# Patient Record
Sex: Female | Born: 1950
Health system: Southern US, Community
[De-identification: ages and names within clinical notes are randomized; demographics above are authoritative.]

## PROBLEM LIST (undated history)

## (undated) DIAGNOSIS — J40 Bronchitis, not specified as acute or chronic: Secondary | ICD-10-CM

## (undated) DIAGNOSIS — Z86018 Personal history of other benign neoplasm: Secondary | ICD-10-CM

## (undated) DIAGNOSIS — D219 Benign neoplasm of connective and other soft tissue, unspecified: Secondary | ICD-10-CM

## (undated) DIAGNOSIS — A64 Unspecified sexually transmitted disease: Secondary | ICD-10-CM

## (undated) DIAGNOSIS — T148XXA Other injury of unspecified body region, initial encounter: Secondary | ICD-10-CM

## (undated) DIAGNOSIS — H269 Unspecified cataract: Secondary | ICD-10-CM

## (undated) DIAGNOSIS — D649 Anemia, unspecified: Secondary | ICD-10-CM

## (undated) DIAGNOSIS — A159 Respiratory tuberculosis unspecified: Secondary | ICD-10-CM

## (undated) DIAGNOSIS — Z8619 Personal history of other infectious and parasitic diseases: Secondary | ICD-10-CM

## (undated) DIAGNOSIS — F419 Anxiety disorder, unspecified: Secondary | ICD-10-CM

## (undated) HISTORY — DX: Anemia, unspecified: D64.9

## (undated) HISTORY — DX: Personal history of other benign neoplasm: Z86.018

## (undated) HISTORY — PX: EYE SURGERY: SHX253

## (undated) HISTORY — DX: Personal history of other infectious and parasitic diseases: Z86.19

## (undated) HISTORY — DX: Unspecified sexually transmitted disease: A64

## (undated) HISTORY — DX: Unspecified cataract: H26.9

## (undated) HISTORY — DX: Anxiety disorder, unspecified: F41.9

## (undated) HISTORY — DX: Respiratory tuberculosis unspecified: A15.9

## (undated) HISTORY — DX: Benign neoplasm of connective and other soft tissue, unspecified: D21.9

## (undated) HISTORY — DX: Bronchitis, not specified as acute or chronic: J40

---

## 1997-08-29 ENCOUNTER — Emergency Department (HOSPITAL_COMMUNITY): Admission: EM | Admit: 1997-08-29 | Discharge: 1997-08-29 | Payer: Self-pay | Admitting: Emergency Medicine

## 1997-09-06 ENCOUNTER — Emergency Department (HOSPITAL_COMMUNITY): Admission: EM | Admit: 1997-09-06 | Discharge: 1997-09-06 | Payer: Self-pay | Admitting: Emergency Medicine

## 1997-12-30 ENCOUNTER — Ambulatory Visit: Admission: RE | Admit: 1997-12-30 | Discharge: 1997-12-30 | Payer: Self-pay | Admitting: Internal Medicine

## 1998-05-14 HISTORY — PX: OTHER SURGICAL HISTORY: SHX169

## 1998-10-19 ENCOUNTER — Other Ambulatory Visit: Admission: RE | Admit: 1998-10-19 | Discharge: 1998-10-19 | Payer: Self-pay | Admitting: Obstetrics and Gynecology

## 1999-01-06 ENCOUNTER — Encounter (INDEPENDENT_AMBULATORY_CARE_PROVIDER_SITE_OTHER): Payer: Self-pay | Admitting: Specialist

## 1999-01-06 ENCOUNTER — Ambulatory Visit (HOSPITAL_COMMUNITY): Admission: RE | Admit: 1999-01-06 | Discharge: 1999-01-06 | Payer: Self-pay | Admitting: Obstetrics and Gynecology

## 1999-04-29 ENCOUNTER — Emergency Department (HOSPITAL_COMMUNITY): Admission: EM | Admit: 1999-04-29 | Discharge: 1999-04-29 | Payer: Self-pay | Admitting: Emergency Medicine

## 1999-05-02 ENCOUNTER — Encounter: Admission: RE | Admit: 1999-05-02 | Discharge: 1999-05-10 | Payer: Self-pay | Admitting: Geriatric Medicine

## 1999-11-01 ENCOUNTER — Other Ambulatory Visit: Admission: RE | Admit: 1999-11-01 | Discharge: 1999-11-01 | Payer: Self-pay | Admitting: Obstetrics and Gynecology

## 1999-12-12 ENCOUNTER — Encounter: Payer: Self-pay | Admitting: Internal Medicine

## 1999-12-12 ENCOUNTER — Encounter: Admission: RE | Admit: 1999-12-12 | Discharge: 1999-12-12 | Payer: Self-pay | Admitting: Internal Medicine

## 2000-05-22 ENCOUNTER — Encounter: Admission: RE | Admit: 2000-05-22 | Discharge: 2000-05-22 | Payer: Self-pay | Admitting: *Deleted

## 2000-11-19 ENCOUNTER — Other Ambulatory Visit: Admission: RE | Admit: 2000-11-19 | Discharge: 2000-11-19 | Payer: Self-pay | Admitting: Obstetrics and Gynecology

## 2000-12-12 ENCOUNTER — Encounter: Payer: Self-pay | Admitting: Internal Medicine

## 2000-12-12 ENCOUNTER — Encounter: Admission: RE | Admit: 2000-12-12 | Discharge: 2000-12-12 | Payer: Self-pay | Admitting: Internal Medicine

## 2001-05-14 HISTORY — PX: FRACTURE SURGERY: SHX138

## 2001-08-29 ENCOUNTER — Encounter: Admission: RE | Admit: 2001-08-29 | Discharge: 2001-09-09 | Payer: Self-pay | Admitting: Internal Medicine

## 2001-10-28 ENCOUNTER — Inpatient Hospital Stay (HOSPITAL_COMMUNITY): Admission: AC | Admit: 2001-10-28 | Discharge: 2001-10-29 | Payer: Self-pay | Admitting: *Deleted

## 2001-10-28 ENCOUNTER — Encounter: Payer: Self-pay | Admitting: *Deleted

## 2001-10-28 ENCOUNTER — Encounter: Payer: Self-pay | Admitting: Orthopedic Surgery

## 2001-11-11 ENCOUNTER — Encounter: Payer: Self-pay | Admitting: Orthopedic Surgery

## 2001-11-12 ENCOUNTER — Inpatient Hospital Stay (HOSPITAL_COMMUNITY): Admission: RE | Admit: 2001-11-12 | Discharge: 2001-11-17 | Payer: Self-pay | Admitting: Orthopedic Surgery

## 2001-11-12 ENCOUNTER — Encounter: Payer: Self-pay | Admitting: Orthopedic Surgery

## 2001-12-30 ENCOUNTER — Other Ambulatory Visit: Admission: RE | Admit: 2001-12-30 | Discharge: 2001-12-30 | Payer: Self-pay | Admitting: Obstetrics and Gynecology

## 2002-01-08 ENCOUNTER — Encounter: Payer: Self-pay | Admitting: Obstetrics and Gynecology

## 2002-01-08 ENCOUNTER — Ambulatory Visit (HOSPITAL_COMMUNITY): Admission: RE | Admit: 2002-01-08 | Discharge: 2002-01-08 | Payer: Self-pay | Admitting: Obstetrics and Gynecology

## 2003-04-27 ENCOUNTER — Ambulatory Visit (HOSPITAL_COMMUNITY): Admission: RE | Admit: 2003-04-27 | Discharge: 2003-04-27 | Payer: Self-pay | Admitting: Obstetrics and Gynecology

## 2004-04-10 ENCOUNTER — Ambulatory Visit (HOSPITAL_COMMUNITY): Admission: RE | Admit: 2004-04-10 | Discharge: 2004-04-10 | Payer: Self-pay | Admitting: Gastroenterology

## 2004-05-16 ENCOUNTER — Ambulatory Visit (HOSPITAL_COMMUNITY): Admission: RE | Admit: 2004-05-16 | Discharge: 2004-05-16 | Payer: Self-pay | Admitting: Obstetrics and Gynecology

## 2005-07-18 ENCOUNTER — Ambulatory Visit (HOSPITAL_COMMUNITY): Admission: RE | Admit: 2005-07-18 | Discharge: 2005-07-18 | Payer: Self-pay | Admitting: Obstetrics and Gynecology

## 2006-08-07 ENCOUNTER — Ambulatory Visit (HOSPITAL_COMMUNITY): Admission: RE | Admit: 2006-08-07 | Discharge: 2006-08-07 | Payer: Self-pay | Admitting: Obstetrics and Gynecology

## 2007-08-28 ENCOUNTER — Ambulatory Visit (HOSPITAL_COMMUNITY): Admission: RE | Admit: 2007-08-28 | Discharge: 2007-08-28 | Payer: Self-pay | Admitting: Obstetrics and Gynecology

## 2008-09-04 ENCOUNTER — Emergency Department (HOSPITAL_COMMUNITY): Admission: EM | Admit: 2008-09-04 | Discharge: 2008-09-04 | Payer: Self-pay | Admitting: Family Medicine

## 2008-10-13 ENCOUNTER — Ambulatory Visit (HOSPITAL_COMMUNITY): Admission: RE | Admit: 2008-10-13 | Discharge: 2008-10-13 | Payer: Self-pay | Admitting: Obstetrics and Gynecology

## 2009-08-12 ENCOUNTER — Ambulatory Visit: Payer: Self-pay | Admitting: Internal Medicine

## 2009-09-15 ENCOUNTER — Ambulatory Visit: Payer: Self-pay | Admitting: Internal Medicine

## 2010-03-13 ENCOUNTER — Ambulatory Visit: Payer: Self-pay | Admitting: Internal Medicine

## 2010-03-16 ENCOUNTER — Ambulatory Visit (HOSPITAL_COMMUNITY): Admission: RE | Admit: 2010-03-16 | Discharge: 2010-03-16 | Payer: Self-pay | Admitting: Geriatric Medicine

## 2010-03-28 ENCOUNTER — Encounter: Admission: RE | Admit: 2010-03-28 | Discharge: 2010-03-28 | Payer: Self-pay | Admitting: Obstetrics and Gynecology

## 2010-04-25 ENCOUNTER — Encounter (INDEPENDENT_AMBULATORY_CARE_PROVIDER_SITE_OTHER): Payer: Self-pay | Admitting: *Deleted

## 2010-04-25 LAB — CONVERTED CEMR LAB
Albumin: 4.4 g/dL (ref 3.5–5.2)
Alkaline Phosphatase: 48 units/L (ref 39–117)
Basophils Relative: 0 % (ref 0–1)
CO2: 27 meq/L (ref 19–32)
Chloride: 103 meq/L (ref 96–112)
Glucose, Bld: 87 mg/dL (ref 70–99)
Lymphocytes Relative: 44 % (ref 12–46)
Lymphs Abs: 2.1 10*3/uL (ref 0.7–4.0)
Monocytes Relative: 12 % (ref 3–12)
Neutro Abs: 2 10*3/uL (ref 1.7–7.7)
Neutrophils Relative %: 41 % — ABNORMAL LOW (ref 43–77)
Potassium: 3.9 meq/L (ref 3.5–5.3)
RBC: 4.43 M/uL (ref 3.87–5.11)
Rhuematoid fact SerPl-aCnc: <20 intl units/mL (ref 0–20)
Sodium: 139 meq/L (ref 135–145)
Total Protein: 7.3 g/dL (ref 6.0–8.3)
WBC: 4.9 10*3/uL (ref 4.0–10.5)

## 2010-06-03 ENCOUNTER — Encounter: Payer: Self-pay | Admitting: Obstetrics and Gynecology

## 2010-06-04 ENCOUNTER — Encounter: Payer: Self-pay | Admitting: Obstetrics and Gynecology
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
## 2010-09-29 NOTE — Op Note (Signed)
Emily [name], Emily [name]                ACCOUNT NO.:  [id number]   MEDICAL RECORD NO.:  [id number]          PATIENT TYPE:  AMB   LOCATION:  ENDO                         FACILITY:  [facility]   PHYSICIAN:  Danise Edge, M.D.   DATE OF BIRTH:  [birth year]   DATE OF PROCEDURE:  04/10/2004  DATE OF DISCHARGE:                                 OPERATIVE REPORT   PROCEDURE PERFORMED:  Colonoscopy.   ENDOSCOPIST:  Charolett Bumpers, M.D.   INDICATIONS FOR PROCEDURE:  Ms. Emily [name] is a 60 year old female born  Jan 26, 1951.  Ms. Emily [name] is scheduled to undergo her first screening  colonoscopy with polypectomy to prevent colon cancer.   PREMEDICATION:  Versed 4 mg, Demerol 50 mg.   DESCRIPTION OF PROCEDURE:  After obtaining informed consent, the patient was  placed in the left lateral decubitus position.  I administered intravenous  Demerol and intravenous Versed to achieve conscious sedation for the  procedure.  The patient's blood pressure, oxygen saturations and cardiac  rhythm were monitored throughout the procedure and documented in the medical  record.   Anal inspection was normal.  Digital rectal exam was normal.  The pediatric  Olympus adjustable colonoscope was introduced into the rectum and advanced  to the cecum.  Colonic preparation for the exam today was excellent.   Rectum:  Normal.   Sigmoid colon and descending colon:  Normal.   Splenic flexure:  Normal.   Transverse colon:  Normal.   Hepatic flexure:  Normal.   Ascending colon:  Normal.   Cecum and ileocecal valve:  Normal.   ASSESSMENT:  Normal proctocolonoscopy to the cecum.  No endoscopic evidence  for the presence of colorectal neoplasia.      MJ/MEDQ  D:  04/10/2004  T:  04/10/2004  Job:  [id number]   cc:   Georgann Housekeeper, MD  [address]  [place]  [place] [address]  Fax: [phone]   Maxie Better, M.D.  [address]  [place]  [place] [address]  Fax: [phone]
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
## 2010-11-13 ENCOUNTER — Inpatient Hospital Stay (INDEPENDENT_AMBULATORY_CARE_PROVIDER_SITE_OTHER)
Admission: RE | Admit: 2010-11-13 | Discharge: 2010-11-13 | Disposition: A | Discharge status: Home / Self Care | Payer: Self-pay | Source: Ambulatory Visit | Attending: Family Medicine | Admitting: Family Medicine

## 2010-11-13 ENCOUNTER — Emergency Department (HOSPITAL_COMMUNITY): Payer: Self-pay

## 2010-11-13 ENCOUNTER — Observation Stay (HOSPITAL_COMMUNITY)
Admission: EM | Admit: 2010-11-13 | Discharge: 2010-11-14 | Disposition: A | Discharge status: Home / Self Care | Payer: Self-pay | Attending: Emergency Medicine | Admitting: Emergency Medicine

## 2010-11-13 DIAGNOSIS — R079 Chest pain, unspecified: Secondary | ICD-10-CM

## 2010-11-13 LAB — POCT I-STAT, CHEM 8
BUN: 23 mg/dL (ref 6–23)
Calcium, Ion: 1.09 mmol/L — ABNORMAL LOW (ref 1.12–1.32)
TCO2: 20 mmol/L (ref 0–100)

## 2010-11-13 LAB — CK TOTAL AND CKMB (NOT AT ARMC): CK, MB: 3.2 ng/mL (ref 0.3–4.0)

## 2010-11-13 LAB — CBC
MCH: 27.4 pg (ref 26.0–34.0)
MCV: 81 fL (ref 78.0–100.0)
Platelets: 231 10*3/uL (ref 150–400)
RDW: 14.2 % (ref 11.5–15.5)
WBC: 5.5 10*3/uL (ref 4.0–10.5)

## 2010-11-14 DIAGNOSIS — R072 Precordial pain: Secondary | ICD-10-CM

## 2010-11-16 ENCOUNTER — Inpatient Hospital Stay (INDEPENDENT_AMBULATORY_CARE_PROVIDER_SITE_OTHER)
Admission: RE | Admit: 2010-11-16 | Discharge: 2010-11-16 | Disposition: A | Discharge status: Home / Self Care | Payer: Self-pay | Source: Ambulatory Visit | Attending: Family Medicine | Admitting: Family Medicine

## 2010-11-16 ENCOUNTER — Ambulatory Visit (INDEPENDENT_AMBULATORY_CARE_PROVIDER_SITE_OTHER): Payer: Self-pay

## 2010-11-16 DIAGNOSIS — M79609 Pain in unspecified limb: Secondary | ICD-10-CM

## 2010-12-11 ENCOUNTER — Other Ambulatory Visit: Payer: Self-pay | Admitting: Family Medicine

## 2010-12-11 ENCOUNTER — Other Ambulatory Visit (HOSPITAL_COMMUNITY): Payer: Self-pay | Admitting: Family Medicine

## 2010-12-11 DIAGNOSIS — Z1231 Encounter for screening mammogram for malignant neoplasm of breast: Secondary | ICD-10-CM

## 2010-12-15 ENCOUNTER — Ambulatory Visit (HOSPITAL_COMMUNITY): Payer: Self-pay

## 2011-01-16 ENCOUNTER — Other Ambulatory Visit (HOSPITAL_COMMUNITY): Payer: Self-pay | Admitting: Family Medicine

## 2011-01-16 DIAGNOSIS — Z780 Asymptomatic menopausal state: Secondary | ICD-10-CM

## 2011-03-19 ENCOUNTER — Ambulatory Visit (HOSPITAL_COMMUNITY): Payer: Self-pay

## 2011-03-19 ENCOUNTER — Ambulatory Visit (HOSPITAL_COMMUNITY)
Admission: RE | Admit: 2011-03-19 | Discharge: 2011-03-19 | Disposition: A | Discharge status: Home / Self Care | Payer: Self-pay | Source: Ambulatory Visit | Attending: Family Medicine | Admitting: Family Medicine

## 2011-03-19 DIAGNOSIS — Z1231 Encounter for screening mammogram for malignant neoplasm of breast: Secondary | ICD-10-CM

## 2011-03-19 DIAGNOSIS — Z13820 Encounter for screening for osteoporosis: Secondary | ICD-10-CM | POA: Insufficient documentation

## 2011-03-19 DIAGNOSIS — Z780 Asymptomatic menopausal state: Secondary | ICD-10-CM

## 2011-05-25 ENCOUNTER — Ambulatory Visit: Payer: Self-pay | Admitting: Physical Therapy

## 2011-06-01 ENCOUNTER — Ambulatory Visit: Payer: Self-pay

## 2011-06-04 ENCOUNTER — Ambulatory Visit: Payer: Self-pay | Attending: Family Medicine | Admitting: Physical Therapy

## 2011-06-04 DIAGNOSIS — IMO0001 Reserved for inherently not codable concepts without codable children: Secondary | ICD-10-CM | POA: Insufficient documentation

## 2011-06-04 DIAGNOSIS — R5381 Other malaise: Secondary | ICD-10-CM | POA: Insufficient documentation

## 2011-06-04 DIAGNOSIS — M25549 Pain in joints of unspecified hand: Secondary | ICD-10-CM | POA: Insufficient documentation

## 2011-06-07 ENCOUNTER — Ambulatory Visit: Payer: Self-pay | Admitting: Physical Therapy

## 2011-06-12 ENCOUNTER — Ambulatory Visit: Payer: Self-pay | Admitting: Physical Therapy

## 2011-06-14 ENCOUNTER — Ambulatory Visit: Payer: Self-pay | Admitting: Physical Therapy

## 2011-06-19 ENCOUNTER — Ambulatory Visit: Payer: Self-pay | Attending: Family Medicine | Admitting: Physical Therapy

## 2011-06-19 DIAGNOSIS — R5381 Other malaise: Secondary | ICD-10-CM | POA: Insufficient documentation

## 2011-06-19 DIAGNOSIS — IMO0001 Reserved for inherently not codable concepts without codable children: Secondary | ICD-10-CM | POA: Insufficient documentation

## 2011-06-19 DIAGNOSIS — M25549 Pain in joints of unspecified hand: Secondary | ICD-10-CM | POA: Insufficient documentation

## 2011-06-21 ENCOUNTER — Ambulatory Visit: Payer: Self-pay | Admitting: Physical Therapy

## 2011-06-25 ENCOUNTER — Encounter: Payer: Self-pay | Admitting: Physical Therapy

## 2011-06-27 ENCOUNTER — Ambulatory Visit: Payer: Self-pay | Admitting: Physical Therapy

## 2011-06-29 ENCOUNTER — Ambulatory Visit: Payer: Self-pay | Admitting: Physical Therapy

## 2011-07-03 ENCOUNTER — Ambulatory Visit: Payer: Self-pay | Admitting: Physical Therapy

## 2011-07-05 ENCOUNTER — Ambulatory Visit: Payer: Self-pay | Admitting: Physical Therapy

## 2011-07-10 ENCOUNTER — Ambulatory Visit: Payer: Self-pay | Admitting: Physical Therapy

## 2011-07-12 ENCOUNTER — Ambulatory Visit: Payer: Self-pay | Admitting: Physical Therapy

## 2011-10-18 ENCOUNTER — Ambulatory Visit: Payer: Self-pay | Attending: Family Medicine | Admitting: Occupational Therapy

## 2011-10-18 DIAGNOSIS — IMO0001 Reserved for inherently not codable concepts without codable children: Secondary | ICD-10-CM | POA: Insufficient documentation

## 2011-10-18 DIAGNOSIS — M6281 Muscle weakness (generalized): Secondary | ICD-10-CM | POA: Insufficient documentation

## 2012-01-10 ENCOUNTER — Ambulatory Visit: Payer: Self-pay | Attending: Family Medicine | Admitting: Occupational Therapy

## 2012-01-10 DIAGNOSIS — IMO0001 Reserved for inherently not codable concepts without codable children: Secondary | ICD-10-CM | POA: Insufficient documentation

## 2012-01-10 DIAGNOSIS — M6281 Muscle weakness (generalized): Secondary | ICD-10-CM | POA: Insufficient documentation

## 2012-01-15 ENCOUNTER — Encounter: Payer: Self-pay | Admitting: Occupational Therapy

## 2012-01-17 ENCOUNTER — Ambulatory Visit: Payer: Self-pay | Attending: Family Medicine | Admitting: Occupational Therapy

## 2012-01-17 DIAGNOSIS — IMO0001 Reserved for inherently not codable concepts without codable children: Secondary | ICD-10-CM | POA: Insufficient documentation

## 2012-01-17 DIAGNOSIS — M6281 Muscle weakness (generalized): Secondary | ICD-10-CM | POA: Insufficient documentation

## 2012-01-22 ENCOUNTER — Ambulatory Visit: Payer: Self-pay | Admitting: Occupational Therapy

## 2012-01-24 ENCOUNTER — Encounter: Payer: Self-pay | Admitting: Occupational Therapy

## 2012-01-30 ENCOUNTER — Encounter: Payer: Self-pay | Admitting: *Deleted

## 2012-02-01 ENCOUNTER — Encounter: Payer: Self-pay | Admitting: *Deleted

## 2012-02-06 ENCOUNTER — Encounter (HOSPITAL_COMMUNITY): Payer: Self-pay | Admitting: Family Medicine

## 2012-02-06 ENCOUNTER — Emergency Department (HOSPITAL_COMMUNITY): Payer: Self-pay

## 2012-02-06 ENCOUNTER — Ambulatory Visit (HOSPITAL_COMMUNITY)
Admission: RE | Admit: 2012-02-06 | Discharge: 2012-02-06 | Disposition: A | Discharge status: Home / Self Care | Payer: Self-pay | Source: Ambulatory Visit | Attending: Family Medicine | Admitting: Family Medicine

## 2012-02-06 ENCOUNTER — Other Ambulatory Visit: Payer: Self-pay | Admitting: Family Medicine

## 2012-02-06 ENCOUNTER — Emergency Department (HOSPITAL_COMMUNITY)
Admission: EM | Admit: 2012-02-06 | Discharge: 2012-02-07 | Disposition: A | Discharge status: Home / Self Care | Payer: Self-pay | Attending: Emergency Medicine | Admitting: Emergency Medicine

## 2012-02-06 DIAGNOSIS — R7611 Nonspecific reaction to tuberculin skin test without active tuberculosis: Secondary | ICD-10-CM | POA: Insufficient documentation

## 2012-02-06 DIAGNOSIS — R911 Solitary pulmonary nodule: Secondary | ICD-10-CM | POA: Insufficient documentation

## 2012-02-06 LAB — CBC WITH DIFFERENTIAL/PLATELET
Basophils Relative: 0 % (ref 0–1)
Eosinophils Absolute: 0.1 10*3/uL (ref 0.0–0.7)
Lymphs Abs: 2.8 10*3/uL (ref 0.7–4.0)
MCH: 27.7 pg (ref 26.0–34.0)
Neutrophils Relative %: 31 % — ABNORMAL LOW (ref 43–77)
Platelets: 242 10*3/uL (ref 150–400)
RBC: 4.62 MIL/uL (ref 3.87–5.11)

## 2012-02-06 LAB — BASIC METABOLIC PANEL
GFR calc Af Amer: >90 mL/min (ref >90)
GFR calc non Af Amer: >90 mL/min (ref >90)
Glucose, Bld: 98 mg/dL (ref 70–99)
Potassium: 3.4 mEq/L — ABNORMAL LOW (ref 3.5–5.1)
Sodium: 137 mEq/L (ref 135–145)

## 2012-02-06 MED ORDER — IOHEXOL 300 MG/ML  SOLN
80.0000 mL | Freq: Once | INTRAMUSCULAR | Status: AC | PRN
  Administered 2012-02-06: 80 mL via INTRAVENOUS

## 2012-02-06 NOTE — ED Notes (Signed)
Patient not in the room  Taken to CT

## 2012-02-06 NOTE — ED Notes (Signed)
Pt reports she was told to come to ED to get a CT scan d/t a nodule found on her left lung. Pt reports having weight loss. Pt denies cough and night sweats.

## 2012-02-06 NOTE — ED Notes (Signed)
Pt placed on Airborne isolation, sign on door. Waiting for negative pressure room to become available.

## 2012-02-06 NOTE — ED Notes (Signed)
Pt sts she was sent here by her PCP for further evaluation of knot on left lung.

## 2012-02-06 NOTE — ED Provider Notes (Signed)
History     CSN: [id number]  Arrival date & time 02/06/12  1628   First MD Initiated Contact with Patient 02/06/12 2044      Chief Complaint  Patient presents with  . Lung Abcess    The patient is a 61 year old female with past medical history significant for positive PPD test as a child who presents to the emergency department for lung nodule seen on chest x-ray that was completed today. Patient states that her PCP recommending getting a chest x-ray for regular followup 5 months ago.  However patient was unable to do so until today. Chest x-ray showed 9.5 mm mass in the left upper lobe. Patient was contacted via phone PCP, and patient was instructed to have CT scan for further evaluation. Patient reports a 15 pound weight loss over the last 6 months. She denies cough, fevers, night sweats, skin rashes, or any other symptoms.   (Consider location/radiation/quality/duration/timing/severity/associated sxs/prior treatment) HPI  History reviewed. No pertinent past medical history.  History reviewed. No pertinent past surgical history.  History reviewed. No pertinent family history.  History  Substance Use Topics  . Smoking status: Never Smoker   . Smokeless tobacco: Not on file  . Alcohol Use: No    OB History    Grav Para Term Preterm Abortions TAB SAB Ect Mult Living                  Review of Systems  All other systems reviewed and are negative.    Allergies  Review of patient's allergies indicates no known allergies.  Home Medications   Current Outpatient Rx  Name Route Sig Dispense Refill  . CALCIUM ACETATE 667 MG PO CAPS Oral Take 667 mg by mouth daily.    . IRON PO Oral Take 1 tablet by mouth daily.    Marland Kitchen MAGNESIUM PO Oral Take 1 tablet by mouth daily.      BP 117/74  Pulse 78  Temp 98.6 F (37 C) (Oral)  Resp 16  SpO2 99%  Physical Exam  Constitutional: She is oriented to person, place, and time. She appears well-developed. No distress.  HENT:    Head: Normocephalic and atraumatic.  Right Ear: External ear normal.  Left Ear: External ear normal.  Nose: Nose normal.  Mouth/Throat: Oropharynx is clear and moist.  Eyes: Conjunctivae normal and EOM are normal. Pupils are equal, round, and reactive to light.  Neck: Normal range of motion. Neck supple.  Cardiovascular: Normal rate, regular rhythm, normal heart sounds and intact distal pulses.   Pulmonary/Chest: Effort normal and breath sounds normal.  Abdominal: Soft. Bowel sounds are normal.  Musculoskeletal: Normal range of motion. She exhibits no edema and no tenderness.  Neurological: She is alert and oriented to person, place, and time. She has normal reflexes.  Skin: Skin is warm and dry.  Psychiatric: She has a normal mood and affect.    ED Course  Procedures (including critical care time)  Labs Reviewed - No data to display Dg Chest 2 View  02/06/2012  *RADIOLOGY REPORT*  Clinical Data: Positive PPD  CHEST - 2 VIEW  Comparison: 11/13/2010  Findings: Normal heart size.  No pleural effusion or edema.  The lungs appear hyperinflated and there are coarsened interstitial markings noted bilaterally.  Indeterminate nodular density in the right upper lobe is identified.  This measures approximately 9.5 mm.  No airspace consolidation.  IMPRESSION:  1.  Left upper lobe nodular density is indeterminate measuring 9.5 mm.  Advise further  evaluation with CT of the chest. 2.  Chronic lung disease.   Original Report Authenticated By: Rosealee Albee, M.D.      1. Lung nodule       MDM   Patient is a 61 year old female with positive PPD skin test as a child who presents the emergency department due to left upper lobe mass seen on chest x-ray completed today. Upon arrival in the emergency department, patient noted to be afebrile and vital signs were within normal limits. On exam, patient was well appearing, normal heart/lung exam, and otherwise as above and noncontributory. Due to new mass  seen on chest x-ray, weight loss, no PCP follow up, and history of positive PPD skin test  It was felt that CT of chest with contrast was warranted to further evaluate mass.  Review of results showed no evidence of cavitary lesion.  Results did show lung nodule that because patient is low risk for lung cancer she will need follow up scan in 12 months.  CT read commends on possible bronchopneumonia but patient has no symptoms of pneumonia and thus no treatment was given.  Patient given information for PCP follow up and need for CT scan in 12 months.  She voiced understanding and discharged without acute events.          Johnney Ou, MD 02/07/12 [phone]

## 2012-02-07 NOTE — ED Provider Notes (Signed)
I have supervised the resident on the management of this patient and agree with the note above. I personally interviewed and examined the patient and my addendum is below.   Emily [name] is a 61 y.o. female here for lung nodule on cxr. She had a cxr that showed a mass in L upper lobe. No cough or night sweats. She is gradually loosing weight over the last 6 months. She is sent for CT for further eval. CT showed a lung nodule and possible pneumonia. She is asymptomatic and not coughing and no concern of active TB and pneumonia. We recommended that she follow up with a pcp and get repeat CT in a year and possibly set up for a biopsy.    Richardean Canal, MD 02/07/12 [phone]

## 2012-02-15 ENCOUNTER — Ambulatory Visit: Payer: Self-pay | Admitting: Family Medicine

## 2012-02-15 ENCOUNTER — Encounter: Payer: Self-pay | Admitting: Family Medicine

## 2012-02-15 VITALS — BP 112/70 | HR 75 | Temp 98.0°F | Resp 16 | Ht 62.0 in | Wt 126.8 lb

## 2012-02-15 DIAGNOSIS — R911 Solitary pulmonary nodule: Secondary | ICD-10-CM

## 2012-02-15 DIAGNOSIS — Z711 Person with feared health complaint in whom no diagnosis is made: Secondary | ICD-10-CM

## 2012-02-15 HISTORY — DX: Solitary pulmonary nodule: R91.1

## 2012-02-15 NOTE — Progress Notes (Signed)
Subjective:    Patient ID: Emily [name], female    DOB: [birth year], 61 y.o.   MRN: [id number]  HPI Ms. Peckenpaugh is a delightful 61 yo female who was a former pt of mine at [facility] who had a distant h/o +ppd.  She was having a cough and so I had given her an order for a CXR which she filed away and then found several mos later and so then had the CXR done after [facility] had closed.  The CXR returned showing a 9.88mm lung nodule and so I advised the pt that she have a chest CT w/ contrast which was completed last wk and showed a 5 mm nodule - thought to be of low risk - recommended f/u chest CT in 6-12 mos for high risk pts and 12 mos for low risk pts.  Ever since I informed her of the lung nodule on CXR, she has had the sensation of feeling like there something in her chest - feels heavy when she bends over. Was having a wk or two of cough w/ some sub fevers but resolved sev wks ago.  Also, did have a distant episode of CP for which she was evaluated in the ER but none further. In the past, has felt very fatigued for unknown reason but this has resolved now and her energy is good. No other complaints  Tuberculosis Risk Questionnaire  1. Were you born outside the [place] in one of the following parts of the world:    [place], [place], [place], [place] or [place]?  No  2. Have you traveled outside the [place] and lived for more than one month in one of the following parts of the world:  [place], [place], [place], [place] or [place]?  No  3. Do you have a compromised immune system such as from any of the following conditions:  HIV/AIDS, organ or bone marrow transplantation, diabetes, immunosuppressive   medicines (e.g. Prednisone, Remicaide), leukemia, lymphoma, cancer of the   head or neck, gastrectomy or jejunal bypass, end-stage renal disease (on   dialysis), or silicosis?  No    4. Have you ever done one of the following:    Used crack cocaine, injected illegal drugs,  worked or resided in jail or prison,   worked or resided at a homeless shelter, or worked as a Research scientist (physical sciences) in   direct contact with patients?  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  5. Have you ever been exposed to anyone with infectious tuberculosis?  Yes As a child, was treated but pt is unsure of daily treatment compliance (had 7 siblings)   Tuberculosis Symptom Questionnaire  Do you currently have any of the following symptoms?  1. Unexplained cough lasting more than 3 weeks? No  Unexplained fever lasting more than 3 weeks. No   3. Night Sweats (sweating that leaves the bedclothes and sheets wet)   No  4. Shortness of Breath No  5. Chest Pain No  6. Unintentional weight loss  Yes has lost 15-25 lbs in past sev yrs.  7. Unexplained fatigue (very tired for no reason) No  Review of Systems  Constitutional: Negative for fever, chills, diaphoresis, activity change, appetite change, fatigue and unexpected weight change.  Respiratory: Negative for cough, chest tightness, shortness of breath and wheezing.   Cardiovascular: Negative for chest pain.  Hematological: Negative for adenopathy. Does not bruise/bleed easily.  Psychiatric/Behavioral: Negative for confusion, dysphoric mood and agitation. The patient  is not nervous/anxious.       BP 112/70  Pulse 75  Temp 98 F (36.7 C) (Oral)  Resp 16  Ht 5\' 2"  (1.575 m)  Wt 126 lb 12.8 oz (57.516 kg)  BMI 23.19 kg/m2  SpO2 99% Objective:   Physical Exam  Constitutional: She is oriented to person, place, and time. She appears well-developed and well-nourished. No distress.  HENT:  Head: Normocephalic and atraumatic.  Right Ear: External ear normal.  Left Ear: External ear normal.  Eyes: Conjunctivae normal are normal. No scleral icterus.  Neck: Normal range of motion. Neck supple. No thyromegaly present.  Cardiovascular: Normal rate, regular rhythm, normal heart sounds and intact distal pulses.     Pulmonary/Chest: Effort normal and breath sounds normal. No respiratory distress.  Musculoskeletal: She exhibits no edema.  Lymphadenopathy:    She has no cervical adenopathy.  Neurological: She is alert and oriented to person, place, and time.  Skin: Skin is warm and dry. She is not diaphoretic. No erythema.  Psychiatric: She has a normal mood and affect. Her behavior is normal.          Assessment & Plan:   1. Incidental lung nodule, > 3mm and < 8mm - rec repeat chest CT w/ contrast around 02/2013 - pt understands and agrees.  2. Worried well

## 2012-03-10 ENCOUNTER — Other Ambulatory Visit: Payer: Self-pay | Admitting: Family Medicine

## 2012-03-10 DIAGNOSIS — Z1231 Encounter for screening mammogram for malignant neoplasm of breast: Secondary | ICD-10-CM

## 2012-03-28 ENCOUNTER — Ambulatory Visit (HOSPITAL_COMMUNITY)
Admission: RE | Admit: 2012-03-28 | Discharge: 2012-03-28 | Disposition: A | Discharge status: Home / Self Care | Payer: Self-pay | Source: Ambulatory Visit | Attending: Family Medicine | Admitting: Family Medicine

## 2012-03-28 DIAGNOSIS — Z1231 Encounter for screening mammogram for malignant neoplasm of breast: Secondary | ICD-10-CM

## 2012-04-29 ENCOUNTER — Encounter (HOSPITAL_COMMUNITY): Payer: Self-pay

## 2012-04-29 ENCOUNTER — Emergency Department (HOSPITAL_COMMUNITY)
Admission: EM | Admit: 2012-04-29 | Discharge: 2012-04-29 | Disposition: A | Discharge status: Home / Self Care | Payer: No Typology Code available for payment source | Source: Home / Self Care

## 2012-04-29 DIAGNOSIS — R911 Solitary pulmonary nodule: Secondary | ICD-10-CM

## 2012-04-29 DIAGNOSIS — R51 Headache: Secondary | ICD-10-CM

## 2012-04-29 MED ORDER — IBUPROFEN 200 MG PO TABS: 600.0000 mg | ORAL_TABLET | Freq: Three times a day (TID) | ORAL | Status: DC

## 2012-04-29 NOTE — ED Provider Notes (Addendum)
Patient Demographics  Emily [name], is a 61 y.o. female  XBJ:[id number]  HYQ:[id number]  DOB - 1950-06-17  Chief Complaint  Patient presents with  . Headache        Subjective:   Emily [name] today is here for to establish primary care. Patient complains of intermittent scalp pain that at times reproducible with palpation and mostly located on the top of her head and at times radiates down to her neck. She recently had a URI like symptoms which is resolved. Patient is worried that this headache is a manifestation of a stroke, she has been taking Advil with complete relief of the pain. She does not have fever, she does not have photophobia. She appears very anxious and claims to have had a lot of stressors at work. 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 Patient No chest pain, No abdominal pain - No Nausea, No new weakness tingling or numbness, No Cough - SOB.   Objective:    Filed Vitals:   04/29/12 1110  BP: 117/75  Pulse: 88  Temp: 98.3 F (36.8 C)  TempSrc: Oral  Resp: 19  SpO2: 99%     ALLERGIES:  No Known Allergies  PAST MEDICAL HISTORY: Lung nodule  PAST SURGICAL HISTORY: History reviewed. No pertinent past surgical history.  FAMILY HISTORY: Father-COPD Brother-hypertension  MEDICATIONS AT HOME: Prior to Admission medications   Medication Sig Start Date End Date Taking? Authorizing Provider  IRON PO Take 1 tablet by mouth daily.   Yes Historical Provider, MD  calcium acetate (PHOSLO) 667 MG capsule Take 667 mg by mouth daily.    Historical Provider, MD  Cyanocobalamin (VITAMIN B-12 PO) Take by mouth 3 (three) times a week.    Historical Provider, MD  MAGNESIUM PO Take 1 tablet by mouth daily.    Historical Provider, MD  Multiple Vitamins-Minerals (EYE VITAMINS) CAPS Take by mouth daily. For macular degenaration    Historical Provider, MD    REVIEW OF SYSTEMS:  Constitutional:   No   Fevers,  chills, fatigue.  HEENT:    No Sore throat,   Cardio-vascular: No chest pain,  Orthopnea, swelling in lower extremities, anasarca, palpitations  GI:  No abdominal pain, nausea, vomiting, diarrhea  Resp: No shortness of breath,  No coughing up of blood.No cough.No wheezing.  Skin:  no rash or lesions.  GU:  no dysuria, change in color of urine, no urgency or frequency.  No flank pain.  Musculoskeletal: No joint pain or swelling.  No decreased range of motion.  No back pain.  Psych: No change in mood or affect. No depression or anxiety.  No memory loss.   Exam  General appearance :Awake, alert, not in any distress. Speech Clear. Not toxic Looking HEENT: Atraumatic and Normocephalic, pupils equally reactive to light and accomodation. During my exam, scalp pain is not reproducible. Neck: supple, no JVD. No cervical lymphadenopathy.  Chest:Good air entry bilaterally, no added sounds  CVS: S1 S2 regular, no murmurs.  Abdomen: Bowel sounds present, Non tender and not distended with no gaurding, rigidity or rebound. Extremities: B/L Lower Ext shows no edema, both legs are warm to touch Neurology: Awake alert, and oriented X 3, CN II-XII intact, Non focal Skin:No Rash Wounds:N/A    Data Review   CBC No results found for this basename: WBC:5,HGB:5,HCT:5,PLT:5,MCV:5,MCH:5,MCHC:5,RDW:5,NEUTRABS:5,LYMPHSABS:5,MONOABS:5,EOSABS:5,BASOSABS:5,BANDABS:5,BANDSABD:5 in the last 168 hours  Chemistries   No results found for this basename: NA:5,K:5,CL:5,CO2:5,GLUCOSE:5,BUN:5,CREATININE:5,GFRCGP,:5,CALCIUM:5,MG:5,AST:5,ALT:5,ALKPHOS:5,BILITOT:5 in the  last 168 hours ------------------------------------------------------------------------------------------------------------------ No results found for this basename: HGBA1C:2 in the last 72 hours ------------------------------------------------------------------------------------------------------------------ No results found for this  basename: CHOL:2,HDL:2,LDLCALC:2,TRIG:2,CHOLHDL:2,LDLDIRECT:2 in the last 72 hours ------------------------------------------------------------------------------------------------------------------ No results found for this basename: TSH,T4TOTAL,FREET3,T3FREE,THYROIDAB in the last 72 hours ------------------------------------------------------------------------------------------------------------------ No results found for this basename: VITAMINB12:2,FOLATE:2,FERRITIN:2,TIBC:2,IRON:2,RETICCTPCT:2 in the last 72 hours  Coagulation profile  No results found for this basename: INR:5,PROTIME:5 in the last 168 hours    Assessment & Plan   Present on Admission:   Headache-? Post viral syndrome-continue with supportive care with as needed Tylenol and NSAIDs. I reassured the patient that this is unlikely to be a stroke. We will reassess her in a week, if she continues to have headaches we can do further workup and try other medications.  We will get routine labs prior to her next visit.  Long discussion with the patient, she is agreeable with this plan.  She does have a known lung nodule-her prior documentation-she needs a repeat CT of the chest approximately on 02/2013  Follow-up Information    Follow up with [facility]. Schedule an appointment as soon as possible for a visit in 7 days.          Maretta Bees, MD 04/29/12 1140  Shanker Levora Dredge, MD 04/29/12 1146

## 2012-04-29 NOTE — ED Notes (Signed)
C/o pain at top of head radiates down right side towards neck states started 9 days ago

## 2012-04-30 NOTE — ED Notes (Signed)
Notified patient of her dental appt. Scheduled for Thursday 05/01/12 @ [facility] @ 10:30

## 2012-07-31 ENCOUNTER — Ambulatory Visit: Payer: Self-pay | Admitting: Family Medicine

## 2012-07-31 VITALS — BP 96/58 | HR 100 | Temp 97.8°F | Resp 16 | Ht 63.0 in | Wt 128.0 lb

## 2012-07-31 NOTE — Progress Notes (Signed)
Subjective:    Patient ID: Emily [name], female    DOB: January 21, 1951, 62 y.o.   MRN: [id number] Chief Complaint  Patient presents with  . Chest Pain  . Follow-up    HPI She has been having some central pressure in her chest that hurts more with lifting and laying flat.  About 3 wks ago she was really stressed out due to family situations - was so bad she thought she was going to go to the ER but decreased on its own so she didn't have to.  She has been taking baby asa and it is improving.  Initially started when she started hyperventilating upon finding out some distressing financial info - her mother gave her sister a lot of $$ when pt has been traveling a lot to help her mother and her sister hasn't and her mother doesn't have much money.  Since that time, she has continued to have chest pains when she lifts anything, bends over, or lays down.  Does not come on with walking. When she goes to bed at night, pain will start and will continue until she falls asleep.  No congestion or cold, no cough.  No associated sxs.  No palpitations.  No radiation. Chest not tender to palp. This happened a yr ago during a similar situation when she was really stressed out and anxious about her ex-husband. She has noticed that the pain has continued to improve over the past sev wks as long as she does not talk to her family - avoiding all calls from her mother and sister and seems to be helping  Past Medical History  Diagnosis Date  . Anxiety   . Asthma   . Cataract   . Tuberculosis    Current Outpatient Prescriptions on File Prior to Visit  Medication Sig Dispense Refill  . calcium acetate (PHOSLO) 667 MG capsule Take 667 mg by mouth daily.      . Cyanocobalamin (VITAMIN B-12 PO) Take by mouth 3 (three) times a week.      Marland Kitchen ibuprofen (ADVIL) 200 MG tablet Take 3 tablets (600 mg total) by mouth 3 (three) times daily. Take for 3 days. Take with meals  30 tablet  0  . IRON PO Take 1 tablet by mouth daily.       Marland Kitchen MAGNESIUM PO Take 1 tablet by mouth daily.      . Multiple Vitamins-Minerals (EYE VITAMINS) CAPS Take by mouth daily. For macular degenaration       No current facility-administered medications on file prior to visit.   No Known Allergies  Review of Systems  HENT: Negative for congestion, sore throat, rhinorrhea, trouble swallowing and voice change.   Respiratory: Positive for chest tightness. Negative for cough and shortness of breath.   Cardiovascular: Positive for chest pain. Negative for palpitations and leg swelling.  Gastrointestinal: Negative for nausea, vomiting and abdominal pain.  Musculoskeletal: Positive for myalgias, back pain and arthralgias.  Psychiatric/Behavioral: Positive for sleep disturbance. Negative for dysphoric mood. The patient is nervous/anxious.       BP 96/58  Pulse 100  Temp(Src) 97.8 F (36.6 C) (Oral)  Resp 16  Ht 5\' 3"  (1.6 m)  Wt 128 lb (58.06 kg)  BMI 22.68 kg/m2  SpO2 97% Objective:   Physical Exam  Constitutional: She is oriented to person, place, and time. She appears well-developed and well-nourished. No distress.  HENT:  Head: Normocephalic and atraumatic.  Right Ear: External ear normal.  Left Ear: External ear normal.  Eyes: Conjunctivae are normal. No scleral icterus.  Neck: Normal range of motion. Neck supple. No thyromegaly present.  Cardiovascular: Normal rate, regular rhythm, normal heart sounds and intact distal pulses.   Pulmonary/Chest: Effort normal and breath sounds normal. No respiratory distress. She exhibits no tenderness and no bony tenderness.  Musculoskeletal: She exhibits no edema.  Lymphadenopathy:    She has no cervical adenopathy.  Neurological: She is alert and oriented to person, place, and time.  Skin: Skin is warm and dry. She is not diaphoretic. No erythema.  Psychiatric: She has a normal mood and affect. Her behavior is normal.      Assessment & Plan:  Chest pain, unspecified - Presumed to be due to  anxiety though also has sxs of MSK. Very atypical for cardiac. Pt has had a complete negative cardiac w/u for similar sxs in the past and they always come on with stressful situations.  Discussed w/ pt that we could do a EKG to check her heart but she feels sure that it is anxiety so will continue monitoring and try breathing techniques, relaxation, avoid stressful people.  W/u minimized since pt is self pay and had prev normal w/u.  Incidental lung nodule, > 3mm and < 8mm - Plan: CT Chest W Contrast - Encouraged pt to wait for recheck of chest CT for 1 yr as the longer we wait before reimaging is likely to give [place] more info - either be more reassured about no change or have changed enough to warrant intervention. Placed future order for CT scan in 6 mos - Sept.  Offerred pt to repeat CXR as nodule was initially seen on CXR which would be a cheaper test if she was worried about the nodule but she declined as self-pay.  No orders of the defined types were placed in this encounter.

## 2012-08-05 ENCOUNTER — Ambulatory Visit: Payer: Self-pay | Admitting: Internal Medicine

## 2012-08-05 ENCOUNTER — Telehealth: Payer: Self-pay

## 2012-08-05 ENCOUNTER — Ambulatory Visit: Payer: Self-pay

## 2012-08-05 VITALS — BP 98/64 | HR 77 | Temp 97.9°F | Resp 18 | Ht 63.0 in | Wt 127.0 lb

## 2012-08-05 DIAGNOSIS — R079 Chest pain, unspecified: Secondary | ICD-10-CM

## 2012-08-05 DIAGNOSIS — R0789 Other chest pain: Secondary | ICD-10-CM

## 2012-08-05 DIAGNOSIS — Z733 Stress, not elsewhere classified: Secondary | ICD-10-CM

## 2012-08-05 DIAGNOSIS — F439 Reaction to severe stress, unspecified: Secondary | ICD-10-CM

## 2012-08-05 NOTE — Telephone Encounter (Signed)
Patient states she came in here and she was told she could get a CT Scan in September and she thinks now she may need the CT Scan done sooner due to the symptoms she is having.  Please call her at [phone].

## 2012-08-05 NOTE — Telephone Encounter (Signed)
Good advise

## 2012-08-05 NOTE — Progress Notes (Signed)
  Subjective:    Patient ID: Emily [name], female    DOB: Jun 28, 1950, 62 y.o.   MRN: [id number]  HPI Chest pain she thinks due to stress. Pain is substernal pressure , has had it before and dxed as stress with neg w/up. No sob, diaphoresis, radiation, fatigue, n or v.  Hx of pulmonary changes and is scheduled for f/up ct chest   Review of Systems     Objective:   Physical Exam  Vitals reviewed. Constitutional: She is oriented to person, place, and time. She appears well-developed and well-nourished.  Cardiovascular: Normal rate, regular rhythm and normal heart sounds.   Pulmonary/Chest: Effort normal and breath sounds normal.  Neurological: She is alert and oriented to person, place, and time. She exhibits normal muscle tone. Coordination normal.  Skin: Skin is warm and dry.   Appears well   [facility] reading (PRIMARY) by  Dr.Laurance Heide EKG normal.      Assessment & Plan:  F/up with Dr. Clelia Croft or rtc sooner if persists Stress control and tylenol Counseled

## 2012-08-05 NOTE — Patient Instructions (Signed)
Stress Management Stress is a state of physical or mental tension that often results from changes in your life or normal routine. Some common causes of stress are:  Death of a loved one.  Injuries or severe illnesses.  Getting fired or changing jobs.  Moving into a new home. Other causes may be:  Sexual problems.  Business or financial losses.  Taking on a large debt.  Regular conflict with someone at home or at work.  Constant tiredness from lack of sleep. It is not just bad things that are stressful. It may be stressful to:  Win the lottery.  Get married.  Buy a new car. The amount of stress that can be easily tolerated varies from person to person. Changes generally cause stress, regardless of the types of change. Too much stress can affect your health. It may lead to physical or emotional problems. Too little stress (boredom) may also become stressful. SUGGESTIONS TO REDUCE STRESS:  Talk things over with your family and friends. It often is helpful to share your concerns and worries. If you feel your problem is serious, you may want to get help from a professional counselor.  Consider your problems one at a time instead of lumping them all together. Trying to take care of everything at once may seem impossible. List all the things you need to do and then start with the most important one. Set a goal to accomplish 2 or 3 things each day. If you expect to do too many in a single day you will naturally fail, causing you to feel even more stressed.  Do not use alcohol or drugs to relieve stress. Although you may feel better for a short time, they do not remove the problems that caused the stress. They can also be habit forming.  Exercise regularly - at least 3 times per week. Physical exercise can help to relieve that "uptight" feeling and will relax you.  The shortest distance between despair and hope is often a good night's sleep.  Go to bed and get up on time allowing  yourself time for appointments without being rushed.  Take a short "time-out" period from any stressful situation that occurs during the day. Close your eyes and take some deep breaths. Starting with the muscles in your face, tense them, hold it for a few seconds, then relax. Repeat this with the muscles in your neck, shoulders, hand, stomach, back and legs.  Take good care of yourself. Eat a balanced diet and get plenty of rest.  Schedule time for having fun. Take a break from your daily routine to relax. HOME CARE INSTRUCTIONS   Call if you feel overwhelmed by your problems and feel you can no longer manage them on your own.  Return immediately if you feel like hurting yourself or someone else. Document Released: 10/24/2000 Document Revised: 07/23/2011 Document Reviewed: 06/16/2007 [facility] Patient Information 2013 [place], [place].

## 2012-08-05 NOTE — Telephone Encounter (Signed)
She states she is having chest tightness. She thinks this is from the lung nodules.I have advised her lung nodules are incidental and not associated with chest pains.  I have advised her to come here for this or go to the emergency room today now. She will come in now. She knows if it gets worse she is to go to emergency room. She states has currently eased. She wants to go to the grocery store for a friend in need of food. I advised her not to go to grocery store, she is to come here or go to the ER. To you FYI

## 2012-08-20 ENCOUNTER — Emergency Department (INDEPENDENT_AMBULATORY_CARE_PROVIDER_SITE_OTHER)
Admission: EM | Admit: 2012-08-20 | Discharge: 2012-08-20 | Disposition: A | Discharge status: Home / Self Care | Payer: No Typology Code available for payment source | Source: Home / Self Care

## 2012-08-20 ENCOUNTER — Encounter (HOSPITAL_COMMUNITY): Payer: Self-pay

## 2012-08-20 DIAGNOSIS — I10 Essential (primary) hypertension: Secondary | ICD-10-CM

## 2012-08-20 DIAGNOSIS — E119 Type 2 diabetes mellitus without complications: Secondary | ICD-10-CM

## 2012-08-20 DIAGNOSIS — E785 Hyperlipidemia, unspecified: Secondary | ICD-10-CM

## 2012-08-20 LAB — LIPID PANEL
Cholesterol: 209 mg/dL — ABNORMAL HIGH (ref 0–200)
HDL: 104 mg/dL (ref >39)
Total CHOL/HDL Ratio: 2 RATIO
Triglycerides: 36 mg/dL (ref <150)

## 2012-08-20 LAB — CBC
HCT: 37.2 % (ref 36.0–46.0)
Hemoglobin: 12.7 g/dL (ref 12.0–15.0)
MCH: 27.1 pg (ref 26.0–34.0)
MCHC: 34.1 g/dL (ref 30.0–36.0)

## 2012-08-20 LAB — BASIC METABOLIC PANEL
Chloride: 100 mEq/L (ref 96–112)
GFR calc Af Amer: >90 mL/min (ref >90)
Potassium: 3.7 mEq/L (ref 3.5–5.1)

## 2012-08-20 NOTE — ED Notes (Signed)
Patient states is a pre diabetic Needs bloodwork as well

## 2012-08-20 NOTE — ED Provider Notes (Signed)
History     CSN: [id number]  Arrival date & time 08/20/12  1008   None     Chief Complaint  Patient presents with  . Diabetes    (Consider location/radiation/quality/duration/timing/severity/associated sxs/prior treatment) HPI Pt is 62 yo female with HTN, HLD, Diabetes type II, presents for regular follow up and needs blood work today. She denies chest pain or shortness of breath, no recent sicknesses or hospitalizations, no abdominal or urinary concerns.  Past Medical History  Diagnosis Date  . Anxiety   . Asthma   . Cataract   . Tuberculosis     Past Surgical History  Procedure Laterality Date  . Fracture surgery      Family History  Problem Relation Age of Onset  . Diabetes Brother   . Stroke Maternal Grandmother   . Depression Paternal Grandmother     History  Substance Use Topics  . Smoking status: Never Smoker   . Smokeless tobacco: Not on file  . Alcohol Use: No    OB History   Grav Para Term Preterm Abortions TAB SAB Ect Mult Living                  Review of Systems Review of Systems  Constitutional: Negative for fever, chills, diaphoresis, activity change, appetite change and fatigue.  HENT: Negative for ear pain, nosebleeds, congestion, facial swelling, rhinorrhea, neck pain, neck stiffness and ear discharge.   Eyes: Negative for pain, discharge, redness, itching and visual disturbance.  Respiratory: Negative for cough, choking, chest tightness, shortness of breath, wheezing and stridor.   Cardiovascular: Negative for chest pain, palpitations and leg swelling.  Gastrointestinal: Negative for abdominal distention.  Genitourinary: Negative for dysuria, urgency, frequency, hematuria, flank pain, decreased urine volume, difficulty urinating and dyspareunia.  Musculoskeletal: Negative for back pain, joint swelling, arthralgias and gait problem.  Neurological: Negative for dizziness, tremors, seizures, syncope, facial asymmetry, speech difficulty,  weakness, light-headedness, numbness and headaches.  Hematological: Negative for adenopathy. Does not bruise/bleed easily.  Psychiatric/Behavioral: Negative for hallucinations, behavioral problems, confusion, dysphoric mood, decreased concentration and agitation.    Allergies  Review of patient's allergies indicates no known allergies.  Home Medications   Current Outpatient Rx  Name  Route  Sig  Dispense  Refill  . aspirin 81 MG tablet   Oral   Take 81 mg by mouth daily.         . calcium acetate (PHOSLO) 667 MG capsule   Oral   Take 667 mg by mouth daily.         . Cyanocobalamin (VITAMIN B-12 PO)   Oral   Take by mouth 3 (three) times a week.         Marland Kitchen ibuprofen (ADVIL) 200 MG tablet   Oral   Take 3 tablets (600 mg total) by mouth 3 (three) times daily. Take for 3 days. Take with meals   30 tablet   0   . IRON PO   Oral   Take 1 tablet by mouth daily.         Marland Kitchen MAGNESIUM PO   Oral   Take 1 tablet by mouth daily.         . Multiple Vitamins-Minerals (EYE VITAMINS) CAPS   Oral   Take by mouth daily. For macular degenaration           There were no vitals taken for this visit.  Physical Exam Physical Exam  Constitutional: Appears well-developed and well-nourished. No distress.  HENT: Normocephalic. External  right and left ear normal. Oropharynx is clear and moist.  Eyes: Conjunctivae and EOM are normal. PERRLA, no scleral icterus.  Neck: Normal ROM. Neck supple. No JVD. No tracheal deviation. No thyromegaly.  CVS: RRR, S1/S2 +, no murmurs, no gallops, no carotid bruit.  Pulmonary: Effort and breath sounds normal, no stridor, rhonchi, wheezes, rales.  Abdominal: Soft. BS +,  no distension, tenderness, rebound or guarding.  Musculoskeletal: Normal range of motion. No edema and no tenderness.  Lymphadenopathy: No lymphadenopathy noted, cervical, inguinal. Neuro: Alert. Normal reflexes, muscle tone coordination. No cranial nerve deficit. Skin: Skin is  warm and dry. No rash noted. Not diaphoretic. No erythema. No pallor.  Psychiatric: Normal mood and affect. Behavior, judgment, thought content normal.    ED Course  Procedures (including critical care time)  Labs Reviewed  BASIC METABOLIC PANEL  LIPID PANEL  HEMOGLOBIN A1C   No results found.   1. Diabetes - will check A1c and based on the final results will readjust the medication regimen   2. HTN (hypertension) - we discussed target blood pressure range, discussed importance of regular blood pressure checks and to call us back if hte numbers are > 140/90  3. HLD (hyperlipidemia) - will check BMP today       MDM  Diabetes, HTN, HLD - blood work today         Dorothea Ogle, MD 08/20/12 1123

## 2012-09-01 ENCOUNTER — Encounter: Payer: Self-pay | Admitting: Family Medicine

## 2012-09-19 ENCOUNTER — Emergency Department (INDEPENDENT_AMBULATORY_CARE_PROVIDER_SITE_OTHER)
Admission: EM | Admit: 2012-09-19 | Discharge: 2012-09-19 | Disposition: A | Discharge status: Home / Self Care | Payer: No Typology Code available for payment source | Source: Home / Self Care

## 2012-09-19 ENCOUNTER — Encounter (HOSPITAL_COMMUNITY): Payer: Self-pay

## 2012-09-19 DIAGNOSIS — R42 Dizziness and giddiness: Secondary | ICD-10-CM

## 2012-09-19 DIAGNOSIS — Z09 Encounter for follow-up examination after completed treatment for conditions other than malignant neoplasm: Secondary | ICD-10-CM

## 2012-09-19 DIAGNOSIS — Z131 Encounter for screening for diabetes mellitus: Secondary | ICD-10-CM

## 2012-09-19 NOTE — ED Notes (Signed)
Patient would like her A1C checked

## 2012-09-19 NOTE — ED Provider Notes (Signed)
History     CSN: [id number]  Arrival date & time 09/19/12  1251   First MD Initiated Contact with Patient 09/19/12 1314      No chief complaint on file.   (Consider location/radiation/quality/duration/timing/severity/associated sxs/prior treatment) HPI 62 year old female with past medical history of anxiety, iron deficiency anemia, asthma who presented to our clinic for followup of A1c level. A1c was 5.7 and April 2014. Patient reports occasional lightheadedness but no loss of consciousness. It happens intermittently and it  does not prevent her from performing activities of daily living. No chest pain, shortness of breath or palpitations. No abdominal pain, nausea or vomiting. No blood in the stool or urine.   Past Medical History  Diagnosis Date  . Anxiety   . Asthma   . Cataract   . Anemia     ?  . Bronchitis   . History of chicken pox   . Glucose intolerance (pre-diabetes)   . Glaucoma   . Hemorrhoid   . Chronic kidney disease   . Mitral valve prolapse   . Pneumonia   . Tuberculosis     exposed as a young child    Past Surgical History  Procedure Laterality Date  . Fracture surgery      Family History  Problem Relation Age of Onset  . Diabetes Brother   . Hypertension Brother   . Alcohol abuse Brother   . Mental illness Brother     nervous breakdown  . Stroke Maternal Grandmother   . Depression Paternal Grandmother   . Arthritis Mother   . COPD Father 31    decsd due to lack of oxygen  . Hypertension Father   . Alcohol abuse Father   . Gout Father   . Alcohol abuse Brother   . Hypertension Brother     History  Substance Use Topics  . Smoking status: Never Smoker   . Smokeless tobacco: Never Used  . Alcohol Use: Yes    OB History   Grav Para Term Preterm Abortions TAB SAB Ect Mult Living                  Review of Systems  Constitutional: Negative for fever, chills, diaphoresis, activity change, appetite change and fatigue.  HENT: Negative  for ear pain, nosebleeds, congestion, facial swelling, rhinorrhea, neck pain, neck stiffness and ear discharge.   Eyes: Negative for pain, discharge, redness, itching and visual disturbance.  Respiratory: Negative for cough, choking, chest tightness, shortness of breath, wheezing and stridor.   Cardiovascular: Negative for chest pain, palpitations and leg swelling.  Gastrointestinal: Negative for abdominal distention.  Genitourinary: Negative for dysuria, urgency, frequency, hematuria, flank pain, decreased urine volume, difficulty urinating and dyspareunia.  Musculoskeletal: Negative for back pain, joint swelling, arthralgias and gait problem.  Neurological: Negative for dizziness, tremors, seizures, syncope, facial asymmetry, speech difficulty, weakness, occasional light-headedness, negative for numbness and headaches.  Hematological: Negative for adenopathy. Does not bruise/bleed easily.  Psychiatric/Behavioral: Negative for hallucinations, behavioral problems, confusion, dysphoric mood, decreased concentration and agitation.    Allergies  Review of patient's allergies indicates no known allergies.  Home Medications   Current Outpatient Rx  Name  Route  Sig  Dispense  Refill  . aspirin 81 MG tablet   Oral   Take 81 mg by mouth daily.         . calcium acetate (PHOSLO) 667 MG capsule   Oral   Take 667 mg by mouth daily.         Marland Kitchen  Cyanocobalamin (VITAMIN B-12 PO)   Oral   Take by mouth 3 (three) times a week.         Marland Kitchen ibuprofen (ADVIL) 200 MG tablet   Oral   Take 3 tablets (600 mg total) by mouth 3 (three) times daily. Take for 3 days. Take with meals   30 tablet   0   . IRON PO   Oral   Take 1 tablet by mouth daily.         Marland Kitchen MAGNESIUM PO   Oral   Take 1 tablet by mouth daily.         . Multiple Vitamins-Minerals (EYE VITAMINS) CAPS   Oral   Take by mouth daily. For macular degenaration           BP 107/70  Pulse 86  Temp(Src) 98 F (36.7 C)  Resp  18  Wt 125 lb 2 oz (56.756 kg)  BMI 22.17 kg/m2  SpO2 100%  Physical Exam  Constitutional: Appears well-developed and well-nourished. No distress.  HENT: Normocephalic. External right and left ear normal. Oropharynx is clear and moist.  Eyes: Conjunctivae and EOM are normal. PERRLA, no scleral icterus.  Neck: Normal ROM. Neck supple. No JVD. No tracheal deviation. No thyromegaly.  CVS: RRR, S1/S2 +, no murmurs, no gallops, no carotid bruit.  Pulmonary: Effort and breath sounds normal, no stridor, rhonchi, wheezes, rales.  Abdominal: Soft. BS +,  no distension, tenderness, rebound or guarding.  Musculoskeletal: Normal range of motion. No edema and no tenderness.  Lymphadenopathy: No lymphadenopathy noted, cervical, inguinal. Neuro: Alert. Normal reflexes, muscle tone coordination. No cranial nerve deficit. Skin: Skin is warm and dry. No rash noted. Not diaphoretic. No erythema. No pallor.  Psychiatric: Normal mood and affect. Behavior, judgment, thought content normal.    ED Course  Procedures (including critical care time)  Labs Reviewed - No data to display No results found.   1. Follow up    - Patient is doing well. Reported occasional feeling of lightheadedness but no dizziness and no loss of consciousness. - A1c done 08/20/2012 is 5.7. I would not establish the diagnosis of diabetes at this time. We have talked extensively about diabetic diet, avoiding high carbohydrate meals, eating not more than 2 low sugar fruits in a day - Patient was instructed to come back in 3 months for another A1c blood work.   MDM  Followup visit for hemoglobin A1c check        Alison Murray, MD 09/19/12 1333

## 2012-09-19 NOTE — ED Notes (Signed)
Patient states lately has been feeling dizzy Also wants her HGB checked

## 2012-10-17 ENCOUNTER — Ambulatory Visit: Payer: Self-pay | Admitting: Family Medicine

## 2012-11-21 ENCOUNTER — Ambulatory Visit (INDEPENDENT_AMBULATORY_CARE_PROVIDER_SITE_OTHER): Payer: Self-pay | Admitting: Family Medicine

## 2012-11-21 ENCOUNTER — Encounter: Payer: Self-pay | Admitting: Family Medicine

## 2012-11-21 VITALS — BP 115/60 | HR 71 | Temp 98.5°F | Resp 16 | Ht 62.0 in | Wt 128.0 lb

## 2012-11-21 DIAGNOSIS — R7303 Prediabetes: Secondary | ICD-10-CM | POA: Insufficient documentation

## 2012-11-21 DIAGNOSIS — R7309 Other abnormal glucose: Secondary | ICD-10-CM

## 2012-11-21 DIAGNOSIS — N6019 Diffuse cystic mastopathy of unspecified breast: Secondary | ICD-10-CM

## 2012-11-21 DIAGNOSIS — R072 Precordial pain: Secondary | ICD-10-CM

## 2012-11-21 DIAGNOSIS — R079 Chest pain, unspecified: Secondary | ICD-10-CM

## 2012-11-21 DIAGNOSIS — F411 Generalized anxiety disorder: Secondary | ICD-10-CM

## 2012-11-21 HISTORY — DX: Prediabetes: R73.03

## 2012-11-21 HISTORY — DX: Diffuse cystic mastopathy of unspecified breast: N60.19

## 2012-11-21 HISTORY — DX: Generalized anxiety disorder: F41.1

## 2012-11-21 HISTORY — DX: Precordial pain: R07.2

## 2012-11-21 HISTORY — DX: Chest pain, unspecified: R07.9

## 2012-11-21 MED ORDER — LORAZEPAM 0.5 MG PO TABS: 0.5000 mg | ORAL_TABLET | Freq: Two times a day (BID) | ORAL | Status: DC | PRN

## 2012-11-21 NOTE — Patient Instructions (Addendum)
Stress Management Stress is a state of physical or mental tension that often results from changes in your life or normal routine. Some common causes of stress are:  Death of a loved one.  Injuries or severe illnesses.  Getting fired or changing jobs.  Moving into a new home. Other causes may be:  Sexual problems.  Business or financial losses.  Taking on a large debt.  Regular conflict with someone at home or at work.  Constant tiredness from lack of sleep. It is not just bad things that are stressful. It may be stressful to:  Win the lottery.  Get married.  Buy a new car. The amount of stress that can be easily tolerated varies from person to person. Changes generally cause stress, regardless of the types of change. Too much stress can affect your health. It may lead to physical or emotional problems. Too little stress (boredom) may also become stressful. SUGGESTIONS TO REDUCE STRESS:  Talk things over with your family and friends. It often is helpful to share your concerns and worries. If you feel your problem is serious, you may want to get help from a professional counselor.  Consider your problems one at a time instead of lumping them all together. Trying to take care of everything at once may seem impossible. List all the things you need to do and then start with the most important one. Set a goal to accomplish 2 or 3 things each day. If you expect to do too many in a single day you will naturally fail, causing you to feel even more stressed.  Do not use alcohol or drugs to relieve stress. Although you may feel better for a short time, they do not remove the problems that caused the stress. They can also be habit forming.  Exercise regularly - at least 3 times per week. Physical exercise can help to relieve that "uptight" feeling and will relax you.  The shortest distance between despair and hope is often a good night's sleep.  Go to bed and get up on time allowing  yourself time for appointments without being rushed.  Take a short "time-out" period from any stressful situation that occurs during the day. Close your eyes and take some deep breaths. Starting with the muscles in your face, tense them, hold it for a few seconds, then relax. Repeat this with the muscles in your neck, shoulders, hand, stomach, back and legs.  Take good care of yourself. Eat a balanced diet and get plenty of rest.  Schedule time for having fun. Take a break from your daily routine to relax. HOME CARE INSTRUCTIONS   Call if you feel overwhelmed by your problems and feel you can no longer manage them on your own.  Return immediately if you feel like hurting yourself or someone else. Document Released: 10/24/2000 Document Revised: 07/23/2011 Document Reviewed: 06/16/2007 [employer] Patient Information 2014 [employer].  

## 2012-11-26 NOTE — Progress Notes (Signed)
Subjective:    Patient ID: Emily [name], female    DOB: 01/08/1951, 62 y.o.   MRN: [id number] Chief Complaint  Patient presents with  . bloodwork  . pressure in chest    stress related  . sensation of breast leakage    HPI  Several wks ago, Emily [name] had additional episodes of chest pain - this was when she was visiting her son and having a lot of concern over how her daughter-in-law was treating her son.  Emily [name] has also recently quit her job as it was to stressful - long hours, no breaks, mean people.  She sought care at the [facility] several times but was confused about what lab testing she had done and would like to review it today.  Has occ tingling going through her breasts - like milk letting down. No tenderness, no nodules or masses, no skin changes or nipple discharge.  Past Medical History  Diagnosis Date  . Anxiety   . Asthma   . Cataract   . Anemia     ?  . Bronchitis   . History of chicken pox   . Glucose intolerance (pre-diabetes)   . Glaucoma   . Hemorrhoid   . Chronic kidney disease   . Mitral valve prolapse   . Pneumonia   . Tuberculosis     exposed as a young child   Current Outpatient Prescriptions on File Prior to Visit  Medication Sig Dispense Refill  . aspirin 81 MG tablet Take 81 mg by mouth daily.      . calcium acetate (PHOSLO) 667 MG capsule Take 667 mg by mouth daily.      . Cyanocobalamin (VITAMIN B-12 PO) Take by mouth 3 (three) times a week.      Marland Kitchen ibuprofen (ADVIL) 200 MG tablet Take 3 tablets (600 mg total) by mouth 3 (three) times daily. Take for 3 days. Take with meals  30 tablet  0  . IRON PO Take 1 tablet by mouth daily.      Marland Kitchen MAGNESIUM PO Take 1 tablet by mouth daily.      . Multiple Vitamins-Minerals (EYE VITAMINS) CAPS Take by mouth daily. For macular degenaration       No current facility-administered medications on file prior to visit.   No Known Allergies   Review of Systems  Constitutional: Negative for fever, chills,  diaphoresis, activity change, appetite change, fatigue and unexpected weight change.  Respiratory: Positive for chest tightness. Negative for shortness of breath.   Cardiovascular: Positive for chest pain. Negative for palpitations and leg swelling.  Neurological: Negative for dizziness, tremors, seizures, syncope, weakness, light-headedness and numbness.  Psychiatric/Behavioral: Negative for dysphoric mood. The patient is nervous/anxious.       BP 115/60  Pulse 71  Temp(Src) 98.5 F (36.9 C) (Oral)  Resp 16  Ht 5\' 2"  (1.575 m)  Wt 128 lb (58.06 kg)  BMI 23.41 kg/m2  SpO2 99% Objective:   Physical Exam  Constitutional: She is oriented to person, place, and time. She appears well-developed and well-nourished. No distress.  HENT:  Head: Normocephalic and atraumatic.  Right Ear: External ear normal.  Left Ear: External ear normal.  Eyes: Conjunctivae are normal. No scleral icterus.  Neck: Normal range of motion. Neck supple. No thyromegaly present.  Cardiovascular: Normal rate, regular rhythm, normal heart sounds and intact distal pulses.   Pulmonary/Chest: Effort normal and breath sounds normal. No respiratory distress.  Genitourinary: No breast swelling, tenderness, discharge or bleeding.  Musculoskeletal: She exhibits no edema.  Lymphadenopathy:    She has no cervical adenopathy.  Neurological: She is alert and oriented to person, place, and time.  Skin: Skin is warm and dry. She is not diaphoretic. No erythema.  Psychiatric: She has a normal mood and affect. Her behavior is normal.          Assessment & Plan:  Anxiety state, unspecified -Pt is going to avoid her triggers - family - and try to focus on things she enjoys, more exercise, meditation. Declines medication.  Chest pain - anxiety induced - encouraged pt to try an ativan that next time she develops the chest pain - if it is relieved, she can rest assured that it was only due to her anxiety and try to avoid the  anxiety-provoking situations. If the CP continues after the ativan - she should seek medical care here or at the ER immed.  Breast fibrocystic disorder, unspecified laterality - breast exam nml, she has been getting yearly mammograms which have been normal - last done 03/28/12 so she will have repeated in 4 mos time. If her sxs continue, worsen, or she develops any other sxs such as nipple d/c, skin changes, masses, focal tenderness, RTC immed for further eval.  Prediabetes - doing great w/ a1c 5.9 on 08/20/12 - just 3 mos prior.  Cont on diet and exercise.  Meds ordered this encounter  Medications  . cholecalciferol (VITAMIN D) 1000 UNITS tablet    Sig: Take 1,000 Units by mouth daily.  Marland Kitchen LORazepam (ATIVAN) 0.5 MG tablet    Sig: Take 1 tablet (0.5 mg total) by mouth 2 (two) times daily as needed for anxiety. And chest pain.    Dispense:  30 tablet    Refill:  1

## 2012-12-26 ENCOUNTER — Telehealth: Payer: Self-pay

## 2012-12-26 NOTE — Telephone Encounter (Signed)
Fine to have dr. cousins check her a1c if pt would like - dr. Cherly Hensen will be able to order this due to pt's history of pre-diabetes and does not need an order from me. However, if she could fax [place] a copy of the result for our records as well, that would be great. Total cholesterol number is technically high but only because her good cholesterol (hdl) is so fantastic so her "high" cholesterol is actually protecting her heart.

## 2012-12-26 NOTE — Telephone Encounter (Signed)
Please advise on A1c, I will call her about cholesterol

## 2012-12-26 NOTE — Telephone Encounter (Signed)
Have called her to advise. She has not seen Dr Cherly Hensen since 2011, states she will ask if they can do this. Advised her of cholesterol numbers.

## 2012-12-26 NOTE — Telephone Encounter (Signed)
Pt wants to talk with dr Clelia Croft about putting in either a referral or an order in to have her a1c checked by dr cousins her obgyn and she also has questions about her cholesteral  Dr Clelia Croft had told her cholesteral was good but when she got the copy of the lab result she states that it shows 209 H and would like to talk about that as well.  Best number [phone]

## 2013-01-09 ENCOUNTER — Telehealth: Payer: Self-pay

## 2013-01-09 NOTE — Telephone Encounter (Signed)
Pt was referred to have imaging done.  Needs to find out when and where that appointment is.  Please call at [phone].

## 2013-01-12 NOTE — Telephone Encounter (Signed)
Patient notified and stated she would call for appt time since wasn't listed.

## 2013-01-12 NOTE — Telephone Encounter (Signed)
Ct chest scheduled for 01/26/13 at [place] [facility].

## 2013-01-16 ENCOUNTER — Encounter: Payer: Self-pay | Admitting: Family Medicine

## 2013-01-16 ENCOUNTER — Ambulatory Visit (INDEPENDENT_AMBULATORY_CARE_PROVIDER_SITE_OTHER): Payer: Self-pay | Admitting: Family Medicine

## 2013-01-16 VITALS — BP 98/60 | HR 80 | Temp 98.0°F | Resp 16 | Ht 63.0 in | Wt 126.8 lb

## 2013-01-16 DIAGNOSIS — F411 Generalized anxiety disorder: Secondary | ICD-10-CM

## 2013-01-16 DIAGNOSIS — R079 Chest pain, unspecified: Secondary | ICD-10-CM

## 2013-01-16 DIAGNOSIS — R7303 Prediabetes: Secondary | ICD-10-CM

## 2013-01-16 DIAGNOSIS — R7309 Other abnormal glucose: Secondary | ICD-10-CM

## 2013-01-16 MED ORDER — LORAZEPAM 0.5 MG PO TABS: 0.5000 mg | ORAL_TABLET | Freq: Two times a day (BID) | ORAL | Status: DC | PRN

## 2013-01-16 NOTE — Patient Instructions (Addendum)
Diabetes, Type 2, Am I At Risk? Diabetes is a lasting (chronic) disease. In type 2 diabetes, the pancreas does not make enough insulin, and the body does not respond normally to the insulin that is made. This type of diabetes was also previously called adult onset diabetes. About 90% of all those who have diabetes have type 2. It usually occurs after the age of 34, but can occur at any age.  People develop type 2 diabetes because they do not use insulin properly. Eventually, the pancreas cannot make enough insulin for the body's needs. Over time, the amount of glucose (sugar) in the blood increases. RISK FACTORS  Overweight  the more weight you have, the more resistant your cells become to insulin.  Family history  you are more likely to get diabetes if a parent or sibling has diabetes.  Race certain races get diabetes more.  African Americans.  American Indians.  Asian Americans.  Hispanics.  Pacific Islander.  Inactive exercise helps control weight and helps your cells be more sensitive to insulin.  Gestational diabetes  some women develop diabetes while they are pregnant. This goes away when they deliver. However, they are 50-60% more likely to develop type 2 diabetes at a later time.  Having a baby over 9 pounds  a sign that you may have had gestational diabetes.  Age the risk of diabetes goes up as you get older, especially after age 18.  High blood pressure (hypertension). SYMPTOMS Many people have no signs or symptoms. Symptoms can be so mild that you might not even notice them. Some of these signs are:  Increased thirst.  Increased hunger.  Tiredness (fatigue).  Increased urination, especially at night.  Weight loss.  Blurred vision.  Sores that do not heal. WHO SHOULD BE TESTED?  Anyone 25 years or older, especially if overweight, should consider getting tested.  If you are younger than 56, overweight, and have one or more of the risk factors, you should  consider getting tested. DIAGNOSIS  Fasting blood glucose (FBS). Usually, 2 are done.  FBS 101-125 mg/dl is considered pre-diabetes.  FBS 126 mg/dl or greater is considered diabetes.  2 hour Oral Glucose Tolerance Test (OGTT). This test is preformed by first having you not eat or drink for several hours. You are then given something sweet to drink and your blood glucose is measured fasting, at one hour and 2 hours. This test tells how well you are able to handle sugars or carbohydrates.  Fasting: 60-100 mg/dl.  1 hour: less than 200 mg/dl.  2 hours: less than 140 mg/dl.  A1c A1c is a blood glucose test that gives and average of your blood glucose over 3 months. It is the accepted method to use to diagnose diabetes.  A1c 5.7-6.4% is considered pre-diabetes.  A1c 6.5% or greater is considered diabetes. WHAT DOES IT MEAN TO HAVE PRE-DIABETES? Pre-diabetes means you are at risk for getting type 2 diabetes. Your blood glucose is higher than normal, but not yet high enough to diagnose diabetes. The good news is, if you have pre-diabetes you can reduce the risk of getting diabetes and even return to normal blood glucose levels. With modest weight loss and moderate physical activity, you can delay or prevent type 2 diabetes.  PREVENTION You cannot do anything about race, age or family history, but you can lower your chances of getting diabetes. You can:   Exercise regularly and be active.  Reduce fat and calorie intake.  Make wise food  choices as much as you can.  Reduce your intake of salt and alcohol.  Maintain a reasonable weight.  Keep blood pressure in an acceptable range. Take medication if needed.  Not smoke.  Maintain an acceptable cholesterol level (HDL, LDL, Triglycerides). Take medication if needed. DOING MY PART: GETTING STARTED Making big changes in your life is hard, especially if you are faced with more than one change. You can make it easier by taking these  steps:  Make a plan to change behavior.  Decide exactly what you will do and when you will do it.  Plan what you need to get ready.  Think about what might prevent you from reaching your goals.  Find family and friends who will support and encourage you.  Decide how you will reward yourself when you do what you have planned.  Your doctor, dietitian, or counselor can help you make a plan. HERE ARE SOME OF THE AREAS YOU MAY WISH TO CHANGE TO REDUCE YOUR RISK OF DIABETES. If you are overweight or obese, choose sensible ways to get in shape. Even small amounts of weight loss, like 5-10 pounds, can help reduce the effects of insulin resistance and help blood glucose control. Diet  Avoid crash diets. Instead, eat less of the foods you usually have. Limit the amount of fat you eat.  Increase your physical activity. Aim for at least 30 minutes of exercise most days of the week.  Set a reasonable weight-loss goal, such as losing 1 pound a week. Aim for a long-term goal of losing 5-7% of your total body weight.  Make wise food choices most of the time.  What you eat has a big impact on your health. By making wise food choices, you can help control your body weight, blood pressure, and cholesterol.  Take a hard look at the serving sizes of the foods you eat. Reduce serving sizes of meat, desserts, and foods high in fat. Increase your intake of fruits and vegetables.  Limit your fat intake to about 25% of your total calories. For example, if your food choices add up to about 2,000 calories a day, try to eat no more than 56 grams of fat. Your caregiver or a dietitian can help you figure out how much fat to have. You can check food labels for fat content too.  You may also want to reduce the number of calories you have each day.  Keep a food log. Write down what you eat, how much you eat, and anything else that helps keep you on track.  When you meet your goal, reward yourself with a nonfood  item or activity. Exercise  Be physically active every day.  Keep and exercise log. Write down what exercise you did, for how long, and anything else that keeps you on track.  Regular exercise (like brisk walking) tackles several risk factors at once. It helps you lose weight, it keeps your cholesterol and blood pressure under control, and it helps your body use insulin. People who are physically active for 30 minutes a day, 5 days a week, reduced their risk of type 2 diabetes. If you are not very active, you should start slowly at first. Talk with your caregiver first about what kinds of exercise would be safe for you. Make a plan to increase your activity level with the goal of being active for at least 30 minutes a day, most days of the week.  Choose activities you enjoy. Here are some ways to  work extra activity into your daily routine:  Take the stairs rather than an elevator or escalator.  Park at the far end of the lot and walk.  Get off the bus a few stops early and walk the rest of the way.  Walk or bicycle instead of drive whenever you can. Medications Some people need medication to help control their blood pressure or cholesterol levels. If you do, take your medicines as directed. Ask your caregiver whether there are any medicines you can take to prevent type 2 diabetes. Document Released: 05/03/2003 Document Revised: 07/23/2011 Document Reviewed: 01/26/2009 [facility] Patient Information 2014 [place], [place]. Screening for Type 2 Diabetes Screening is a way to check for type 2 diabetes in people who do not have symptoms of the disease, but who may likely develop diabetes in the future. Diabetes can lead to serious health problems, but finding diabetes early allows for early treatment. DIABETES RISK FACTORS   Family history of diabetes.  Diseases of the pancreas.  Obesity or being overweight.  Certain racial or ethnic groups:  American [place].  Pacific  Islander.  Hispanic.  Asian.  African American.  High blood pressure (hypertension).  History of diabetes while pregnant (gestational diabetes).  Delivering a baby that weighed over 9 pounds.  Being inactive.  High cholesterol or triglycerides.  Age, especially over 75 years of age. WHO IS SCREENED Adults  Adults who have no risk factors and no symptoms should be screened starting at age 39. If the screening tests are normal, they should be repeated every 3 years.  Adults who do not have symptoms, but are overweight, should be screened before age 70.  Adults who do not have symptoms, but have 1 or more risk factors, should be screened.  Adults who have an A1c (3 month average of blood glucose) greater than 5.7% or who had an impaired glucose tolerance (IGT) or impaired fasting glucose (IFG) on a previous test should be screened.  Pregnant women with or without risk factors should be screened.  Women who gave birth and had gestational diabetes should be screened. This testing should be done 6 to 12 weeks after the child is born. Children or Adolescents  Children and adolescents should be screened for type 2 diabetes if they are overweight and have 2 of the following risk factors:  Having a family history of type 2 diabetes.  Being a member of a high risk race or ethnic group.  Having signs of insulin resistance or conditions associated with insulin resistance.  Having a mother who had gestational diabetes while pregnant with him or her.  Screening should start at age 2 or at the onset of puberty, whichever comes first. This should be repeated every 2 years. SCREENING In a screening, your caregiver may:  Ask questions about your overall health. This will include questions about the health of close family members, too.  Ask about any diabetes-like symptoms you may have.  Perform a physical exam.  Order some tests that may include:  A fasting plasma glucose  test. This measures the level of glucose in your blood. It is done after you have had nothing to eat but water (fasted) for 8 hours.  A random blood glucose test. This test is done without the need to fast.  An oral glucose tolerance test. This is a blood test done in 2 parts. First, a blood sample is taken after you have fasted. Then, another sample is taken after you drink a liquid that contains a  lot of sugar.  An A1c test. This test shows how much glucose has been in your blood over the past 2 to 3 months. Document Released: 02/24/2009 Document Revised: 07/23/2011 Document Reviewed: 12/06/2010 [facility] Patient Information 2014 [address], [place]. Complementary and Alternative Medical Therapies for Diabetes Complementary and alternative medicines are health care practices or products that are not always accepted as part of routine medicine. Complementary medicine is used along with routine medicine (medical therapy). Alternative medicine can sometimes be used instead of routine medicine. Some people use these methods to treat diabetes. While some of these therapies may be effective, others may not be. Some may even be harmful. Patients using these methods need to tell their caregiver. It is important to let your caregivers know what you are doing. Some of these therapies are discussed below. For more information, talk with your caregiver. THERAPIES Acupuncture Acupuncture is done by a professional who inserts needles into certain points on the skin. Some scientists believe that this triggers the release of the body's natural painkillers. It has been shown to relieve long-term (chronic) pain. This may help patients with painful nerve damage caused by diabetes. Biofeedback Biofeedback helps a person become more aware of the body's response to pain. It also helps you learn to deal with the pain. This alternative therapy focuses on relaxation and stress-reduction techniques. Thinking of peaceful mental  images (guided imagery) is one technique. Some people believe these images can ease their condition. MEDICATIONS Chromium Several studies report that chromium supplements may improve diabetes control. Chromium helps insulin improve its action. Research is not yet certain. Supplements have not been recommended or approved. Caution is needed if you have kidney (renal) problems. Ginseng There are several types of ginseng plants. American ginseng is used for diabetes studies. Those studies have shown some glucose-lowering effects. Those effects have been seen with fasting and after-meal blood glucose levels. They have also been seen in A1c levels (average blood glucose levels over a 13-month period). More long-term studies are needed before recommendations for use of ginseng can be made. Magnesium Experts have studied the relationship between magnesium and diabetes for many years. But it is not yet fully understood. Studies suggest that a low amount of magnesium may make blood glucose control worse in type 2 diabetes. Research also shows that a low amount may contribute to certain diabetes complications. One study showed that people who consume more magnesium had less risk of type 2 diabetes. Eating whole grains, nuts, and green leafy vegetables raises the magnesium level. Vanadium Vanadium is a compound found in tiny amounts in plants and animals. Early studies showed that vanadium improved blood glucose levels in animals with type 1 and type 2 diabetes. One study found that when given vanadium, those with diabetes were able to decrease their insulin dosage. Researchers still need to learn how it works in the body to discover any side effects, and to find safe dosages. Cinnamon There have been a couple of studies that seem to indicate cinnamon decreases insulin resistance and increases insulin production. By doing so, it may lower blood glucose. Exact doses are unknown, but it may work best when used in  combination with other diabetes medicines. Document Released: 02/25/2007 Document Revised: 07/23/2011 Document Reviewed: 03/10/2009 [facility] Patient Information 2014 [place], [place].

## 2013-01-16 NOTE — Progress Notes (Signed)
Subjective:    Patient ID: Emily [name], female    DOB: 05/14/1951, 62 y.o.   MRN: [id number] Chief Complaint  Patient presents with  . Follow-up    Hgb A1c    HPI  Here to get her a1c checked - she likes to keep an eye on it.  Has not have any further chest pain, her chest does get tight when she is doing heavy lifting or really stressed out/busy.  She never filled the lorazepam rx I gave her at last visit as she doesn't want or need an anxiety med. She thinks she prob lost the rx.  Nml bone density test 03/2011.  Past Medical History  Diagnosis Date  . Anxiety   . Asthma   . Cataract   . Anemia     ?  . Bronchitis   . History of chicken pox   . Glucose intolerance (pre-diabetes)   . Glaucoma   . Hemorrhoid   . Chronic kidney disease   . Mitral valve prolapse   . Pneumonia   . Tuberculosis     exposed as a young child   Current Outpatient Prescriptions on File Prior to Visit  Medication Sig Dispense Refill  . aspirin 81 MG tablet Take 81 mg by mouth daily.      . cholecalciferol (VITAMIN D) 1000 UNITS tablet Take 1,000 Units by mouth daily.      Marland Kitchen ibuprofen (ADVIL) 200 MG tablet Take 3 tablets (600 mg total) by mouth 3 (three) times daily. Take for 3 days. Take with meals  30 tablet  0  . IRON PO Take 1 tablet by mouth daily.      Marland Kitchen LORazepam (ATIVAN) 0.5 MG tablet Take 1 tablet (0.5 mg total) by mouth 2 (two) times daily as needed for anxiety. And chest pain.  30 tablet  1  . Multiple Vitamins-Minerals (EYE VITAMINS) CAPS Take by mouth daily. For macular degenaration      . calcium acetate (PHOSLO) 667 MG capsule Take 667 mg by mouth daily.      . Cyanocobalamin (VITAMIN B-12 PO) Take by mouth 3 (three) times a week.      Marland Kitchen MAGNESIUM PO Take 1 tablet by mouth daily.       No current facility-administered medications on file prior to visit.   No Known Allergies  Review of Systems  Constitutional: Positive for fatigue.  Respiratory: Positive for chest tightness.  Negative for shortness of breath.   Cardiovascular: Negative for chest pain, palpitations and leg swelling.  Musculoskeletal: Positive for arthralgias.  Neurological: Negative for weakness.  Psychiatric/Behavioral: The patient is not nervous/anxious.        Objective:   Physical Exam  Constitutional: She is oriented to person, place, and time. She appears well-developed and well-nourished. No distress.  HENT:  Head: Normocephalic and atraumatic.  Right Ear: External ear normal.  Left Ear: External ear normal.  Eyes: Conjunctivae are normal. No scleral icterus.  Neck: Normal range of motion. Neck supple. No thyromegaly present.  Cardiovascular: Normal rate, regular rhythm, normal heart sounds and intact distal pulses.   Pulmonary/Chest: Effort normal and breath sounds normal. No respiratory distress.  Musculoskeletal: She exhibits no edema.  Lymphadenopathy:    She has no cervical adenopathy.  Neurological: She is alert and oriented to person, place, and time.  Skin: Skin is warm and dry. She is not diaphoretic. No erythema.  Psychiatric: She has a normal mood and affect. Her behavior is normal.  Assessment & Plan:  Prediabetes - Plan: POCT glycosylated hemoglobin (Hb A1C) - continues to improve by tlc.  Chest pain - negative stress echo in 11/2010 - 2 yrs ago. Pt again encouraged to try lorazepam when she has the chest pain as in the past, many times have been connected to stress from her family or job - she does not want to use an anxiety medicine but encouraged her to fill the lorazepam - if she ever had the chest pain and was worried it was her heart, she could try the lorazepam - if it goes away than the CP is due to anxiety - if it doesn't then she needs another cardiac w/u.  Anxiety state, unspecified  Incidental lung nodule - pt has repeat chest CT sched for this month.

## 2013-01-26 ENCOUNTER — Ambulatory Visit
Admission: RE | Admit: 2013-01-26 | Discharge: 2013-01-26 | Disposition: A | Discharge status: Home / Self Care | Payer: No Typology Code available for payment source | Source: Ambulatory Visit | Attending: Family Medicine | Admitting: Family Medicine

## 2013-01-26 DIAGNOSIS — R911 Solitary pulmonary nodule: Secondary | ICD-10-CM

## 2013-01-26 MED ORDER — IOHEXOL 300 MG/ML  SOLN
75.0000 mL | Freq: Once | INTRAMUSCULAR | Status: AC | PRN
  Administered 2013-01-26: 75 mL via INTRAVENOUS

## 2013-01-28 ENCOUNTER — Encounter: Payer: Self-pay | Admitting: Family Medicine

## 2013-04-24 ENCOUNTER — Ambulatory Visit: Payer: Self-pay | Admitting: Family Medicine

## 2013-04-24 VITALS — BP 94/56 | HR 108 | Temp 97.1°F | Resp 18 | Ht 62.5 in | Wt 126.0 lb

## 2013-04-24 DIAGNOSIS — R7303 Prediabetes: Secondary | ICD-10-CM

## 2013-04-24 DIAGNOSIS — R7989 Other specified abnormal findings of blood chemistry: Secondary | ICD-10-CM

## 2013-04-24 NOTE — Patient Instructions (Signed)
Your hemoglobin a1c test is COMPLETELY NORMAL.  You are doing GREAT! Please do not let work and family stress you out to much - this is the biggest threat to your health.  Keep up the great work with children - thanks for all you do for your next generation!

## 2013-04-24 NOTE — Progress Notes (Signed)
Subjective:    Patient ID: Emily [name], female    DOB: 15-Mar-1951, 62 y.o.   MRN: [id number] Chief Complaint  Patient presents with  . Follow-up    DM   HPI   Was very stressed out at work - the person she is paired with has been highly stressful for her - and she handed in her letter of resignation after an episode of chest pressure - but her boss throwed it away and told her she would fix it.  Only working 4 hrs which has been good.  Working at [employer]. Only has the chest pressure when she is emotionally stressed out - never with physical exertion or other times.  Past Medical History  Diagnosis Date  . Anxiety   . Asthma   . Cataract   . Anemia     ?  . Bronchitis   . History of chicken pox   . Glucose intolerance (pre-diabetes)   . Glaucoma   . Hemorrhoid   . Chronic kidney disease   . Mitral valve prolapse   . Pneumonia   . Tuberculosis     exposed as a young child   Current Outpatient Prescriptions on File Prior to Visit  Medication Sig Dispense Refill  . aspirin 81 MG tablet Take 81 mg by mouth daily.      . calcium acetate (PHOSLO) 667 MG capsule Take 667 mg by mouth daily.      . cholecalciferol (VITAMIN D) 1000 UNITS tablet Take 1,000 Units by mouth daily.      . Cyanocobalamin (VITAMIN B-12 PO) Take by mouth 3 (three) times a week.      Marland Kitchen ibuprofen (ADVIL) 200 MG tablet Take 3 tablets (600 mg total) by mouth 3 (three) times daily. Take for 3 days. Take with meals  30 tablet  0  . IRON PO Take 1 tablet by mouth daily.      Marland Kitchen MAGNESIUM PO Take 1 tablet by mouth daily.      . Multiple Vitamins-Minerals (EYE VITAMINS) CAPS Take by mouth daily. For macular degenaration      . LORazepam (ATIVAN) 0.5 MG tablet Take 1 tablet (0.5 mg total) by mouth 2 (two) times daily as needed. For chest pain/tightness.  30 tablet  0   No current facility-administered medications on file prior to visit.   No Known Allergies  Review of Systems  Constitutional: Positive for fatigue.  Negative for fever, chills, diaphoresis and appetite change.  Eyes: Negative for visual disturbance.  Respiratory: Positive for chest tightness. Negative for cough and shortness of breath.   Cardiovascular: Negative for chest pain, palpitations and leg swelling.  Genitourinary: Negative for decreased urine volume.  Neurological: Negative for syncope and headaches.  Hematological: Does not bruise/bleed easily.  Psychiatric/Behavioral: Positive for agitation. Negative for behavioral problems, confusion and dysphoric mood. The patient is nervous/anxious.       BP 94/56  Pulse 108  Temp(Src) 97.1 F (36.2 C)  Resp 18  Ht 5' 2.5" (1.588 m)  Wt 126 lb (57.153 kg)  BMI 22.66 kg/m2 Objective:   Physical Exam  Constitutional: She is oriented to person, place, and time. She appears well-developed and well-nourished. No distress.  HENT:  Head: Normocephalic and atraumatic.  Right Ear: External ear normal.  Left Ear: External ear normal.  Eyes: Conjunctivae are normal. No scleral icterus.  Neck: Normal range of motion. Neck supple. No thyromegaly present.  Cardiovascular: Normal rate, regular rhythm, normal heart sounds and intact distal pulses.  Pulmonary/Chest: Effort normal and breath sounds normal. No respiratory distress.  Musculoskeletal: She exhibits no edema.  Lymphadenopathy:    She has no cervical adenopathy.  Neurological: She is alert and oriented to person, place, and time.  Skin: Skin is warm and dry. She is not diaphoretic. No erythema.  Psychiatric: She has a normal mood and affect. Her behavior is normal.      Results for orders placed in visit on 04/24/13  POCT GLYCOSYLATED HEMOGLOBIN (HGB A1C)      Result Value Range   Hemoglobin A1C 5.3         Assessment & Plan:  Prediabetes - Plan: POCT glycosylated hemoglobin (Hb A1C) Pt reassured - she is doing fabulous as no longer has pre-dm.  No need for further monitoring other than annually or biannually.  She is  doing great with diet and exercise and plans to continue  Norberto Sorenson, MD MPH

## 2013-06-24 ENCOUNTER — Other Ambulatory Visit: Payer: Self-pay | Admitting: Family Medicine

## 2013-06-24 DIAGNOSIS — Z1231 Encounter for screening mammogram for malignant neoplasm of breast: Secondary | ICD-10-CM

## 2013-07-13 ENCOUNTER — Ambulatory Visit (HOSPITAL_COMMUNITY)
Admission: RE | Admit: 2013-07-13 | Discharge: 2013-07-13 | Disposition: A | Discharge status: Home / Self Care | Payer: Self-pay | Source: Ambulatory Visit | Attending: Family Medicine | Admitting: Family Medicine

## 2013-07-13 DIAGNOSIS — Z1231 Encounter for screening mammogram for malignant neoplasm of breast: Secondary | ICD-10-CM

## 2013-09-03 ENCOUNTER — Ambulatory Visit: Payer: No Typology Code available for payment source | Attending: Internal Medicine

## 2013-09-11 ENCOUNTER — Ambulatory Visit: Payer: Self-pay | Admitting: Family Medicine

## 2013-09-16 ENCOUNTER — Encounter (HOSPITAL_COMMUNITY): Payer: Self-pay | Admitting: Emergency Medicine

## 2013-09-16 ENCOUNTER — Emergency Department (INDEPENDENT_AMBULATORY_CARE_PROVIDER_SITE_OTHER)
Admission: EM | Admit: 2013-09-16 | Discharge: 2013-09-16 | Disposition: A | Discharge status: Home / Self Care | Payer: Self-pay | Source: Home / Self Care

## 2013-09-16 DIAGNOSIS — T148 Other injury of unspecified body region: Secondary | ICD-10-CM

## 2013-09-16 DIAGNOSIS — W57XXXA Bitten or stung by nonvenomous insect and other nonvenomous arthropods, initial encounter: Secondary | ICD-10-CM

## 2013-09-16 MED ORDER — PERMETHRIN 5 % EX CREA: TOPICAL_CREAM | CUTANEOUS | Status: DC

## 2013-09-16 NOTE — Discharge Instructions (Signed)
Bedbugs Bedbugs are tiny bugs that live in and around beds. During the day, they hide in mattresses and other places near beds. They come out at night and bite people lying in bed. They need blood to live and grow. Bedbugs can be found in beds anywhere. Usually, they are found in places where many people come and go (hotels, shelters, hospitals). It does not matter whether the place is dirty or clean. Getting bitten by bedbugs rarely causes a medical problem. The biggest problem can be getting rid of them. This often takes the work of a Financial risk analyst. CAUSES  Less use of pesticides. Bedbugs were common before the 1950s. Then, strong pesticides such as DDT nearly wiped them out. Today, these pesticides are not used because they harm the environment and can cause health problems.  More travel. Besides mattresses, bedbugs can also live in clothing and luggage. They can come along as people travel from place to place. Bedbugs are more common in certain parts of the world. When people travel to those areas, the bugs can come home with them.  Presence of birds and bats. Bedbugs often infest birds and bats. If you have these animals in or near your home, bedbugs may infest your house, too. SYMPTOMS It does not hurt to be bitten by a bedbug. You will probably not wake up when you are bitten. Bedbugs usually bite areas of the skin that are not covered. Symptoms may show when you wake up, or they may take a day or more to show up. Symptoms may include:  Small red bumps on the skin. These might be lined up in a row or clustered in a group.  A darker red dot in the middle of red bumps.  Blisters on the skin. There may be swelling and very bad itching. These may be signs of an allergic reaction. This does not happen often. DIAGNOSIS Bedbug bites might look and feel like other types of insect bites. The bugs do not stay on the body like ticks or lice. They bite, drop off, and crawl away to hide. Your  caregiver will probably:  Ask about your symptoms.  Ask about your recent activities and travel.  Check your skin for bedbug bites.  Ask you to check at home for signs of bedbugs. You should look for:  Spots or stains on the bed or nearby. This could be from bedbugs that were crushed or from their eggs or waste.  Bedbugs themselves. They are reddish-brown, oval, and flat. They do not fly. They are about the size of an apple seed.  Places to look for bedbugs include:  Beds. Check mattresses, headboards, box springs, and bed frames.  On drapes and curtains near the bed.  Under carpeting in the bedroom.  Behind electrical outlets.  Behind any wallpaper that is peeling.  Inside luggage. TREATMENT Most bedbug bites do not need treatment. They usually go away on their own in a few days. The bites are not dangerous. However, treatment may be needed if you have scratched so much that your skin has become infected. You may also need treatment if you are allergic to bedbug bites. Treatment options include:  A drug that stops swelling and itching (corticosteroid). Usually, a cream is rubbed on the skin. If you have a bad rash, you may be given a corticosteroid pill.  Oral antihistamines. These are pills to help control itching.  Antibiotic medicines. An antibiotic may be prescribed for infected skin. HOME CARE INSTRUCTIONS  Take any medicine prescribed by your caregiver for your bites. Follow the directions carefully.  Consider wearing pajamas with long sleeves and pant legs.  Your bedroom may need to be treated. A pest control expert should make sure the bedbugs are gone. You may need to throw away mattresses or luggage. Ask the pest control expert what you can do to keep the bedbugs from coming back. Common suggestions include:  Putting a plastic cover over your mattress.  Washing and drying your clothes and bedding in hot water and a hot dryer. The temperature should be hotter  than 120 F (48.9 C). Bedbugs are killed by high temperatures.  Vacuuming carefully all around your bed. Vacuum in all cracks and crevices where the bugs might hide. Do this often.  Carefully checking all used furniture, bedding, or clothes that you bring into your house.  Eliminating bird nests and bat roosts.  If you get bedbug bites when traveling, check all your possessions carefully before bringing them into your house. If you find any bugs on clothes or in your luggage, consider throwing those items away. SEEK MEDICAL CARE IF:  You have red bug bites that keep coming back.  You have red bug bites that itch badly.  You have bug bites that cause a skin rash.  You have scratch marks that are red and sore. SEEK IMMEDIATE MEDICAL CARE IF: You have a fever. Document Released: 06/02/2010 Document Revised: 07/23/2011 Document Reviewed: 06/02/2010 [facility] Patient Information 2014 [place], [place].  Insect Bite Mosquitoes, flies, fleas, bedbugs, and many other insects can bite. Insect bites are different from insect stings. A sting is when venom is injected into the skin. Some insect bites can transmit infectious diseases. SYMPTOMS  Insect bites usually turn red, swell, and itch for 2 to 4 days. They often go away on their own. TREATMENT  Your caregiver may prescribe antibiotic medicines if a bacterial infection develops in the bite. HOME CARE INSTRUCTIONS  Do not scratch the bite area.  Keep the bite area clean and dry. Wash the bite area thoroughly with soap and water.  Put ice or cool compresses on the bite area.  Put ice in a plastic bag.  Place a towel between your skin and the bag.  Leave the ice on for 20 minutes, 4 times a day for the first 2 to 3 days, or as directed.  You may apply a baking soda paste, cortisone cream, or calamine lotion to the bite area as directed by your caregiver. This can help reduce itching and swelling.  Only take over-the-counter or  prescription medicines as directed by your caregiver.  If you are given antibiotics, take them as directed. Finish them even if you start to feel better. You may need a tetanus shot if:  You cannot remember when you had your last tetanus shot.  You have never had a tetanus shot.  The injury broke your skin. If you get a tetanus shot, your arm may swell, get red, and feel warm to the touch. This is common and not a problem. If you need a tetanus shot and you choose not to have one, there is a rare chance of getting tetanus. Sickness from tetanus can be serious. SEEK IMMEDIATE MEDICAL CARE IF:   You have increased pain, redness, or swelling in the bite area.  You see a red line on the skin coming from the bite.  You have a fever.  You have joint pain.  You have a headache or neck  pain.  You have unusual weakness.  You have a rash.  You have chest pain or shortness of breath.  You have abdominal pain, nausea, or vomiting.  You feel unusually tired or sleepy. MAKE SURE YOU:   Understand these instructions.  Will watch your condition.  Will get help right away if you are not doing well or get worse. Document Released: 06/07/2004 Document Revised: 07/23/2011 Document Reviewed: 11/29/2010 [facility] Patient Information 2014 [place].

## 2013-09-16 NOTE — ED Notes (Signed)
C/o  Rash on abdomen since Monday.  Areas are red and raised.  Irritation.  No relief with otc creams.

## 2013-09-16 NOTE — ED Provider Notes (Signed)
CSN: [id number]     Arrival date & time 09/16/13  1156 History   First MD Initiated Contact with Patient 09/16/13 1257     Chief Complaint  Patient presents with  . Rash   (Consider location/radiation/quality/duration/timing/severity/associated sxs/prior Treatment) HPI Comments: 2 d ago noticed 3 red cutaneous "bumps" in a linear fashion to the L abdomen. Later one to the L anterior hip. She saw a black ant crawling on her and believed it to be the ant. No systemic sx's. St no itching but a little burning.    Past Medical History  Diagnosis Date  . Anxiety   . Asthma   . Cataract   . Anemia     ?  . Bronchitis   . History of chicken pox   . Glucose intolerance (pre-diabetes)   . Glaucoma   . Hemorrhoid   . Chronic kidney disease   . Mitral valve prolapse   . Pneumonia   . Tuberculosis     exposed as a young child   Past Surgical History  Procedure Laterality Date  . Fracture surgery     Family History  Problem Relation Age of Onset  . Diabetes Brother   . Hypertension Brother   . Alcohol abuse Brother   . Mental illness Brother     nervous breakdown  . Stroke Maternal Grandmother   . Depression Paternal Grandmother   . Arthritis Mother   . COPD Father 96    decsd due to lack of oxygen  . Hypertension Father   . Alcohol abuse Father   . Gout Father   . Alcohol abuse Brother   . Hypertension Brother    History  Substance Use Topics  . Smoking status: Never Smoker   . Smokeless tobacco: Never Used  . Alcohol Use: Yes   OB History   Grav Para Term Preterm Abortions TAB SAB Ect Mult Living                 Review of Systems  Constitutional: Negative.   Respiratory: Negative.   Skin:       As per HPI    Allergies  Review of patient's allergies indicates no known allergies.  Home Medications   Prior to Admission medications   Medication Sig Start Date End Date Taking? Authorizing Provider  IRON PO Take 1 tablet by mouth daily.   Yes Historical  Provider, MD  aspirin 81 MG tablet Take 81 mg by mouth daily.    Historical Provider, MD  calcium acetate (PHOSLO) 667 MG capsule Take 667 mg by mouth daily.    Historical Provider, MD  cholecalciferol (VITAMIN D) 1000 UNITS tablet Take 1,000 Units by mouth daily.    Historical Provider, MD  Cyanocobalamin (VITAMIN B-12 PO) Take by mouth 3 (three) times a week.    Historical Provider, MD  ibuprofen (ADVIL) 200 MG tablet Take 3 tablets (600 mg total) by mouth 3 (three) times daily. Take for 3 days. Take with meals 04/29/12   Shanker Kristeen Mans, MD  LORazepam (ATIVAN) 0.5 MG tablet Take 1 tablet (0.5 mg total) by mouth 2 (two) times daily as needed. For chest pain/tightness. 01/16/13   Shawnee Knapp, MD  MAGNESIUM PO Take 1 tablet by mouth daily.    Historical Provider, MD  Multiple Vitamins-Minerals (EYE VITAMINS) CAPS Take by mouth daily. For macular degenaration    Historical Provider, MD   BP 99/63  Pulse 72  Temp(Src) 98.3 F (36.8 C) (Oral)  Resp 12  Wt 124 lb (56.246 kg)  SpO2 100% Physical Exam  Nursing note and vitals reviewed. Constitutional: She is oriented to person, place, and time. She appears well-developed and well-nourished. No distress.  HENT:  Mouth/Throat: Oropharynx is clear and moist.  Pulmonary/Chest: Effort normal. No respiratory distress.  Neurological: She is alert and oriented to person, place, and time.  Skin: Skin is warm and dry.   3 annular cutaneous red lesions in a linear distribution to the L abd, one to the R anterior hip. Well marginated and consistent with bedbug bites.    ED Course  Procedures (including critical care time) Labs Review Labs Reviewed - No data to display  Imaging Review No results found.   MDM   1. Insect bite      May apply cortaid cream and Benadryl cream to the 4 bites. Also apply Elimite cream as directed Clean mattress and linens etc as directed, use RID spray. Read instructions    Janne Napoleon, NP 09/16/13 1316

## 2013-09-16 NOTE — ED Provider Notes (Signed)
Medical screening examination/treatment/procedure(s) were performed by non-physician practitioner and as supervising physician I was immediately available for consultation/collaboration.  Philipp Deputy, M.D.   Harden Mo, MD 09/16/13 [phone]

## 2013-09-25 ENCOUNTER — Ambulatory Visit: Payer: No Typology Code available for payment source | Attending: Internal Medicine | Admitting: Internal Medicine

## 2013-09-25 ENCOUNTER — Encounter: Payer: Self-pay | Admitting: Internal Medicine

## 2013-09-25 VITALS — BP 92/56 | HR 85 | Temp 97.6°F | Resp 16 | Wt 125.2 lb

## 2013-09-25 DIAGNOSIS — Z79899 Other long term (current) drug therapy: Secondary | ICD-10-CM | POA: Insufficient documentation

## 2013-09-25 DIAGNOSIS — R634 Abnormal weight loss: Secondary | ICD-10-CM | POA: Insufficient documentation

## 2013-09-25 DIAGNOSIS — D649 Anemia, unspecified: Secondary | ICD-10-CM | POA: Insufficient documentation

## 2013-09-25 DIAGNOSIS — F411 Generalized anxiety disorder: Secondary | ICD-10-CM | POA: Insufficient documentation

## 2013-09-25 DIAGNOSIS — I059 Rheumatic mitral valve disease, unspecified: Secondary | ICD-10-CM | POA: Insufficient documentation

## 2013-09-25 DIAGNOSIS — Z7982 Long term (current) use of aspirin: Secondary | ICD-10-CM | POA: Insufficient documentation

## 2013-09-25 DIAGNOSIS — J45909 Unspecified asthma, uncomplicated: Secondary | ICD-10-CM | POA: Insufficient documentation

## 2013-09-25 DIAGNOSIS — H409 Unspecified glaucoma: Secondary | ICD-10-CM | POA: Insufficient documentation

## 2013-09-25 DIAGNOSIS — Z0000 Encounter for general adult medical examination without abnormal findings: Secondary | ICD-10-CM

## 2013-09-25 DIAGNOSIS — H269 Unspecified cataract: Secondary | ICD-10-CM | POA: Insufficient documentation

## 2013-09-25 DIAGNOSIS — N189 Chronic kidney disease, unspecified: Secondary | ICD-10-CM | POA: Insufficient documentation

## 2013-09-25 LAB — POCT GLYCOSYLATED HEMOGLOBIN (HGB A1C): Hemoglobin A1C: 5.7

## 2013-09-25 LAB — GLUCOSE, POCT (MANUAL RESULT ENTRY): POC Glucose: 109 mg/dl — AB (ref 70–99)

## 2013-09-25 NOTE — Progress Notes (Signed)
Patient is here to establish care. States she is concerned about last breast imaging States she has lost 20 lbs since 2012 which is concerning to her.

## 2013-09-26 NOTE — Progress Notes (Signed)
Patient ID: Emily [name], female   DOB: Nov 04, 1950, 63 y.o.   MRN: [id number]   [id number]:[id number]  [id number]  DOB - [birth year]  CC:  Chief Complaint  Patient presents with  . Establish Care       HPI: Emily [name] is a 63 y.o. female here today to establish medical care. Patient reports that had a mammography 2 months prior and has been concerned with the results since then.  She reports that she has been lossing a substantial amount of weight over the past two years without intention.  She is unable to give me a number of pounds. Review of charts over past 1.5 years only shows 3 pound weight difference. Patient reports that she has been to a number of doctors and has not been happy with the care she has received.    No Known Allergies Past Medical History  Diagnosis Date  . Anxiety   . Asthma   . Cataract   . Anemia     ?  . Bronchitis   . History of chicken pox   . Glucose intolerance (pre-diabetes)   . Glaucoma   . Hemorrhoid   . Chronic kidney disease   . Mitral valve prolapse   . Pneumonia   . Tuberculosis     exposed as a young child   Current Outpatient Prescriptions on File Prior to Visit  Medication Sig Dispense Refill  . cholecalciferol (VITAMIN D) 1000 UNITS tablet Take 1,000 Units by mouth daily.      Marland Kitchen ibuprofen (ADVIL) 200 MG tablet Take 3 tablets (600 mg total) by mouth 3 (three) times daily. Take for 3 days. Take with meals  30 tablet  0  . IRON PO Take 1 tablet by mouth daily.      Marland Kitchen aspirin 81 MG tablet Take 81 mg by mouth daily.      . calcium acetate (PHOSLO) 667 MG capsule Take 667 mg by mouth daily.      . Cyanocobalamin (VITAMIN B-12 PO) Take by mouth 3 (three) times a week.      Marland Kitchen LORazepam (ATIVAN) 0.5 MG tablet Take 1 tablet (0.5 mg total) by mouth 2 (two) times daily as needed. For chest pain/tightness.  30 tablet  0  . MAGNESIUM PO Take 1 tablet by mouth daily.      . Multiple Vitamins-Minerals (EYE VITAMINS) CAPS Take by mouth daily. For  macular degenaration      . permethrin (ELIMITE) 5 % cream Apply from neck to feet, rinse in 8hrs. May reapply in 1 week prn  60 g  1   No current facility-administered medications on file prior to visit.   Family History  Problem Relation Age of Onset  . Diabetes Brother   . Hypertension Brother   . Alcohol abuse Brother   . Mental illness Brother     nervous breakdown  . Stroke Maternal Grandmother   . Depression Paternal Grandmother   . Arthritis Mother   . COPD Father 69    decsd due to lack of oxygen  . Hypertension Father   . Alcohol abuse Father   . Gout Father   . Alcohol abuse Brother   . Hypertension Brother    History   Social History  . Marital Status: Divorced    Spouse Name: N/A    Number of Children: N/A  . Years of Education: N/A   Occupational History  . Not on file.   Social History Main Topics  . Smoking status:  Never Smoker   . Smokeless tobacco: Never Used  . Alcohol Use: Yes  . Drug Use: No  . Sexual Activity: No   Other Topics Concern  . Not on file   Social History Narrative  . No narrative on file   Review of Systems  Constitutional: Positive for weight loss. Negative for fever and chills.       Reports she has noticed a fat pad under her left breast  HENT: Negative for tinnitus.        Reports thinning hair and hair loss   Eyes: Positive for blurred vision. Negative for photophobia.  Respiratory: Negative.   Cardiovascular: Negative.   Gastrointestinal: Negative.   Genitourinary: Negative.   Musculoskeletal: Negative.   Skin: Negative.   Neurological: Negative.  Negative for headaches.  Endo/Heme/Allergies: Negative.   Psychiatric/Behavioral: Negative.       Objective:   Filed Vitals:   09/25/13 1605  BP: 92/56  Pulse: 85  Temp: 97.6 F (36.4 C)  Resp: 16   Physical Exam  Constitutional: She is oriented to person, place, and time and well-developed, well-nourished, and in no distress.  HENT:  Left Ear: External  ear normal.  Mouth/Throat: Oropharynx is clear and moist.  Eyes: Conjunctivae and EOM are normal. Pupils are equal, round, and reactive to light.  Neck: Normal range of motion. Neck supple. No JVD present. No thyromegaly present.  Cardiovascular: Normal rate, regular rhythm, normal heart sounds and intact distal pulses.   Pulmonary/Chest: Effort normal and breath sounds normal. Right breast exhibits no inverted nipple, no mass, no nipple discharge, no skin change and no tenderness. Left breast exhibits no inverted nipple, no mass (dense breast tissue, advise pt to cutt back on caffiene intake), no nipple discharge, no skin change and no tenderness.  Abdominal: Soft. Bowel sounds are normal.  Musculoskeletal: Normal range of motion.  Lymphadenopathy:    She has no cervical adenopathy.  Neurological: She is alert and oriented to person, place, and time. She has normal reflexes.  Skin: Skin is warm and dry.  Psychiatric:  Anxious      Lab Results  Component Value Date   WBC 4.0 08/20/2012   HGB 12.7 08/20/2012   HCT 37.2 08/20/2012   MCV 79.5 08/20/2012   PLT 257 08/20/2012   Lab Results  Component Value Date   CREATININE 0.67 01/23/2013   BUN 20 01/23/2013   NA 138 08/20/2012   K 3.7 08/20/2012   CL 100 08/20/2012   CO2 30 08/20/2012    Lab Results  Component Value Date   HGBA1C 5.7 09/25/2013   Lipid Panel     Component Value Date/Time   CHOL 209* 08/20/2012 1109   TRIG 36 08/20/2012 1109   HDL 104 08/20/2012 1109   CHOLHDL 2.0 08/20/2012 1109   VLDL 7 08/20/2012 1109   LDLCALC 98 08/20/2012 1109       Assessment and plan:   Emily [name] was seen today for establish care.  Diagnoses and associated orders for this visit:  Preventative health care - Glucose (CBG) - HgB A1c - Ambulatory referral to Gastroenterology - CBC with Differential; Future - COMPLETE METABOLIC PANEL WITH GFR; Future - TSH; Future - Vitamin D, 25-hydroxy; Future - Lipid panel; Future Explained to patient that per  guidelines a pap is not needed after age 75. Explained that if she has had normal pap's in the past then she will not need this last pap.  Patient want to have one last pap completed  before age 18.  Explained that based off mammography results and breast exam today that she will not need another mammography for 1 year. Explained to patient that a decrease in caffeine will help and what to look for on breast exam  Return in about 1 week (around 10/02/2013) for Lab Visit----she may schedule pap whenever.   Chari Manning, NP-C [facility] [phone] 09/26/2013, 3:46 PM

## 2013-10-02 ENCOUNTER — Ambulatory Visit: Payer: No Typology Code available for payment source | Attending: Internal Medicine

## 2013-10-02 DIAGNOSIS — Z0000 Encounter for general adult medical examination without abnormal findings: Secondary | ICD-10-CM

## 2013-10-02 LAB — CBC WITH DIFFERENTIAL/PLATELET
BASOS ABS: 0 10*3/uL (ref 0.0–0.1)
BASOS PCT: 1 % (ref 0–1)
Eosinophils Absolute: 0.1 10*3/uL (ref 0.0–0.7)
Eosinophils Relative: 3 % (ref 0–5)
HCT: 36.2 % (ref 36.0–46.0)
HEMOGLOBIN: 11.8 g/dL — AB (ref 12.0–15.0)
Lymphocytes Relative: 43 % (ref 12–46)
Lymphs Abs: 1.6 10*3/uL (ref 0.7–4.0)
MCH: 26.8 pg (ref 26.0–34.0)
MCHC: 32.6 g/dL (ref 30.0–36.0)
MCV: 82.3 fL (ref 78.0–100.0)
MONO ABS: 0.6 10*3/uL (ref 0.1–1.0)
Monocytes Relative: 16 % — ABNORMAL HIGH (ref 3–12)
NEUTROS ABS: 1.4 10*3/uL — AB (ref 1.7–7.7)
NEUTROS PCT: 37 % — AB (ref 43–77)
Platelets: 239 10*3/uL (ref 150–400)
RBC: 4.4 MIL/uL (ref 3.87–5.11)
RDW: 14.7 % (ref 11.5–15.5)
WBC: 3.7 10*3/uL — ABNORMAL LOW (ref 4.0–10.5)

## 2013-10-03 LAB — COMPLETE METABOLIC PANEL WITH GFR
ALK PHOS: 45 U/L (ref 39–117)
ALT: 12 U/L (ref 0–35)
AST: 14 U/L (ref 0–37)
Albumin: 4 g/dL (ref 3.5–5.2)
BUN: 18 mg/dL (ref 6–23)
CO2: 30 mEq/L (ref 19–32)
Calcium: 8.9 mg/dL (ref 8.4–10.5)
Chloride: 102 mEq/L (ref 96–112)
Creat: 0.64 mg/dL (ref 0.50–1.10)
GFR, Est African American: >89 mL/min
GLUCOSE: 89 mg/dL (ref 70–99)
Potassium: 4 mEq/L (ref 3.5–5.3)
SODIUM: 139 meq/L (ref 135–145)
Total Bilirubin: 0.4 mg/dL (ref 0.2–1.2)
Total Protein: 6.6 g/dL (ref 6.0–8.3)

## 2013-10-03 LAB — VITAMIN D 25 HYDROXY (VIT D DEFICIENCY, FRACTURES): Vit D, 25-Hydroxy: 51 ng/mL (ref 30–89)

## 2013-10-03 LAB — LIPID PANEL
CHOLESTEROL: 214 mg/dL — AB (ref 0–200)
HDL: 82 mg/dL (ref >39)
LDL Cholesterol: 124 mg/dL — ABNORMAL HIGH (ref 0–99)
Total CHOL/HDL Ratio: 2.6 Ratio
Triglycerides: 40 mg/dL (ref <150)
VLDL: 8 mg/dL (ref 0–40)

## 2013-10-03 LAB — TSH: TSH: 1.352 u[IU]/mL (ref 0.350–4.500)

## 2013-10-09 ENCOUNTER — Encounter: Payer: Self-pay | Admitting: Internal Medicine

## 2013-10-09 ENCOUNTER — Ambulatory Visit: Payer: No Typology Code available for payment source | Attending: Internal Medicine | Admitting: Internal Medicine

## 2013-10-09 ENCOUNTER — Other Ambulatory Visit (HOSPITAL_COMMUNITY)
Admission: RE | Admit: 2013-10-09 | Discharge: 2013-10-09 | Disposition: A | Discharge status: Home / Self Care | Payer: No Typology Code available for payment source | Source: Ambulatory Visit | Attending: Internal Medicine | Admitting: Internal Medicine

## 2013-10-09 VITALS — BP 104/69 | HR 72 | Temp 98.2°F | Resp 16 | Ht 63.0 in | Wt 127.0 lb

## 2013-10-09 DIAGNOSIS — Z124 Encounter for screening for malignant neoplasm of cervix: Secondary | ICD-10-CM

## 2013-10-09 DIAGNOSIS — Z1272 Encounter for screening for malignant neoplasm of vagina: Secondary | ICD-10-CM | POA: Insufficient documentation

## 2013-10-09 DIAGNOSIS — Z01419 Encounter for gynecological examination (general) (routine) without abnormal findings: Secondary | ICD-10-CM | POA: Insufficient documentation

## 2013-10-09 DIAGNOSIS — Z7982 Long term (current) use of aspirin: Secondary | ICD-10-CM | POA: Insufficient documentation

## 2013-10-09 DIAGNOSIS — Z1151 Encounter for screening for human papillomavirus (HPV): Secondary | ICD-10-CM | POA: Insufficient documentation

## 2013-10-09 NOTE — Progress Notes (Signed)
Pt is here for a physical and a pap smear. Pt requesting to review her last lab results.

## 2013-10-09 NOTE — Progress Notes (Signed)
Patient ID: Emily [name], female   DOB: Sep 23, 1950, 63 y.o.   MRN: [id number]  CC: pap smear  HPI: Patient presents to clinic today for a pap smear. Patient reports that she has not had a pap smear in a three years. She states that her last pap smear was normal.  She denies all complaints today.   No Known Allergies Past Medical History  Diagnosis Date  . Anxiety   . Asthma   . Cataract   . Anemia     ?  . Bronchitis   . History of chicken pox   . Glucose intolerance (pre-diabetes)   . Glaucoma   . Hemorrhoid   . Chronic kidney disease   . Mitral valve prolapse   . Pneumonia   . Tuberculosis     exposed as a young child   Current Outpatient Prescriptions on File Prior to Visit  Medication Sig Dispense Refill  . aspirin 81 MG tablet Take 81 mg by mouth daily.      . calcium acetate (PHOSLO) 667 MG capsule Take 667 mg by mouth daily.      . cholecalciferol (VITAMIN D) 1000 UNITS tablet Take 1,000 Units by mouth daily.      . Cyanocobalamin (VITAMIN B-12 PO) Take by mouth 3 (three) times a week.      Marland Kitchen ibuprofen (ADVIL) 200 MG tablet Take 3 tablets (600 mg total) by mouth 3 (three) times daily. Take for 3 days. Take with meals  30 tablet  0  . IRON PO Take 1 tablet by mouth daily.      Marland Kitchen LORazepam (ATIVAN) 0.5 MG tablet Take 1 tablet (0.5 mg total) by mouth 2 (two) times daily as needed. For chest pain/tightness.  30 tablet  0  . MAGNESIUM PO Take 1 tablet by mouth daily.      . Multiple Vitamins-Minerals (EYE VITAMINS) CAPS Take by mouth daily. For macular degenaration      . permethrin (ELIMITE) 5 % cream Apply from neck to feet, rinse in 8hrs. May reapply in 1 week prn  60 g  1   No current facility-administered medications on file prior to visit.   Family History  Problem Relation Age of Onset  . Diabetes Brother   . Hypertension Brother   . Alcohol abuse Brother   . Mental illness Brother     nervous breakdown  . Stroke Maternal Grandmother   . Depression Paternal  Grandmother   . Arthritis Mother   . COPD Father 34    decsd due to lack of oxygen  . Hypertension Father   . Alcohol abuse Father   . Gout Father   . Alcohol abuse Brother   . Hypertension Brother    History   Social History  . Marital Status: Divorced    Spouse Name: N/A    Number of Children: N/A  . Years of Education: N/A   Occupational History  . Not on file.   Social History Main Topics  . Smoking status: Never Smoker   . Smokeless tobacco: Never Used  . Alcohol Use: Yes  . Drug Use: No  . Sexual Activity: No   Other Topics Concern  . Not on file   Social History Narrative  . No narrative on file   Review of Systems  Respiratory: Negative.   Cardiovascular: Negative.   Gastrointestinal: Negative.   Genitourinary: Negative.       Objective:   Filed Vitals:   10/09/13 1002  BP: 104/69  Pulse: 72  Temp: 98.2 F (36.8 C)  Resp: 16   Physical Exam  Constitutional: She is oriented to person, place, and time.  Cardiovascular: Normal rate, regular rhythm and normal heart sounds.   Pulmonary/Chest: Effort normal and breath sounds normal.  Abdominal: Soft. Bowel sounds are normal.  Genitourinary: Vagina normal and uterus normal. Cervix exhibits no motion tenderness, no discharge and no friability. Right adnexum displays no mass and no tenderness. Left adnexum displays no mass and no tenderness.  Neurological: She is alert and oriented to person, place, and time.  Skin: Skin is warm and dry.  Psychiatric: She has a normal mood and affect.     Lab Results  Component Value Date   WBC 3.7* 10/02/2013   HGB 11.8* 10/02/2013   HCT 36.2 10/02/2013   MCV 82.3 10/02/2013   PLT 239 10/02/2013   Lab Results  Component Value Date   CREATININE 0.64 10/02/2013   BUN 18 10/02/2013   NA 139 10/02/2013   K 4.0 10/02/2013   CL 102 10/02/2013   CO2 30 10/02/2013    Lab Results  Component Value Date   HGBA1C 5.7 09/25/2013   Lipid Panel     Component Value  Date/Time   CHOL 214* 10/02/2013 0903   TRIG 40 10/02/2013 0903   HDL 82 10/02/2013 0903   CHOLHDL 2.6 10/02/2013 0903   VLDL 8 10/02/2013 0903   LDLCALC 124* 10/02/2013 0903       Assessment and plan:   Emily [name] was seen today for follow-up.  Diagnoses and associated orders for this visit:  Papanicolaou smear - Cytology - PAP Explained to patient that this will be her last pap smear per guidelines.  Will call patient with results when available.  Discussed previous lab results with patient. Will make lifestyle changes and repeat lipid panel in 6 months. Return in about 6 months (around 04/11/2014) for Lab Visit-repeat lipid panel.   Emily [name], [facility] [phone] 10/09/2013, 11:27 AM

## 2013-10-14 ENCOUNTER — Telehealth: Payer: Self-pay | Admitting: Emergency Medicine

## 2013-10-14 NOTE — Telephone Encounter (Signed)
Message copied by Ricci Barker on Wed Oct 14, 2013  4:32 PM ------      Message from: Chari Manning A      Created: Tue Oct 13, 2013  6:09 PM       Let patient know her pap was normal and she does not need anymore paps. Thanks ------

## 2013-10-14 NOTE — Telephone Encounter (Signed)
Left message for pt to return call when received

## 2013-10-16 ENCOUNTER — Telehealth: Payer: Self-pay | Admitting: Emergency Medicine

## 2013-10-16 ENCOUNTER — Encounter: Payer: Self-pay | Admitting: *Deleted

## 2013-10-16 ENCOUNTER — Other Ambulatory Visit: Payer: No Typology Code available for payment source

## 2013-10-16 DIAGNOSIS — Z113 Encounter for screening for infections with a predominantly sexual mode of transmission: Secondary | ICD-10-CM

## 2013-10-16 NOTE — Telephone Encounter (Signed)
Returned pt phone call to give pap smear results. Pt informed pap smear normal;she will not need anymore. Pt states she is coming in today for same day visit due to leg pain and requesting HIV test as well. States she told provider she wanted one.

## 2013-10-16 NOTE — Progress Notes (Unsigned)
Patient walked into [facility] today requesting an HIV test and c/o leg pain.  Spoke with Chari Manning NP who gave a verbal to order the test.

## 2013-10-17 LAB — HIV ANTIBODY (ROUTINE TESTING W REFLEX): HIV 1&2 Ab, 4th Generation: NONREACTIVE

## 2013-10-20 ENCOUNTER — Encounter: Payer: Self-pay | Admitting: *Deleted

## 2013-10-20 ENCOUNTER — Telehealth: Payer: Self-pay | Admitting: *Deleted

## 2013-10-20 NOTE — Telephone Encounter (Signed)
Message copied by Velora Heckler on Tue Oct 20, 2013 12:02 PM ------      Message from: Chari Manning A      Created: Mon Oct 05, 2013  1:01 PM       Let patient know her labs are normal except for cholesterol is high. Let her know the dietary changes she needs to make and exercise. Let her know she can make lifestyle changes first before starting medication. We will recheck lipid panel in 6 months and go from there. Thanks. ------

## 2013-10-20 NOTE — Telephone Encounter (Signed)
Patient walked in requesting a copy of lab results.  Results of HIV, Pap and blood work drawn on 10/02/13 given to patient.  Patient was also given a copy of the DASH diet as well as instructions on exercise and lifestyle changes.

## 2013-10-20 NOTE — Telephone Encounter (Signed)
Message copied by Velora Heckler on Tue Oct 20, 2013 12:07 PM ------      Message from: Lance Bosch      Created: Sun Oct 18, 2013  6:42 PM       Let patient know she is negative for HIV. Thanks ------

## 2013-10-20 NOTE — Progress Notes (Unsigned)
Patient ID: Emily [name], female   DOB: [birth year], 63 y.o.   MRN: [id number]  Patient walked in requesting a copy of lab results. Results of HIV, Pap and blood work drawn on 10/02/13 given to patient. Patient was also given a copy of the DASH diet as well as instructions on exercise and lifestyle changes.

## 2013-10-20 NOTE — Patient Instructions (Signed)
DASH Diet  The DASH diet stands for "Dietary Approaches to Stop Hypertension." It is a healthy eating plan that has been shown to reduce high blood pressure (hypertension) in as little as 14 days, while also possibly providing other significant health benefits. These other health benefits include reducing the risk of breast cancer after menopause and reducing the risk of type 2 diabetes, heart disease, colon cancer, and stroke. Health benefits also include weight loss and slowing kidney failure in patients with chronic kidney disease.   DIET GUIDELINES  · Limit salt (sodium). Your diet should contain less than 1500 mg of sodium daily.  · Limit refined or processed carbohydrates. Your diet should include mostly whole grains. Desserts and added sugars should be used sparingly.  · Include small amounts of heart-healthy fats. These types of fats include nuts, oils, and tub margarine. Limit saturated and trans fats. These fats have been shown to be harmful in the body.  CHOOSING FOODS   The following food groups are based on a 2000 calorie diet. See your Registered Dietitian for individual calorie needs.  Grains and Grain Products (6 to 8 servings daily)  · Eat More Often: Whole-wheat bread, brown rice, whole-grain or wheat pasta, quinoa, popcorn without added fat or salt (air popped).  · Eat Less Often: White bread, white pasta, white rice, cornbread.  Vegetables (4 to 5 servings daily)  · Eat More Often: Fresh, frozen, and canned vegetables. Vegetables may be raw, steamed, roasted, or grilled with a minimal amount of fat.  · Eat Less Often/Avoid: Creamed or fried vegetables. Vegetables in a cheese sauce.  Fruit (4 to 5 servings daily)  · Eat More Often: All fresh, canned (in natural juice), or frozen fruits. Dried fruits without added sugar. One hundred percent fruit juice (½ cup [237 mL] daily).  · Eat Less Often: Dried fruits with added sugar. Canned fruit in light or heavy syrup.  Lean Meats, Fish, and Poultry (2  servings or less daily. One serving is 3 to 4 oz [85-114 g]).  · Eat More Often: Ninety percent or leaner ground beef, tenderloin, sirloin. Round cuts of beef, chicken breast, turkey breast. All fish. Grill, bake, or broil your meat. Nothing should be fried.  · Eat Less Often/Avoid: Fatty cuts of meat, turkey, or chicken leg, thigh, or wing. Fried cuts of meat or fish.  Dairy (2 to 3 servings)  · Eat More Often: Low-fat or fat-free milk, low-fat plain or light yogurt, reduced-fat or part-skim cheese.  · Eat Less Often/Avoid: Milk (whole, 2%). Whole milk yogurt. Full-fat cheeses.  Nuts, Seeds, and Legumes (4 to 5 servings per week)  · Eat More Often: All without added salt.  · Eat Less Often/Avoid: Salted nuts and seeds, canned beans with added salt.  Fats and Sweets (limited)  · Eat More Often: Vegetable oils, tub margarines without trans fats, sugar-free gelatin. Mayonnaise and salad dressings.  · Eat Less Often/Avoid: Coconut oils, palm oils, butter, stick margarine, cream, half and half, cookies, candy, pie.  FOR MORE INFORMATION  The Dash Diet Eating Plan: www.dashdiet.org  Document Released: 04/19/2011 Document Revised: 07/23/2011 Document Reviewed: 04/19/2011  ExitCare® Patient Information ©2014 [employer].

## 2013-11-04 ENCOUNTER — Encounter: Payer: Self-pay | Admitting: Internal Medicine

## 2013-12-16 ENCOUNTER — Ambulatory Visit: Payer: No Typology Code available for payment source | Admitting: Internal Medicine

## 2013-12-17 ENCOUNTER — Telehealth: Payer: Self-pay

## 2013-12-17 NOTE — Telephone Encounter (Signed)
Pt of Dr. Brigitte Pulse has a few questions about sickle cell, would like to have a nurse call her back for someinformation

## 2013-12-17 NOTE — Telephone Encounter (Signed)
Lm for rtn call 

## 2013-12-18 NOTE — Telephone Encounter (Signed)
Lm for rtn call 

## 2013-12-19 NOTE — Telephone Encounter (Signed)
Disregard message. Pt's questions have already been answered.

## 2014-01-06 ENCOUNTER — Encounter: Payer: Self-pay | Admitting: Internal Medicine

## 2014-01-06 ENCOUNTER — Ambulatory Visit: Payer: No Typology Code available for payment source | Attending: Internal Medicine | Admitting: Internal Medicine

## 2014-01-06 VITALS — BP 99/64 | HR 73 | Temp 98.0°F | Resp 18 | Ht 62.0 in | Wt 126.0 lb

## 2014-01-06 DIAGNOSIS — F411 Generalized anxiety disorder: Secondary | ICD-10-CM | POA: Insufficient documentation

## 2014-01-06 DIAGNOSIS — M5380 Other specified dorsopathies, site unspecified: Secondary | ICD-10-CM | POA: Insufficient documentation

## 2014-01-06 DIAGNOSIS — H409 Unspecified glaucoma: Secondary | ICD-10-CM | POA: Insufficient documentation

## 2014-01-06 DIAGNOSIS — M62838 Other muscle spasm: Secondary | ICD-10-CM | POA: Insufficient documentation

## 2014-01-06 DIAGNOSIS — J45909 Unspecified asthma, uncomplicated: Secondary | ICD-10-CM | POA: Insufficient documentation

## 2014-01-06 DIAGNOSIS — J40 Bronchitis, not specified as acute or chronic: Secondary | ICD-10-CM | POA: Insufficient documentation

## 2014-01-06 DIAGNOSIS — M25559 Pain in unspecified hip: Secondary | ICD-10-CM | POA: Insufficient documentation

## 2014-01-06 DIAGNOSIS — Z7982 Long term (current) use of aspirin: Secondary | ICD-10-CM | POA: Insufficient documentation

## 2014-01-06 DIAGNOSIS — E739 Lactose intolerance, unspecified: Secondary | ICD-10-CM | POA: Insufficient documentation

## 2014-01-06 DIAGNOSIS — M25552 Pain in left hip: Secondary | ICD-10-CM

## 2014-01-06 DIAGNOSIS — D649 Anemia, unspecified: Secondary | ICD-10-CM | POA: Insufficient documentation

## 2014-01-06 DIAGNOSIS — I059 Rheumatic mitral valve disease, unspecified: Secondary | ICD-10-CM | POA: Insufficient documentation

## 2014-01-06 DIAGNOSIS — N189 Chronic kidney disease, unspecified: Secondary | ICD-10-CM | POA: Insufficient documentation

## 2014-01-06 DIAGNOSIS — Z201 Contact with and (suspected) exposure to tuberculosis: Secondary | ICD-10-CM | POA: Insufficient documentation

## 2014-01-06 NOTE — Progress Notes (Signed)
Stated has pain on lt hip, hip spasm when standing  Comes on without worning

## 2014-01-06 NOTE — Patient Instructions (Signed)
Hip Pain Your hip is the joint between your upper legs and your lower pelvis. The bones, cartilage, tendons, and muscles of your hip joint perform a lot of work each day supporting your body weight and allowing you to move around. Hip pain can range from a minor ache to severe pain in one or both of your hips. Pain may be felt on the inside of the hip joint near the groin, or the outside near the buttocks and upper thigh. You may have swelling or stiffness as well.  HOME CARE INSTRUCTIONS   Take medicines only as directed by your health care provider.  Apply ice to the injured area:  Put ice in a plastic bag.  Place a towel between your skin and the bag.  Leave the ice on for 15-20 minutes at a time, 3-4 times a day.  Keep your leg raised (elevated) when possible to lessen swelling.  Avoid activities that cause pain.  Follow specific exercises as directed by your health care provider.  Sleep with a pillow between your legs on your most comfortable side.  Record how often you have hip pain, the location of the pain, and what it feels like. SEEK MEDICAL CARE IF:   You are unable to put weight on your leg.  Your hip is red or swollen or very tender to touch.  Your pain or swelling continues or worsens after 1 week.  You have increasing difficulty walking.  You have a fever. SEEK IMMEDIATE MEDICAL CARE IF:   You have fallen.  You have a sudden increase in pain and swelling in your hip. MAKE SURE YOU:   Understand these instructions.  Will watch your condition.  Will get help right away if you are not doing well or get worse. Document Released: 10/18/2009 Document Revised: 09/14/2013 Document Reviewed: 12/25/2012 ExitCare Patient Information 2015 [employer]. This information is not intended to replace advice given to you by your health care provider. Make sure you discuss any questions you have with your health care provider.  

## 2014-01-06 NOTE — Progress Notes (Signed)
Patient ID: Emily [name], female   DOB: Jul 16, 1950, 63 y.o.   MRN: [id number]  CC: hip pain  HPI:  Patient presents to clinic today for c/o of left hip pain.  She reports that she has been having soreness.  Had injections in 2011 and had soreness since this event.  She reports that she often gets spasms in her hip that prevent her from mobilizing.  She notes that she has been unable to work due to hip pain.  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 Has been having back spasms as well.  She has tried aleve and heat.    No Known Allergies Past Medical History  Diagnosis Date  . Anxiety   . Asthma   . Cataract   . Anemia     ?  . Bronchitis   . History of chicken pox   . Glucose intolerance (pre-diabetes)   . Glaucoma   . Hemorrhoid   . Chronic kidney disease   . Mitral valve prolapse   . Pneumonia   . Tuberculosis     exposed as a young child   Current Outpatient Prescriptions on File Prior to Visit  Medication Sig Dispense Refill  . aspirin 81 MG tablet Take 81 mg by mouth daily.      . calcium acetate (PHOSLO) 667 MG capsule Take 667 mg by mouth daily.      . cholecalciferol (VITAMIN D) 1000 UNITS tablet Take 1,000 Units by mouth daily.      . Cyanocobalamin (VITAMIN B-12 PO) Take by mouth 3 (three) times a week.      Marland Kitchen ibuprofen (ADVIL) 200 MG tablet Take 3 tablets (600 mg total) by mouth 3 (three) times daily. Take for 3 days. Take with meals  30 tablet  0  . IRON PO Take 1 tablet by mouth daily.      Marland Kitchen LORazepam (ATIVAN) 0.5 MG tablet Take 1 tablet (0.5 mg total) by mouth 2 (two) times daily as needed. For chest pain/tightness.  30 tablet  0  . MAGNESIUM PO Take 1 tablet by mouth daily.      . Multiple Vitamins-Minerals (EYE VITAMINS) CAPS Take by mouth daily. For macular degenaration      . permethrin (ELIMITE) 5 % cream Apply from neck to feet, rinse in 8hrs. May reapply in 1 week prn  60 g  1   No current facility-administered  medications on file prior to visit.   Family History  Problem Relation Age of Onset  . Diabetes Brother   . Hypertension Brother   . Alcohol abuse Brother   . Mental illness Brother     nervous breakdown  . Stroke Maternal Grandmother   . Depression Paternal Grandmother   . Arthritis Mother   . COPD Father 76    decsd due to lack of oxygen  . Hypertension Father   . Alcohol abuse Father   . Gout Father   . Alcohol abuse Brother   . Hypertension Brother    History   Social History  . Marital Status: Divorced    Spouse Name: N/A    Number of Children: N/A  . Years of Education: N/A   Occupational History  . Not on file.   Social History Main Topics  . Smoking status: Never Smoker   . Smokeless tobacco: Never Used  . Alcohol Use: Yes  . Drug Use: No  . Sexual Activity: No   Other  Topics Concern  . Not on file   Social History Narrative  . No narrative on file    Review of Systems: See HPI.    Objective:   Filed Vitals:   01/06/14 1050  BP: 99/64  Pulse: 73  Temp: 98 F (36.7 C)  Resp: 18    Physical Exam: Constitutional: Patient appears well-developed and well-nourished. No distress. CVS: RRR, S1/S2 +, no murmurs, no gallops, no carotid bruit.  Pulmonary: Effort and breath sounds normal, no stridor, rhonchi, wheezes, rales.  Abdominal: Soft. BS +,  no distension, tenderness, rebound or guarding.  Musculoskeletal: Normal range of motion of bilateral hip joints. No edema and no tenderness.  Neuro: Alert. Normal reflexes, muscle tone coordination.  Skin: Skin is warm and dry. No rash noted. Not diaphoretic. No erythema. No pallor. Psychiatric: Normal mood and affect. Behavior, judgment, thought content normal.  Lab Results  Component Value Date   WBC 3.7* 10/02/2013   HGB 11.8* 10/02/2013   HCT 36.2 10/02/2013   MCV 82.3 10/02/2013   PLT 239 10/02/2013   Lab Results  Component Value Date   CREATININE 0.64 10/02/2013   BUN 18 10/02/2013   NA 139  10/02/2013   K 4.0 10/02/2013   CL 102 10/02/2013   CO2 30 10/02/2013    Lab Results  Component Value Date   HGBA1C 5.7 09/25/2013   Lipid Panel     Component Value Date/Time   CHOL 214* 10/02/2013 0903   TRIG 40 10/02/2013 0903   HDL 82 10/02/2013 0903   CHOLHDL 2.6 10/02/2013 0903   VLDL 8 10/02/2013 0903   LDLCALC 124* 10/02/2013 0903       Assessment and plan:   Emily [name] was seen today for hip pain.  Diagnoses and associated orders for this visit:  Left hip pain May continue to use Aleve and heat.  Do not see need to xray at moment. Pain is improved today. Will give note for work.   Return if symptoms worsen or fail to improve.      Chari Manning, [facility] [phone] 01/06/2014, 11:06 AM

## 2014-03-30 ENCOUNTER — Telehealth: Payer: Self-pay

## 2014-03-30 NOTE — Telephone Encounter (Signed)
Pt of Dr Brigitte Pulse would like to find out if she needs to add plan D to her medicare, She would like to know if Dr. Brigitte Pulse has any advise for her. Her deadline is Dec 7th. Please advise pt

## 2014-03-30 NOTE — Telephone Encounter (Signed)
Patient returned call. Please call back and advise.

## 2014-03-30 NOTE — Telephone Encounter (Signed)
Pt advised.

## 2014-03-30 NOTE — Telephone Encounter (Signed)
LM for rtn call. Medicare plan D is for prescription medications- since pt is on medications that are prescribed to her she should have some type of medication coverage.

## 2014-04-05 DIAGNOSIS — H25013 Cortical age-related cataract, bilateral: Secondary | ICD-10-CM | POA: Diagnosis not present

## 2014-04-05 DIAGNOSIS — H3531 Nonexudative age-related macular degeneration: Secondary | ICD-10-CM | POA: Diagnosis not present

## 2014-04-05 DIAGNOSIS — H2513 Age-related nuclear cataract, bilateral: Secondary | ICD-10-CM | POA: Diagnosis not present

## 2014-04-05 DIAGNOSIS — E119 Type 2 diabetes mellitus without complications: Secondary | ICD-10-CM | POA: Diagnosis not present

## 2014-04-10 LAB — HM COLONOSCOPY

## 2014-04-12 ENCOUNTER — Other Ambulatory Visit: Payer: Self-pay

## 2014-04-15 ENCOUNTER — Telehealth: Payer: Self-pay

## 2014-04-15 DIAGNOSIS — E1110 Type 2 diabetes mellitus with ketoacidosis without coma: Secondary | ICD-10-CM

## 2014-04-15 NOTE — Telephone Encounter (Signed)
Pt called in and states she needs an A1C test done before her cataract surgery on Dec 12th. She can come in for an appt or just the lab work she said. Please advised pt.She can be reached @ [phone]. Also her son has a 20 month old baby that needs a Lexicographer. She would like a recommendation. Thank you

## 2014-04-17 NOTE — Telephone Encounter (Signed)
That is fine to come in for a1c only - but would like be cheaper for her if she went to [facility] to have this drawn. I like [employer] - it is down [address] a ways.

## 2014-04-17 NOTE — Telephone Encounter (Signed)
Dr. Brigitte Pulse can this pt just come in for lab only or would you like to see her?

## 2014-04-19 NOTE — Telephone Encounter (Signed)
Order is pending. LM with information on pediatric recommendation- come in for Labs only visit.

## 2014-04-20 ENCOUNTER — Other Ambulatory Visit (INDEPENDENT_AMBULATORY_CARE_PROVIDER_SITE_OTHER): Payer: Medicare Other | Admitting: Radiology

## 2014-04-20 DIAGNOSIS — E1310 Other specified diabetes mellitus with ketoacidosis without coma: Secondary | ICD-10-CM

## 2014-04-20 DIAGNOSIS — E1110 Type 2 diabetes mellitus with ketoacidosis without coma: Secondary | ICD-10-CM

## 2014-04-20 LAB — POCT GLYCOSYLATED HEMOGLOBIN (HGB A1C): Hemoglobin A1C: 5.8

## 2014-04-20 NOTE — Progress Notes (Signed)
Pt here for labs only. 

## 2014-05-25 DIAGNOSIS — H2511 Age-related nuclear cataract, right eye: Secondary | ICD-10-CM | POA: Diagnosis not present

## 2014-06-24 DIAGNOSIS — H25012 Cortical age-related cataract, left eye: Secondary | ICD-10-CM | POA: Diagnosis not present

## 2014-06-24 DIAGNOSIS — H2512 Age-related nuclear cataract, left eye: Secondary | ICD-10-CM | POA: Diagnosis not present

## 2014-08-24 DIAGNOSIS — H2512 Age-related nuclear cataract, left eye: Secondary | ICD-10-CM | POA: Diagnosis not present

## 2014-10-14 ENCOUNTER — Ambulatory Visit (INDEPENDENT_AMBULATORY_CARE_PROVIDER_SITE_OTHER): Payer: Medicare Other | Admitting: Family Medicine

## 2014-10-14 ENCOUNTER — Other Ambulatory Visit: Payer: Self-pay | Admitting: Family Medicine

## 2014-10-14 ENCOUNTER — Telehealth: Payer: Self-pay

## 2014-10-14 VITALS — BP 108/72 | HR 80 | Temp 98.5°F | Resp 18 | Ht 62.5 in | Wt 120.0 lb

## 2014-10-14 DIAGNOSIS — N6019 Diffuse cystic mastopathy of unspecified breast: Secondary | ICD-10-CM

## 2014-10-14 DIAGNOSIS — W57XXXA Bitten or stung by nonvenomous insect and other nonvenomous arthropods, initial encounter: Secondary | ICD-10-CM | POA: Diagnosis not present

## 2014-10-14 DIAGNOSIS — Z1231 Encounter for screening mammogram for malignant neoplasm of breast: Secondary | ICD-10-CM

## 2014-10-14 DIAGNOSIS — R7309 Other abnormal glucose: Secondary | ICD-10-CM | POA: Diagnosis not present

## 2014-10-14 DIAGNOSIS — S20161A Insect bite (nonvenomous) of breast, right breast, initial encounter: Secondary | ICD-10-CM

## 2014-10-14 DIAGNOSIS — R7303 Prediabetes: Secondary | ICD-10-CM

## 2014-10-14 LAB — POCT GLYCOSYLATED HEMOGLOBIN (HGB A1C): Hemoglobin A1C: 5.8

## 2014-10-14 NOTE — Patient Instructions (Signed)
Schedule an appointment for your Welcome To Medicare physical.  Tick Bite Information Ticks are insects that attach themselves to the skin and draw blood for food. There are various types of ticks. Common types include wood ticks and deer ticks. Most ticks live in shrubs and grassy areas. Ticks can climb onto your body when you make contact with leaves or grass where the tick is waiting. The most common places on the body for ticks to attach themselves are the scalp, neck, armpits, waist, and groin. Most tick bites are harmless, but sometimes ticks carry germs that cause diseases. These germs can be spread to a person during the tick's feeding process. The chance of a disease spreading through a tick bite depends on:   The type of tick.  Time of year.   How long the tick is attached.   Geographic location.  HOW CAN YOU PREVENT TICK BITES? Take these steps to help prevent tick bites when you are outdoors:  Wear protective clothing. Long sleeves and long pants are best.   Wear white clothes so you can see ticks more easily.  Tuck your pant legs into your socks.   If walking on a trail, stay in the middle of the trail to avoid brushing against bushes.  Avoid walking through areas with long grass.  Put insect repellent on all exposed skin and along boot tops, pant legs, and sleeve cuffs.   Check clothing, hair, and skin repeatedly and before going inside.   Brush off any ticks that are not attached.  Take a shower or bath as soon as possible after being outdoors.  WHAT IS THE PROPER WAY TO REMOVE A TICK? Ticks should be removed as soon as possible to help prevent diseases caused by tick bites. 1. If latex gloves are available, put them on before trying to remove a tick.  2. Using fine-point tweezers, grasp the tick as close to the skin as possible. You may also use curved forceps or a tick removal tool. Grasp the tick as close to its head as possible. Avoid grasping the  tick on its body. 3. Pull gently with steady upward pressure until the tick lets go. Do not twist the tick or jerk it suddenly. This may break off the tick's head or mouth parts. 4. Do not squeeze or crush the tick's body. This could force disease-carrying fluids from the tick into your body.  5. After the tick is removed, wash the bite area and your hands with soap and water or other disinfectant such as alcohol. 6. Apply a small amount of antiseptic cream or ointment to the bite site.  7. Wash and disinfect any instruments that were used.  Do not try to remove a tick by applying a hot match, petroleum jelly, or fingernail polish to the tick. These methods do not work and may increase the chances of disease being spread from the tick bite.  WHEN SHOULD YOU SEEK MEDICAL CARE? Contact your health care provider if you are unable to remove a tick from your skin or if a part of the tick breaks off and is stuck in the skin.  After a tick bite, you need to be aware of signs and symptoms that could be related to diseases spread by ticks. Contact your health care provider if you develop any of the following in the days or weeks after the tick bite:  Unexplained fever.  Rash. A circular rash that appears days or weeks after the tick bite may  indicate the possibility of Lyme disease. The rash may resemble a target with a bull's-eye and may occur at a different part of your body than the tick bite.  Redness and swelling in the area of the tick bite.   Tender, swollen lymph glands.   Diarrhea.   Weight loss.   Cough.   Fatigue.   Muscle, joint, or bone pain.   Abdominal pain.   Headache.   Lethargy or a change in your level of consciousness.  Difficulty walking or moving your legs.   Numbness in the legs.   Paralysis.  Shortness of breath.   Confusion.   Repeated vomiting.  Document Released: 04/27/2000 Document Revised: 02/18/2013 Document Reviewed:  10/08/2012 [facility] Patient Information 2015 [place], [place]. This information is not intended to replace advice given to you by your health care provider. Make sure you discuss any questions you have with your health care provider.

## 2014-10-14 NOTE — Progress Notes (Signed)
Subjective:    Patient ID: Emily [name], female    DOB: Aug 21, 1950, 64 y.o.   MRN: [id number] This chart was scribed for Delman Cheadle, MD by Zola Button, Medical Scribe. This patient was seen in Room 5 and the patient's care was started at 11:47 AM.   HPI HPI Comments: Emily [name] is a 64 y.o. female with a hx of breast fibrocystic disorder who presents to the [facility] complaining of tick bite under her right breast that was first noticed earlier today. She did pull the tick off and has brought it with her to the office today in a jar. She is unsure when she was bitten or how long the tick has been there. Patient denies cough, SOB, rash, myalgias, fever and chills. She denies taking any OTC vitamins or supplements. She has an upcoming mammogram 3D.  She has been seen by [facility]. 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 Patient has a grandchild now.  Patient is not fasting.  Past Medical History  Diagnosis Date  . Anxiety   . Asthma   . Cataract   . Anemia     ?  . Bronchitis   . History of chicken pox   . Glucose intolerance (pre-diabetes)   . Glaucoma   . Hemorrhoid   . Chronic kidney disease   . Mitral valve prolapse   . Pneumonia   . Tuberculosis     exposed as a young child   Current Outpatient Prescriptions on File Prior to Visit  Medication Sig Dispense Refill  . aspirin 81 MG tablet Take 81 mg by mouth daily.    . cholecalciferol (VITAMIN D) 1000 UNITS tablet Take 1,000 Units by mouth daily.    Marland Kitchen ibuprofen (ADVIL) 200 MG tablet Take 3 tablets (600 mg total) by mouth 3 (three) times daily. Take for 3 days. Take with meals 30 tablet 0  . IRON PO Take 1 tablet by mouth daily.    . calcium acetate (PHOSLO) 667 MG capsule Take 667 mg by mouth daily.    . Cyanocobalamin (VITAMIN B-12 PO) Take by mouth 3 (three) times a week.    Marland Kitchen MAGNESIUM PO Take 1 tablet by mouth daily.    . Multiple Vitamins-Minerals (EYE VITAMINS) CAPS Take  by mouth daily. For macular degenaration     No current facility-administered medications on file prior to visit.   Allergies  Allergen Reactions  . Latex      Review of Systems  Constitutional: Positive for fatigue. Negative for fever, chills, activity change, appetite change and unexpected weight change.  Respiratory: Negative for cough, chest tightness and shortness of breath.   Cardiovascular: Negative for chest pain, palpitations and leg swelling.  Musculoskeletal: Positive for back pain and arthralgias. Negative for myalgias.  Skin: Negative for rash.  Neurological: Negative for dizziness, weakness and headaches.  Hematological: Negative for adenopathy. Does not bruise/bleed easily.  Psychiatric/Behavioral: Negative for sleep disturbance and dysphoric mood. The patient is not nervous/anxious.        Objective:  BP 108/72 mmHg  Pulse 80  Temp(Src) 98.5 F (36.9 C) (Oral)  Resp 18  Ht 5' 2.5" (1.588 m)  Wt 120 lb (54.432 kg)  BMI 21.59 kg/m2  SpO2 99%  Physical Exam  Constitutional: She is oriented to person, place, and time. She appears well-developed and well-nourished. No distress.  HENT:  Head: Normocephalic and atraumatic.  Mouth/Throat: Oropharynx is clear and moist. No oropharyngeal exudate.  Eyes: Pupils are equal, round, and reactive to light.  Neck: Neck supple.  Cardiovascular: Normal rate, regular rhythm, S1 normal, S2 normal and normal heart sounds.   Pulmonary/Chest: Effort normal and breath sounds normal. No respiratory distress. She has no wheezes. She has no rales.  Excellent air movement.  Genitourinary:  Some fibrocystic changes in right breast with poorly defined, soft, mobile mass approximately 11 o'clock near areola.  Musculoskeletal: She exhibits no edema.  Neurological: She is alert and oriented to person, place, and time. No cranial nerve deficit.  Skin: Skin is warm and dry. There is erythema.  Approximately 5-6 very small pinpoint  erythematous, blanching papules under right lateral ribs underneath breast. Pinpoint erythematous papule towards approximately 5 o'clock on right breast near the inferior mammary crease; very mild erythema; no induration, warmth, tenderness or foreign bodies.  Psychiatric: She has a normal mood and affect. Her behavior is normal.  Vitals reviewed.  Results for orders placed or performed in visit on 10/14/14  POCT glycosylated hemoglobin (Hb A1C)  Result Value Ref Range   Hemoglobin A1C 5.8           Assessment & Plan:   1. Prediabetes - still under excellent control, cont tlc  2. Tick bite of female breast, right, initial encounter - pt brought in the alive [place] tick, infor given on how to send in tick for free disease testing. she is asymtomatic at this time - call or RTC if any acute illness develops or rash  3. Fibrocystic breast, unspecified laterality - cont routine mammoframs   Pt recommended to sched for her welcome to medicare physical  Orders Placed This Encounter  Procedures  . POCT glycosylated hemoglobin (Hb A1C)    I personally performed the services described in this documentation, which was scribed in my presence. The recorded information has been reviewed and considered, and addended by me as needed.  Delman Cheadle, MD MPH

## 2014-10-14 NOTE — Telephone Encounter (Signed)
Patient called in last night to the answering service stating that she had found two ticks under her breast and would like to have a nurse call her back to discuss this issues, she states she is a patient of Dr. Brigitte Pulse.  Her call back number is [phone]

## 2014-10-14 NOTE — Telephone Encounter (Signed)
Left message for pt to call back  °

## 2014-10-15 NOTE — Telephone Encounter (Signed)
Pt came in to be seen

## 2014-10-20 DIAGNOSIS — H2000 Unspecified acute and subacute iridocyclitis: Secondary | ICD-10-CM | POA: Diagnosis not present

## 2014-10-25 ENCOUNTER — Ambulatory Visit (HOSPITAL_COMMUNITY)
Admission: RE | Admit: 2014-10-25 | Discharge: 2014-10-25 | Disposition: A | Discharge status: Home / Self Care | Payer: Medicare Other | Source: Ambulatory Visit | Attending: Family Medicine | Admitting: Family Medicine

## 2014-10-25 DIAGNOSIS — Z1231 Encounter for screening mammogram for malignant neoplasm of breast: Secondary | ICD-10-CM | POA: Diagnosis not present

## 2014-12-07 DIAGNOSIS — H2000 Unspecified acute and subacute iridocyclitis: Secondary | ICD-10-CM | POA: Diagnosis not present

## 2014-12-07 DIAGNOSIS — Z961 Presence of intraocular lens: Secondary | ICD-10-CM | POA: Diagnosis not present

## 2015-01-27 ENCOUNTER — Encounter: Payer: Self-pay | Admitting: Family Medicine

## 2015-01-27 ENCOUNTER — Ambulatory Visit (INDEPENDENT_AMBULATORY_CARE_PROVIDER_SITE_OTHER): Payer: Medicare Other | Admitting: Family Medicine

## 2015-01-27 VITALS — BP 96/58 | HR 75 | Temp 98.4°F | Resp 16 | Ht 62.5 in | Wt 120.0 lb

## 2015-01-27 DIAGNOSIS — Z0000 Encounter for general adult medical examination without abnormal findings: Secondary | ICD-10-CM | POA: Diagnosis not present

## 2015-01-27 DIAGNOSIS — Z780 Asymptomatic menopausal state: Secondary | ICD-10-CM

## 2015-01-27 DIAGNOSIS — Z1329 Encounter for screening for other suspected endocrine disorder: Secondary | ICD-10-CM

## 2015-01-27 DIAGNOSIS — M8580 Other specified disorders of bone density and structure, unspecified site: Secondary | ICD-10-CM

## 2015-01-27 DIAGNOSIS — Z1211 Encounter for screening for malignant neoplasm of colon: Secondary | ICD-10-CM | POA: Diagnosis not present

## 2015-01-27 DIAGNOSIS — Z1383 Encounter for screening for respiratory disorder NEC: Secondary | ICD-10-CM | POA: Diagnosis not present

## 2015-01-27 DIAGNOSIS — Z1389 Encounter for screening for other disorder: Secondary | ICD-10-CM

## 2015-01-27 DIAGNOSIS — E785 Hyperlipidemia, unspecified: Secondary | ICD-10-CM

## 2015-01-27 DIAGNOSIS — R7303 Prediabetes: Secondary | ICD-10-CM

## 2015-01-27 DIAGNOSIS — Z1239 Encounter for other screening for malignant neoplasm of breast: Secondary | ICD-10-CM | POA: Diagnosis not present

## 2015-01-27 DIAGNOSIS — D649 Anemia, unspecified: Secondary | ICD-10-CM | POA: Diagnosis not present

## 2015-01-27 DIAGNOSIS — Z113 Encounter for screening for infections with a predominantly sexual mode of transmission: Secondary | ICD-10-CM

## 2015-01-27 DIAGNOSIS — Z23 Encounter for immunization: Secondary | ICD-10-CM | POA: Diagnosis not present

## 2015-01-27 DIAGNOSIS — Z136 Encounter for screening for cardiovascular disorders: Secondary | ICD-10-CM

## 2015-01-27 DIAGNOSIS — R7309 Other abnormal glucose: Secondary | ICD-10-CM | POA: Diagnosis not present

## 2015-01-27 DIAGNOSIS — R911 Solitary pulmonary nodule: Secondary | ICD-10-CM | POA: Diagnosis not present

## 2015-01-27 DIAGNOSIS — Z130 Encounter for screening for diseases of the blood and blood-forming organs and certain disorders involving the immune mechanism: Secondary | ICD-10-CM

## 2015-01-27 DIAGNOSIS — N6019 Diffuse cystic mastopathy of unspecified breast: Secondary | ICD-10-CM | POA: Diagnosis not present

## 2015-01-27 DIAGNOSIS — Z124 Encounter for screening for malignant neoplasm of cervix: Secondary | ICD-10-CM | POA: Diagnosis not present

## 2015-01-27 DIAGNOSIS — Z1212 Encounter for screening for malignant neoplasm of rectum: Secondary | ICD-10-CM

## 2015-01-27 LAB — COMPREHENSIVE METABOLIC PANEL
ALK PHOS: 45 U/L (ref 33–130)
ALT: 12 U/L (ref 6–29)
AST: 17 U/L (ref 10–35)
Albumin: 4 g/dL (ref 3.6–5.1)
BUN: 21 mg/dL (ref 7–25)
CHLORIDE: 106 mmol/L (ref 98–110)
CO2: 28 mmol/L (ref 20–31)
Calcium: 9.3 mg/dL (ref 8.6–10.4)
Creat: 0.67 mg/dL (ref 0.50–0.99)
GLUCOSE: 93 mg/dL (ref 65–99)
POTASSIUM: 4.1 mmol/L (ref 3.5–5.3)
Sodium: 140 mmol/L (ref 135–146)
TOTAL PROTEIN: 6.8 g/dL (ref 6.1–8.1)
Total Bilirubin: 0.4 mg/dL (ref 0.2–1.2)

## 2015-01-27 LAB — CBC
HEMATOCRIT: 36.9 % (ref 36.0–46.0)
Hemoglobin: 12.2 g/dL (ref 12.0–15.0)
MCH: 27.3 pg (ref 26.0–34.0)
MCHC: 33.1 g/dL (ref 30.0–36.0)
MCV: 82.6 fL (ref 78.0–100.0)
MPV: 9 fL (ref 8.6–12.4)
Platelets: 219 10*3/uL (ref 150–400)
RBC: 4.47 MIL/uL (ref 3.87–5.11)
RDW: 14.3 % (ref 11.5–15.5)
WBC: 3.5 10*3/uL — AB (ref 4.0–10.5)

## 2015-01-27 LAB — HEPATITIS C ANTIBODY: HCV Ab: NEGATIVE

## 2015-01-27 LAB — MICROALBUMIN, URINE: MICROALB UR: 0.8 mg/dL (ref <2.0)

## 2015-01-27 LAB — POCT GLYCOSYLATED HEMOGLOBIN (HGB A1C): Hemoglobin A1C: 5.9

## 2015-01-27 LAB — LIPID PANEL
CHOL/HDL RATIO: 2.3 ratio (ref <=5.0)
Cholesterol: 229 mg/dL — ABNORMAL HIGH (ref 125–200)
HDL: 99 mg/dL (ref >=46)
LDL Cholesterol: 122 mg/dL (ref <130)
Triglycerides: 38 mg/dL (ref <150)
VLDL: 8 mg/dL (ref <30)

## 2015-01-27 MED ORDER — ZOSTER VACCINE LIVE 19400 UNT/0.65ML ~~LOC~~ SOLR: 0.6500 mL | Freq: Once | SUBCUTANEOUS | Status: DC

## 2015-01-27 NOTE — Patient Instructions (Signed)
Get your bone scan at the breast center. You can schedule your bone scan by calling the breast center at [facility] at [phone].  Bone Health Our bones do many things. They provide structure, protect organs, anchor muscles, and store calcium. Adequate calcium in your diet and weight-bearing physical activity help build strong bones, improve bone amounts, and may reduce the risk of weakening of bones (osteoporosis) later in life. PEAK BONE MASS By age 100, the average woman has acquired most of her skeletal bone mass. A large decline occurs in older adults which increases the risk of osteoporosis. In women this occurs around the time of menopause. It is important for young girls to reach their peak bone mass in order to maintain bone health throughout life. A person with high bone mass as a young adult will be more likely to have a higher bone mass later in life. Not enough calcium consumption and physical activity early on could result in a failure to achieve optimum bone mass in adulthood. OSTEOPOROSIS Osteoporosis is a disease of the bones. It is defined as low bone mass with deterioration of bone structure. Osteoporosis leads to an increase risk of fractures with falls. These fractures commonly happen in the wrist, hip, and spine. While men and women of all ages and background can develop osteoporosis, some of the risk factors for osteoporosis are:  Female.  White.  Postmenopausal.  Older adults.  Small in body size.  Eating a diet low in calcium.  Physically inactive.  Smoking.  Use of some medications.  Family history. CALCIUM Calcium is a mineral needed by the body for healthy bones, teeth, and proper function of the heart, muscles, and nerves. The body cannot produce calcium so it must be absorbed through food. Good sources of calcium include:  Dairy products (low fat or nonfat milk, cheese, and yogurt).  Dark green leafy vegetables (bok choy and  broccoli).  Calcium fortified foods (orange juice, cereal, bread, soy beverages, and tofu products).  Nuts (almonds). Recommended amounts of calcium vary for individuals. RECOMMENDED CALCIUM INTAKES Age and Amount in mg per day  Children 1 to 3 years / 700 mg  Children 4 to 8 years / 1,000 mg  Children 9 to 13 years / 1,300 mg  Teens 14 to 18 years / 1,300 mg  Adults 19 to 50 years / 1,000 mg  Adult women 51 to 70 years / 1,200 mg  Adults 71 years and older / 1,200 mg  Pregnant and breastfeeding teens / 1,300 mg  Pregnant and breastfeeding adults / 1,000 mg Vitamin D also plays an important role in healthy bone development. Vitamin D helps in the absorption of calcium. WEIGHT-BEARING PHYSICAL ACTIVITY Regular physical activity has many positive health benefits. Benefits include strong bones. Weight-bearing physical activity early in life is important in reaching peak bone mass. Weight-bearing physical activities cause muscles and bones to work against gravity. Some examples of weight bearing physical activities include:  Walking, jogging, or running.  [employer].  Jumping rope.  Dancing.  Soccer.  Tennis or Racquetball.  Stair climbing.  Basketball.  Hiking.  Weight lifting.  Aerobic fitness classes. Including weight-bearing physical activity into an exercise plan is a great way to keep bones healthy. Adults: Engage in at least 30 minutes of moderate physical activity on most, preferably all, days of the week. Children: Engage in at least 60 minutes of moderate physical activity on most, preferably all, days of the week. FOR MORE INFORMATION [place] Department  of [place] for [employer] and Promotion: www.cnpp.usda.[place]: EquipmentWeekly.com.ee Document Released: 07/21/2003 Document Revised: 08/25/2012 Document Reviewed: 10/20/2008 [facility] Patient Information 2015 [place], [place]. This information is not intended  to replace advice given to you by your health care provider. Make sure you discuss any questions you have with your health care provider. Health Recommendations for Postmenopausal Women Respected and ongoing research has looked at the most common causes of death, disability, and poor quality of life in postmenopausal women. The causes include heart disease, diseases of blood vessels, diabetes, depression, cancer, and bone loss (osteoporosis). Many things can be done to help lower the chances of developing these and other common problems. CARDIOVASCULAR DISEASE Heart Disease: A heart attack is a medical emergency. Know the signs and symptoms of a heart attack. Below are things women can do to reduce their risk for heart disease.   Do not smoke. If you smoke, quit.  Aim for a healthy weight. Being overweight causes many preventable deaths. Eat a healthy and balanced diet and drink an adequate amount of liquids.  Get moving. Make a commitment to be more physically active. Aim for 30 minutes of activity on most, if not all days of the week.  Eat for heart health. Choose a diet that is low in saturated fat and cholesterol and eliminate trans fat. Include whole grains, vegetables, and fruits. Read and understand the labels on food containers before buying.  Know your numbers. Ask your caregiver to check your blood pressure, cholesterol (total, HDL, LDL, triglycerides) and blood glucose. Work with your caregiver on improving your entire clinical picture.  High blood pressure. Limit or stop your table salt intake (try salt substitute and food seasonings). Avoid salty foods and drinks. Read labels on food containers before buying. Eating well and exercising can help control high blood pressure. STROKE  Stroke is a medical emergency. Stroke may be the result of a blood clot in a blood vessel in the brain or by a brain hemorrhage (bleeding). Know the signs and symptoms of a stroke. To lower the risk of  developing a stroke:  Avoid fatty foods.  Quit smoking.  Control your diabetes, blood pressure, and irregular heart rate. THROMBOPHLEBITIS (BLOOD CLOT) OF THE LEG  Becoming overweight and leading a stationary lifestyle may also contribute to developing blood clots. Controlling your diet and exercising will help lower the risk of developing blood clots. CANCER SCREENING  Breast Cancer: Take steps to reduce your risk of breast cancer.  You should practice "breast self-awareness." This means understanding the normal appearance and feel of your breasts and should include breast self-examination. Any changes detected, no matter how small, should be reported to your caregiver.  After age 73, you should have a clinical breast exam (CBE) every year.  Starting at age 23, you should consider having a mammogram (breast X-ray) every year.  If you have a family history of breast cancer, talk to your caregiver about genetic screening.  If you are at high risk for breast cancer, talk to your caregiver about having an MRI and a mammogram every year.  Intestinal or Stomach Cancer: Tests to consider are a rectal exam, fecal occult blood, sigmoidoscopy, and colonoscopy. Women who are high risk may need to be screened at an earlier age and more often.  Cervical Cancer:  Beginning at age 23, you should have a Pap test every 3 years as long as the past 3 Pap tests have been normal.  If you have had  past treatment for cervical cancer or a condition that could lead to cancer, you need Pap tests and screening for cancer for at least 20 years after your treatment.  If you had a hysterectomy for a problem that was not cancer or a condition that could lead to cancer, then you no longer need Pap tests.  If you are between ages 41 and 27, and you have had normal Pap tests going back 10 years, you no longer need Pap tests.  If Pap tests have been discontinued, risk factors (such as a new sexual partner) need to  be reassessed to determine if screening should be resumed.  Some medical problems can increase the chance of getting cervical cancer. In these cases, your caregiver may recommend more frequent screening and Pap tests.  Uterine Cancer: If you have vaginal bleeding after reaching menopause, you should notify your caregiver.  Ovarian Cancer: Other than yearly pelvic exams, there are no reliable tests available to screen for ovarian cancer at this time except for yearly pelvic exams.  Lung Cancer: Yearly chest X-rays can detect lung cancer and should be done on high risk women, such as cigarette smokers and women with chronic lung disease (emphysema).  Skin Cancer: A complete body skin exam should be done at your yearly examination. Avoid overexposure to the sun and ultraviolet light lamps. Use a strong sun block cream when in the sun. All of these things are important for lowering the risk of skin cancer. MENOPAUSE Menopause Symptoms: Hormone therapy products are effective for treating symptoms associated with menopause:  Moderate to severe hot flashes.  Night sweats.  Mood swings.  Headaches.  Tiredness.  Loss of sex drive.  Insomnia.  Other symptoms. Hormone replacement carries certain risks, especially in older women. Women who use or are thinking about using estrogen or estrogen with progestin treatments should discuss that with their caregiver. Your caregiver will help you understand the benefits and risks. The ideal dose of hormone replacement therapy is not known. The Food and Drug Administration (FDA) has concluded that hormone therapy should be used only at the lowest doses and for the shortest amount of time to reach treatment goals.  OSTEOPOROSIS Protecting Against Bone Loss and Preventing Fracture If you use hormone therapy for prevention of bone loss (osteoporosis), the risks for bone loss must outweigh the risk of the therapy. Ask your caregiver about other medications  known to be safe and effective for preventing bone loss and fractures. To guard against bone loss or fractures, the following is recommended:  If you are younger than age 50, take 1000 mg of calcium and at least 600 mg of Vitamin D per day.  If you are older than age 37 but younger than age 42, take 1200 mg of calcium and at least 600 mg of Vitamin D per day.  If you are older than age 65, take 1200 mg of calcium and at least 800 mg of Vitamin D per day. Smoking and excessive alcohol intake increases the risk of osteoporosis. Eat foods rich in calcium and vitamin D and do weight bearing exercises several times a week as your caregiver suggests. DIABETES Diabetes Mellitus: If you have type I or type 2 diabetes, you should keep your blood sugar under control with diet, exercise, and recommended medication. Avoid starchy and fatty foods, and too many sweets. Being overweight can make diabetes control more difficult. COGNITION AND MEMORY Cognition and Memory: Menopausal hormone therapy is not recommended for the prevention of cognitive  disorders such as Alzheimer's disease or memory loss.  DEPRESSION  Depression may occur at any age, but it is common in elderly women. This may be because of physical, medical, social (loneliness), or financial problems and needs. If you are experiencing depression because of medical problems and control of symptoms, talk to your caregiver about this. Physical activity and exercise may help with mood and sleep. Community and volunteer involvement may improve your sense of value and worth. If you have depression and you feel that the problem is getting worse or becoming severe, talk to your caregiver about which treatment options are best for you. ACCIDENTS  Accidents are common and can be serious in elderly woman. Prepare your house to prevent accidents. Eliminate throw rugs, place hand bars in bath, shower, and toilet areas. Avoid wearing high heeled shoes or walking on  wet, snowy, and icy areas. Limit or stop driving if you have vision or hearing problems, or if you feel you are unsteady with your movements and reflexes. HEPATITIS C Hepatitis C is a type of viral infection affecting the liver. It is spread mainly through contact with blood from an infected person. It can be treated, but if left untreated, it can lead to severe liver damage over the years. Many people who are infected do not know that the virus is in their blood. If you are a "baby-boomer", it is recommended that you have one screening test for Hepatitis C. IMMUNIZATIONS  Several immunizations are important to consider having during your senior years, including:   Tetanus, diphtheria, and pertussis booster shot.  Influenza every year before the flu season begins.  Pneumonia vaccine.  Shingles vaccine.  Others, as indicated based on your specific needs. Talk to your caregiver about these. Document Released: 06/22/2005 Document Revised: 09/14/2013 Document Reviewed: 02/16/2008 [facility] Patient Information 2015 [place], [place]. This information is not intended to replace advice given to you by your health care provider. Make sure you discuss any questions you have with your health care provider.

## 2015-01-27 NOTE — Progress Notes (Signed)
Subjective:    Emily [name] is a 64 y.o. female who presents for a Welcome to Medicare exam.  Pt's daughter has had a baby sev mos ago - in [place], pt was up there for 2 wks, also has an older grandson Has been having more picnics so she knows why a1c is up a little - drinking more soda.  Had cataract sug in Jan and then March - sees Herbert Deaner who referred her to another dr.  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  There 64 yo were to much for her   Review of Systems As reviewed above.         Objective:    BP 96/58 mmHg  Pulse 75  Temp(Src) 98.4 F (36.9 C) (Oral)  Resp 16  Ht 5' 2.5" (1.588 m)  Wt 120 lb (54.432 kg)  BMI 21.59 kg/m2  Medications Not taking aspirin or vit B, is taking cod liver oil and occ vit D.  Outpatient Encounter Prescriptions as of 01/27/2015  Medication Sig  . aspirin 81 MG tablet Take 81 mg by mouth daily.  . calcium acetate (PHOSLO) 667 MG capsule Take 667 mg by mouth daily.  . cholecalciferol (VITAMIN D) 1000 UNITS tablet Take 1,000 Units by mouth daily.  . Cyanocobalamin (VITAMIN B-12 PO) Take by mouth 3 (three) times a week.  Marland Kitchen ibuprofen (ADVIL) 200 MG tablet Take 3 tablets (600 mg total) by mouth 3 (three) times daily. Take for 3 days. Take with meals  . IRON PO Take 1 tablet by mouth daily.  Marland Kitchen MAGNESIUM PO Take 1 tablet by mouth daily.  . Multiple Vitamins-Minerals (EYE VITAMINS) CAPS Take by mouth daily. For macular degenaration   No facility-administered encounter medications on file as of 01/27/2015.     History: Past Medical History  Diagnosis Date  . Anxiety   . Asthma   . Cataract   . Anemia     ?  . Bronchitis   . History of chicken pox   . Glucose intolerance (pre-diabetes)   . Glaucoma   . Hemorrhoid   . Chronic kidney disease   . Mitral valve prolapse   . Pneumonia   . Tuberculosis     exposed as a young child   Past Surgical History  Procedure Laterality Date  . Fracture surgery        Family History  Problem Relation Age of Onset  . Diabetes Brother   . Hypertension Brother   . Alcohol abuse Brother   . Mental illness Brother     nervous breakdown  . Stroke Maternal Grandmother   . Depression Paternal Grandmother   . Arthritis Mother   . COPD Father 58    decsd due to lack of oxygen  . Hypertension Father   . Alcohol abuse Father   . Gout Father   . Alcohol abuse Brother   . Hypertension Brother    Social History   Occupational History  . Not on file.   Social History Main Topics  . Smoking status: Never Smoker   . Smokeless tobacco: Never Used  . Alcohol Use: Yes  . Drug Use: No  . Sexual Activity: No    Tobacco Counseling Counseling given: Not Answered   Immunizations and Health Maintenance Immunization History  Administered Date(s) Administered  . Influenza-Unspecified 01/29/2013  flu shot and prevnar today, gave rx for shingles. Health Maintenance Due  Topic Date Due  . Hepatitis C Screening  Apr 12, 1951  .  OPHTHALMOLOGY EXAM  04/18/1959  . URINE MICROALBUMIN  04/18/1959  . ZOSTAVAX  04/17/2009  . PNA vac Low Risk Adult (1 of 2 - PCV13) 04/17/2014  . FOOT EXAM  04/24/2014  . INFLUENZA VACCINE  12/13/2014    Activities of Daily Living  living alone w/o any problems.  Physical Exam: nml, or other factors deemed appropriate based on the beneficiary's medical and social history and current clinical standards.  Advanced Directives: she does not have this but plans on making this   Assessment:    This is a routine wellness examination for this patient .   Vision/Hearing screen  Visual Acuity Screening   Right eye Left eye Both eyes  Without correction: 20/50 20/30 20/25   With correction:      Sees Dr. 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 Trying to eat healthy  Goals    None     Depression Screen PHQ 2/9 Scores 10/14/2014 09/25/2013   PHQ - 2 Score 0 0     Fall Risk Fall Risk  01/06/2014  Falls in the past year? No    MMSE: No flowsheet data found. No prob w/ memory. Notices more prob when she gets stressed  Patient Care Team: Lance Bosch, NP as PCP - General (Internal Medicine)   Dr. Herbert Deaner  EKG: NSR, no ischemic changes Plan:     During the course of the visit the patient was educated and counseled about the following appropriate screening and preventive services:   Vaccines to include Pneumoccal, Influenza, Hepatitis B, Td, Zostavax, HCV - had f/u shot 2 yrs ago, none others. TDaP UTD as done in 2009. Pt recommended to RTC immed if laceration from metal or bite for TDaP.  Prevnar today, give rx for zostavax.  Electrocardiogram done today, compared to done 07/2012.  Cardiovascular Disease - pt has h/o recurrent atypical CP but no prior cards referral and cp has comltely resolved - pt attribted to anxiety. + HPL last yr instructed to work on tlc   Colorectal cancer screening - was referred to [place] over a yr prev but pt never scheduled, colonoscopy was done 2009 but do not have results in chart - pt thinks she had it done at [facility] and thinks she was told to repeat it in 5 yrs.  Bone density screening - nml vit D 1 yr prior, dexa scan done 03/19/2011 and l spine t score -1.3 (osteopenia) but do not see full reports - done at breast center, due for repeat  Diabetes screening - has pre-DM with a1c 5.8 3 mos ago - pt keeps very close watch on this and keeps a constant focus on tlc. Now increased to 5.9 but pt going to work on diet.  Glaucoma screening - does have h/o glaucoma and cataracts sees Dr. Herbert Deaner - has an appt on 10/14.  Mammography/PAP - neg HIV last yr, normal pap last yr at 21 y so no further screening paps needed. Nml mammogram done 10/2014 3 mos prior.  Did have fibroids and benign endometrial polyps removed in 2000  Nutrition counseling - working on diet to lower chol and lipids  Her  current medications and allergies were reviewed and needed refills of her chronic medications were ordered. The plan for yearly health maintenance was discussed and all orders and referrals were made as appropriate.  Right upper lobe Lung nodule first noted on CXR of 9.5  mm on xray 01/2012 so f/u chest CT showed 66mm subpleural RUL nodule. Repeat CXR 01/2013 did not show any nodule and repeat chest CT 01/2013 showed that RUL nodule was stable and no further w/u needed since pt is at low risk for lung cancer.   Mild anemia last yr. Resolved and now very mild leukopenia - recheck next year. ASCVD risk is 4% below the usual risk of 4.9% so pt is doing quite well on her current lifestyle regimen, continue.   Patient Instructions (the written plan) was given to the patient.   1. Welcome to Medicare preventive visit   2. Prediabetes   3. Incidental lung nodule, > 52mm and < 33mm   4. Breast fibrocystic disorder, unspecified laterality   5. Need for prophylactic vaccination and inoculation against unspecified single disease   6. Screening for thyroid disorder   7. Screening for deficiency anemia   8. Screening for colorectal cancer   9. Screening for cervical cancer   10. Screening for cardiovascular, respiratory, and genitourinary diseases   11. Screening for breast cancer   12. Routine screening for STI (sexually transmitted infection)   23. Hyperlipidemia   14. Anemia, unspecified anemia type   15. Osteopenia   16. Postmenopausal estrogen deficiency   17. Need for immunization against influenza   18. Need for prophylactic vaccination against Streptococcus pneumoniae (pneumococcus)     Orders Placed This Encounter  Procedures  . DG Bone Density    Standing Status: Future     Number of Occurrences:      Standing Expiration Date: 03/28/2016    Order Specific Question:  Reason for Exam (SYMPTOM  OR DIAGNOSIS REQUIRED)    Answer:  f/u osteopenia from 2012    Order Specific Question:   Preferred imaging location?    Answer:  [facility]  . Flu Vaccine QUAD 36+ mos IM (Fluarix)  . Pneumococcal conjugate vaccine 13-valent IM  . Comprehensive metabolic panel    Order Specific Question:  Has the patient fasted?    Answer:  Yes  . Lipid panel    Order Specific Question:  Has the patient fasted?    Answer:  Yes  . CBC  . Hepatitis C antibody  . Microalbumin, urine  . POCT glycosylated hemoglobin (Hb A1C)  . EKG 12-Lead    Meds ordered this encounter  Medications  . zoster vaccine live, PF, (ZOSTAVAX) 93570 UNT/0.65ML injection    Sig: Inject 19,400 Units into the skin once.    Dispense:  1 each    Refill:  0  Crit Obremski, MD 01/27/2015

## 2015-01-28 ENCOUNTER — Encounter: Payer: Medicare Other | Admitting: Family Medicine

## 2015-01-31 ENCOUNTER — Encounter: Payer: Self-pay | Admitting: Family Medicine

## 2015-02-02 DIAGNOSIS — Z961 Presence of intraocular lens: Secondary | ICD-10-CM | POA: Diagnosis not present

## 2015-02-02 DIAGNOSIS — E119 Type 2 diabetes mellitus without complications: Secondary | ICD-10-CM | POA: Diagnosis not present

## 2015-02-02 DIAGNOSIS — H3531 Nonexudative age-related macular degeneration: Secondary | ICD-10-CM | POA: Diagnosis not present

## 2015-02-02 DIAGNOSIS — H26491 Other secondary cataract, right eye: Secondary | ICD-10-CM | POA: Diagnosis not present

## 2015-02-08 ENCOUNTER — Ambulatory Visit (INDEPENDENT_AMBULATORY_CARE_PROVIDER_SITE_OTHER): Payer: Medicare Other | Admitting: Family Medicine

## 2015-02-08 ENCOUNTER — Ambulatory Visit (INDEPENDENT_AMBULATORY_CARE_PROVIDER_SITE_OTHER): Payer: Medicare Other

## 2015-02-08 VITALS — BP 106/66 | HR 69 | Temp 97.9°F | Resp 16 | Ht 62.0 in | Wt 118.2 lb

## 2015-02-08 DIAGNOSIS — M5412 Radiculopathy, cervical region: Secondary | ICD-10-CM

## 2015-02-08 DIAGNOSIS — L98491 Non-pressure chronic ulcer of skin of other sites limited to breakdown of skin: Secondary | ICD-10-CM

## 2015-02-08 MED ORDER — METHOCARBAMOL 500 MG PO TABS: 500.0000 mg | ORAL_TABLET | Freq: Four times a day (QID) | ORAL | Status: DC

## 2015-02-08 MED ORDER — MUPIROCIN 2 % EX OINT: 1.0000 "application " | TOPICAL_OINTMENT | Freq: Two times a day (BID) | CUTANEOUS | Status: DC

## 2015-02-08 MED ORDER — MELOXICAM 7.5 MG PO TABS: 7.5000 mg | ORAL_TABLET | Freq: Every day | ORAL | Status: DC

## 2015-02-08 NOTE — Progress Notes (Signed)
Subjective:  This chart was scribed for Delman Cheadle, MD by Leandra Kern, Medical Scribe. This patient was seen in Room 8 and the patient's care was started at 1:30 PM.   Patient ID: Emily [name], female    DOB: 12/26/1950, 64 y.o.   MRN: [id number]  Chief Complaint  Patient presents with  . Sores/ulcers on  Rt lower leg x 3 days  . Neck Pain     the pain goes down to Rt Shoulder / upper arm -NKI-    HPI HPI Comments: Emily [name] is a 64 y.o. female who presents to [facility] complaining of ulcers on the right lower leg, onset three days ago.  I saw pt 2 weeks ago for her Welcome to Medicare exam, at that time she seemed to be doing quite well. She was given Prevnar immunization prescription for her shingles vaccine, otherwise she was doing very well.   Pt notes that the ulcer could be a result of an insect bite. She states that applied bacitracin to the area. She denies itchiness of the area.   Pt reports that she is presenting of neck pain that is radiating to her arm, onset about a year ago. Pt notes that she has weakness in the arm and trouble grasping. Pt takes Advil for the symptoms. Pt recalls that never had X-rays done on the area. She denies numbness or tingling of the area.    Patient Active Problem List   Diagnosis Date Noted  . Prediabetes 11/21/2012  . Breast fibrocystic disorder 11/21/2012  . Chest pain 11/21/2012  . Anxiety state, unspecified 11/21/2012  . Incidental lung nodule, > 43mm and < 75mm 02/15/2012   Past Medical History  Diagnosis Date  . Anxiety   . Asthma   . Cataract   . Anemia     ?  . Bronchitis   . History of chicken pox   . Glucose intolerance (pre-diabetes)   . Glaucoma   . Hemorrhoid   . Chronic kidney disease   . Mitral valve prolapse   . Pneumonia   . Tuberculosis     exposed as a young child  . History of uterine fibroid    Past Surgical History  Procedure Laterality Date  . Fracture surgery     Allergies    Allergen Reactions  . Latex    Prior to Admission medications   Medication Sig Start Date End Date Taking? Authorizing Provider  aspirin 81 MG tablet Take 81 mg by mouth daily.   Yes Historical Provider, MD  cholecalciferol (VITAMIN D) 1000 UNITS tablet Take 1,000 Units by mouth daily.   Yes Historical Provider, MD  Cyanocobalamin (VITAMIN B-12 PO) Take by mouth 3 (three) times a week.   Yes Historical Provider, MD  ibuprofen (ADVIL) 200 MG tablet Take 3 tablets (600 mg total) by mouth 3 (three) times daily. Take for 3 days. Take with meals 04/29/12  Yes Shanker Kristeen Mans, MD  IRON PO Take 1 tablet by mouth daily.   Yes Historical Provider, MD  Multiple Vitamins-Minerals (EYE VITAMINS) CAPS Take by mouth daily. For macular degenaration   Yes Historical Provider, MD  zoster vaccine live, PF, (ZOSTAVAX) 22297 UNT/0.65ML injection Inject 19,400 Units into the skin once. Patient not taking: Reported on 02/08/2015 01/27/15   Shawnee Knapp, MD   Social History   Social History  . Marital Status: Divorced    Spouse Name: N/A  . Number of Children: N/A  .  Years of Education: N/A   Occupational History  . Not on file.   Social History Main Topics  . Smoking status: Never Smoker   . Smokeless tobacco: Never Used  . Alcohol Use: Yes  . Drug Use: No  . Sexual Activity: No   Other Topics Concern  . Not on file   Social History Narrative   Depression screen [facility] 2/9 02/08/2015 01/27/2015 10/14/2014 09/25/2013  Decreased Interest 0 0 0 0  Down, Depressed, Hopeless 0 0 0 0  PHQ - 2 Score 0 0 0 0       Review of Systems  Constitutional: Positive for fatigue. Negative for fever, chills, diaphoresis, activity change, appetite change and unexpected weight change.  Cardiovascular: Negative for leg swelling.  Endocrine: Negative for polyuria.  Genitourinary: Negative for dysuria and difficulty urinating.  Musculoskeletal: Positive for myalgias, arthralgias and neck pain. Negative for joint  swelling, gait problem and neck stiffness.  Skin: Positive for color change and wound.  Neurological: Positive for weakness (right arm). Negative for numbness.  Hematological: Negative for adenopathy.  Psychiatric/Behavioral: Negative for dysphoric mood.       Objective:   Physical Exam  Constitutional: She is oriented to person, place, and time. She appears well-developed and well-nourished. No distress.  HENT:  Head: Normocephalic and atraumatic.  Eyes: EOM are normal. Pupils are equal, round, and reactive to light.  Neck: Neck supple.  Cardiovascular: Normal rate.   Pulses:      Radial pulses are 2+ on the right side, and 2+ on the left side.  2+ ulnar pulses.  Pulmonary/Chest: Effort normal.  Musculoskeletal:  No specific pain over the cervical para spinal muscle BL. AC joint is normal to palpation.  Lymphadenopathy:       Head (right side): No submandibular adenopathy present.       Head (left side): No submandibular adenopathy present.    She has no cervical adenopathy.       Right cervical: No posterior cervical adenopathy present.      Left cervical: No posterior cervical adenopathy present.  Neurological: She is alert and oriented to person, place, and time. No cranial nerve deficit.  Reflex Scores:      Tricep reflexes are 2+ on the right side and 1+ on the left side.      Bicep reflexes are 2+ on the right side and 1+ on the left side.      Brachioradialis reflexes are 2+ on the right side and 1+ on the left side. Biocept, triceps, and deltoids strength are 5/5 BL 5/5 resisted wrist flexion, extension, and lateral rotation. 5/5 opposition and grasp BL.   Skin: Skin is warm and dry.  Multiple pin point abrasions over the mid right anterior later shin. No sign induration. No fluxions, erythema or warmth. No specific pattern. No edema. No drainage.   Psychiatric: She has a normal mood and affect. Her behavior is normal.  Nursing note and vitals reviewed.  BP 106/66  mmHg  Pulse 69  Temp(Src) 97.9 F (36.6 C) (Oral)  Resp 16  Ht 5\' 2"  (1.575 m)  Wt 118 lb 3.2 oz (53.615 kg)  BMI 21.61 kg/m2  SpO2 99%  [facility] (PRIMARY) x-ray report read by Dr. Delman Cheadle: Neck- very slight retrospondylolisthesis at C5-6, and some mild degenerative change, but disk space relatively well preserved.   Dg Cervical Spine 2 Or 3 Views  02/08/2015   CLINICAL DATA:  Neck pain radiating into the arm for 1 year. Weakness.  No acute injury. Initial encounter.  EXAM: CERVICAL SPINE  4+ VIEWS  COMPARISON:  Chest CT 01/26/2013.  FINDINGS: The prevertebral soft tissues are normal. The alignment is anatomic through T1. There is no evidence of acute fracture or traumatic subluxation. The C1-2 articulation appears normal in the AP projection. The disc spaces are preserved. There is minimal intervertebral spurring and facet hypertrophy. Neural foraminal evaluation limited without oblique views.  IMPRESSION: No acute osseous findings or malalignment.  Mild spondylosis.   Electronically Signed   By: Richardean Sale M.D.   On: 02/08/2015 14:38       Assessment & Plan:   1. Cervical radiculitis   2. Skin ulcer, limited to breakdown of skin     Orders Placed This Encounter  Procedures  . DG Cervical Spine 2 or 3 views    Standing Status: Future     Number of Occurrences: 1     Standing Expiration Date: 02/08/2016    Order Specific Question:  Reason for Exam (SYMPTOM  OR DIAGNOSIS REQUIRED)    Answer:  neck and right arm pain, chronic, worsening    Order Specific Question:  Preferred imaging location?    Answer:  External    Meds ordered this encounter  Medications  . mupirocin ointment (BACTROBAN) 2 %    Sig: Apply 1 application topically 2 (two) times daily.    Dispense:  30 g    Refill:  1  . methocarbamol (ROBAXIN) 500 MG tablet    Sig: Take 1 tablet (500 mg total) by mouth 4 (four) times daily.    Dispense:  60 tablet    Refill:  1  . meloxicam (MOBIC) 7.5 MG tablet    Sig:  Take 1 tablet (7.5 mg total) by mouth daily.    Dispense:  30 tablet    Refill:  1    I personally performed the services described in this documentation, which was scribed in my presence. The recorded information has been reviewed and considered, and addended by me as needed.  Delman Cheadle, MD MPH    By signing my name below, I, Rawaa Al Rifaie, attest that this documentation has been prepared under the direction and in the presence of Delman Cheadle, MD.  Leandra Kern, Medical Scribe. 02/08/2015.  1:49 PM.

## 2015-02-08 NOTE — Patient Instructions (Signed)
Cervical Radiculopathy °Cervical radiculopathy means a nerve in the neck is pinched or bruised. This can cause pain or loss of feeling (numbness) that runs from your neck to your arm and fingers. °HOME CARE  °· Put ice on the injured or painful area. °¨ Put ice in a plastic bag. °¨ Place a towel between your skin and the bag. °¨ Leave the ice on for 15-20 minutes, 03-04 times a day, or as told by your doctor. °· If ice does not help, you can try using heat. Take a warm shower or bath, or use a hot water bottle as told by your doctor. °· You may try a gentle neck and shoulder massage. °· Use a flat pillow when you sleep. °· Only take medicines as told by your doctor. °· Keep all physical therapy visits as told by your doctor. °· If you are given a soft collar, wear it as told by your doctor. °GET HELP RIGHT AWAY IF:  °· Your pain gets worse and is not controlled with medicine. °· You lose feeling or feel weak in your hand, arm, face, or leg. °· You have a fever or stiff neck. °· You cannot control when you poop or pee (incontinence). °· You have trouble with walking, balance, or speaking. °MAKE SURE YOU:  °· Understand these instructions. °· Will watch your condition. °· Will get help right away if you are not doing well or get worse. °Document Released: 04/19/2011 Document Revised: 07/23/2011 Document Reviewed: 04/19/2011 °ExitCare® Patient Information ©2015 [employer]. This information is not intended to replace advice given to you by your health care provider. Make sure you discuss any questions you have with your health care provider. ° °

## 2015-02-09 ENCOUNTER — Other Ambulatory Visit: Payer: Medicare Other

## 2015-02-14 ENCOUNTER — Telehealth: Payer: Self-pay

## 2015-02-14 MED ORDER — TIZANIDINE HCL 2 MG PO CAPS: 2.0000 mg | ORAL_CAPSULE | Freq: Three times a day (TID) | ORAL | Status: DC | PRN

## 2015-02-14 NOTE — Telephone Encounter (Signed)
Please let pt know - I sent it in - it usually causes a lot more sedation than the methocarbamol so I sent in the smallest dose it comes in.  Try it at home at evening first.  If it does make her to sleepy, let us know so we can then complete PA. Thanks.

## 2015-02-14 NOTE — Telephone Encounter (Signed)
Dr Brigitte Pulse, pt's ins does not want to cover methocarbamol. The preferred med is tizanidine. Do you want to try that, or I can try a PA, but prob won't be approved until pt has tried/failed the tizanidine.

## 2015-02-15 ENCOUNTER — Telehealth: Payer: Self-pay

## 2015-02-15 DIAGNOSIS — Z1211 Encounter for screening for malignant neoplasm of colon: Secondary | ICD-10-CM

## 2015-02-15 NOTE — Telephone Encounter (Signed)
Spoke to pt and she stated that she had a colonoscopy done 6 yrs ago at [address], and Dr Brigitte Pulse was going to try to find out if she had any polyps and needs another now, or if she can go 10 years in between. I called [facility] and they said was done by Dr Wynetta Emery who is now at the [facility] location. I called them and [facility] is going to fax [place] the report, BUT it was done in 2005, not 6 yrs ago. LMOM for pt with this info and asked her to CB if she knows of another location she may have had done 6 yrs ago.

## 2015-02-15 NOTE — Telephone Encounter (Signed)
Emily [name] - you are an amazing woman as always, thank you for doing all this leg work - REALLY appreciated.

## 2015-02-15 NOTE — Telephone Encounter (Signed)
Called pt who reported that she already p/up the methocarbamol (paid OOP?). She asked if I knew a way to get her shingles vaccine any cheaper than $200 her pharm charges. It is not covered by her ins. I advised her to call ins and find out why it is not covered and/or when it would be. Pt agreed and I also gave her some names of other pharm in area to check w/if she has to pay cash.

## 2015-02-15 NOTE — Telephone Encounter (Signed)
General 09/28/2013 2:49 PM [name], Emily D        Note   LM on Vmail to CB to sch'd an appt     Can we try to call pt to schedule or does she need a new referral?

## 2015-02-15 NOTE — Telephone Encounter (Signed)
Pt said she spoke with Dr.Shaw concerning a colonoscopy and if it was time for her to get one done again. She states that Dr.Shaw is suppose to contact her with the information. Please call

## 2015-02-28 NOTE — Telephone Encounter (Signed)
Received copy of 04/10/2004 colonoscopy report that was normal so due to be repeated in 10 yrs so will refer back to Dr. Wynetta Emery for this. Please let pt know.

## 2015-03-04 ENCOUNTER — Encounter: Payer: Self-pay | Admitting: Family Medicine

## 2015-03-15 ENCOUNTER — Ambulatory Visit
Admission: RE | Admit: 2015-03-15 | Discharge: 2015-03-15 | Disposition: A | Discharge status: Home / Self Care | Payer: Medicare Other | Source: Ambulatory Visit | Attending: Family Medicine | Admitting: Family Medicine

## 2015-03-15 ENCOUNTER — Encounter: Payer: Self-pay | Admitting: Family Medicine

## 2015-03-15 DIAGNOSIS — Z780 Asymptomatic menopausal state: Secondary | ICD-10-CM | POA: Diagnosis not present

## 2015-03-15 DIAGNOSIS — M8580 Other specified disorders of bone density and structure, unspecified site: Secondary | ICD-10-CM

## 2015-04-10 ENCOUNTER — Ambulatory Visit (INDEPENDENT_AMBULATORY_CARE_PROVIDER_SITE_OTHER): Payer: Medicare Other

## 2015-04-10 ENCOUNTER — Ambulatory Visit (INDEPENDENT_AMBULATORY_CARE_PROVIDER_SITE_OTHER): Payer: Medicare Other | Admitting: Family Medicine

## 2015-04-10 VITALS — BP 110/70 | HR 83 | Temp 98.4°F | Resp 16 | Ht 62.0 in | Wt 120.6 lb

## 2015-04-10 DIAGNOSIS — I8393 Asymptomatic varicose veins of bilateral lower extremities: Secondary | ICD-10-CM

## 2015-04-10 DIAGNOSIS — M25562 Pain in left knee: Secondary | ICD-10-CM

## 2015-04-10 DIAGNOSIS — M25561 Pain in right knee: Secondary | ICD-10-CM

## 2015-04-10 MED ORDER — MELOXICAM 7.5 MG PO TABS: 7.5000 mg | ORAL_TABLET | Freq: Every day | ORAL | Status: DC

## 2015-04-10 NOTE — Progress Notes (Signed)
 Chief Complaint:  Chief Complaint  Patient presents with  . other    pt would like to check her circulation in her ankle and legs. left leg/ x 4 months  . Medication Refill    meloxicam    HPI: Emily [name] is a 64 y.o. female who reports to [facility] today complaining of left  Lower leg pain x 6 months since July She has it more constant than before, not painful when walking . She wears good shoes , NKI It was on right prior but was radiating, now on left. She ahs varicose veins.  She is prediabetic but denies neuropathy, she has had prior right leg surgery for fracture  She deos not have CKD based on labs  She is a nonsmoker She has low/no risk for DVT/PE  Past Medical History  Diagnosis Date  . Anxiety   . Asthma   . Cataract   . Anemia     ?  . Bronchitis   . History of chicken pox   . Glucose intolerance (pre-diabetes)   . Glaucoma   . Hemorrhoid   . Chronic kidney disease   . Mitral valve prolapse   . Pneumonia   . Tuberculosis     exposed as a young child  . History of uterine fibroid    Past Surgical History  Procedure Laterality Date  . Fracture surgery     Social History   Social History  . Marital Status: Divorced    Spouse Name: N/A  . Number of Children: N/A  . Years of Education: N/A   Social History Main Topics  . Smoking status: Never Smoker   . Smokeless tobacco: Never Used  . Alcohol Use: Yes  . Drug Use: No  . Sexual Activity: No   Other Topics Concern  . None   Social History Narrative   Family History  Problem Relation Age of Onset  . Diabetes Brother   . Hypertension Brother   . Alcohol abuse Brother   . Mental illness Brother     nervous breakdown  . Stroke Maternal Grandmother   . Depression Paternal Grandmother   . Arthritis Mother   . COPD Father 48    decsd due to lack of oxygen  . Hypertension Father   . Alcohol abuse Father   . Gout Father   . Alcohol abuse Brother   . Hypertension Brother    Allergies    Allergen Reactions  . Latex    Prior to Admission medications   Medication Sig Start Date End Date Taking? Authorizing Provider  aspirin 81 MG tablet Take 81 mg by mouth daily.   Yes Historical Provider, MD  cholecalciferol (VITAMIN D) 1000 UNITS tablet Take 1,000 Units by mouth daily.   Yes Historical Provider, MD  Cyanocobalamin (VITAMIN B-12 PO) Take by mouth 3 (three) times a week.   Yes Historical Provider, MD  IRON PO Take 1 tablet by mouth daily.   Yes Historical Provider, MD  meloxicam (MOBIC) 7.5 MG tablet Take 1 tablet (7.5 mg total) by mouth daily. 02/08/15  Yes Shawnee Knapp, MD  Multiple Vitamins-Minerals (EYE VITAMINS) CAPS Take by mouth daily. For macular degenaration   Yes Historical Provider, MD  ibuprofen (ADVIL) 200 MG tablet Take 3 tablets (600 mg total) by mouth 3 (three) times daily. Take for 3 days. Take with meals Patient not taking: Reported on 04/10/2015 04/29/12   Jonetta Osgood, MD  methocarbamol (ROBAXIN) 500 MG tablet  Take 1 tablet (500 mg total) by mouth 4 (four) times daily. Patient not taking: Reported on 04/10/2015 02/08/15   Shawnee Knapp, MD  mupirocin ointment (BACTROBAN) 2 % Apply 1 application topically 2 (two) times daily. Patient not taking: Reported on 04/10/2015 02/08/15   Shawnee Knapp, MD  tizanidine (ZANAFX) 2 MG capsule Take 1 capsule (2 mg total) by mouth 3 (three) times daily as needed for muscle spasms. Patient not taking: Reported on 04/10/2015 02/14/15   Shawnee Knapp, MD  zoster vaccine live, PF, (ZOSTAVAX) 60454 UNT/0.65ML injection Inject 19,400 Units into the skin once. Patient not taking: Reported on 02/08/2015 01/27/15   Shawnee Knapp, MD     ROS: The patient denies fevers, chills, night sweats, unintentional weight loss, chest pain, palpitations, wheezing, dyspnea on exertion, nausea, vomiting, abdominal pain, dysuria, hematuria, melena, numbness, weakness, or tingling.  All other systems have been reviewed and were otherwise negative with the  exception of those mentioned in the HPI and as above.    PHYSICAL EXAM: Filed Vitals:   04/10/15 1145  BP: 110/70  Pulse: 83  Temp: 98.4 F (36.9 C)  Resp: 16   Body mass index is 22.05 kg/(m^2).   General: Alert, no acute distress HEENT:  Normocephalic, atraumatic, oropharynx patent. EOMI, PERRLA Cardiovascular:  Regular rate and rhythm, no rubs murmurs or gallops.  No Carotid bruits, radial pulse intact. No pedal edema.  Respiratory: Clear to auscultation bilaterally.  No wheezes, rales, or rhonchi.  No cyanosis, no use of accessory musculature Abdominal: No organomegaly, abdomen is soft and non-tender, positive bowel sounds. No masses. Skin: No rashes. Neurologic: Facial musculature symmetric. Psychiatric: Patient acts appropriately throughout our interaction. Lymphatic: No cervical or submandibular lymphadenopathy Musculoskeletal: Gait intact. No edema Back  Full ROM 5/5 strength, 2/2 DTRs No saddle anesthesia Straight leg negative Hip and knee exam--normal  Tender along medial lower  right tib fib area  + DP, + PTA + varcosities Neg Homan, neg calf asymmetry 5/5 strength, 2/2 DTRs in ankles Skin intact no ulcers Sensation intact   LABS: Results for orders placed or performed in visit on 03/04/15  HM COLONOSCOPY  Result Value Ref Range   HM Colonoscopy      normal proctocolonoscopy to the cecum.  no endoscopic evidence for the presence of colorectal neoplasia     EKG/XRAY:   Primary read interpreted by Dr. Marin Comment at [facility]. Neg for fx or dislocation   ASSESSMENT/PLAN: Encounter Diagnoses  Name Primary?  Marland Kitchen Arthralgia of left lower leg Yes  . Varicose veins of lower extremities with ulcer, left ([place])   . Arthralgia of both lower legs    Refer to get ABI and arterial study of , patient would like "circulation study". I am not sure if venous dopplers are warranted, she no e.o DT and superficial spider varicosities Recommedn compression socks, if needs rx then  will write one for her Refill meloxicam Fu prn   Gross sideeffects, risk and benefits, and alternatives of medications d/w patient. Patient is aware that all medications have potential sideeffects and we are unable to predict every sideeffect or drug-drug interaction that may occur.    DO  04/10/2015 1:26 PM

## 2015-04-11 ENCOUNTER — Other Ambulatory Visit: Payer: Self-pay

## 2015-04-12 ENCOUNTER — Other Ambulatory Visit: Payer: Self-pay | Admitting: Family Medicine

## 2015-04-12 ENCOUNTER — Ambulatory Visit (HOSPITAL_COMMUNITY)
Admission: RE | Admit: 2015-04-12 | Discharge: 2015-04-12 | Disposition: A | Discharge status: Home / Self Care | Payer: Medicare Other | Source: Ambulatory Visit | Attending: Family Medicine | Admitting: Family Medicine

## 2015-04-12 DIAGNOSIS — L97929 Non-pressure chronic ulcer of unspecified part of left lower leg with unspecified severity: Secondary | ICD-10-CM

## 2015-04-12 DIAGNOSIS — M25562 Pain in left knee: Secondary | ICD-10-CM | POA: Insufficient documentation

## 2015-04-12 DIAGNOSIS — M25561 Pain in right knee: Secondary | ICD-10-CM

## 2015-04-12 DIAGNOSIS — M25572 Pain in left ankle and joints of left foot: Secondary | ICD-10-CM | POA: Insufficient documentation

## 2015-04-12 DIAGNOSIS — I83029 Varicose veins of left lower extremity with ulcer of unspecified site: Secondary | ICD-10-CM | POA: Diagnosis not present

## 2015-04-12 NOTE — Progress Notes (Signed)
VASCULAR LAB PRELIMINARY  PRELIMINARY  PRELIMINARY  PRELIMINARY   VASCULAR LAB PRELIMINARY  ARTERIAL  ABI completed: Normal ABIs at rest.    RIGHT    LEFT    PRESSURE WAVEFORM  PRESSURE WAVEFORM  BRACHIAL 118 Triphasic BRACHIAL 117 Triphasic  DP 123 Triphasic DP 140   PT 124 Triphasic PT 132   GREAT TOE  NA GREAT TOE  NA    RIGHT LEFT  ABI 1.0 1.1    Emily [name] D, RVT 04/12/2015, 9:04 AM

## 2015-04-13 ENCOUNTER — Telehealth: Payer: Self-pay | Admitting: Family Medicine

## 2015-04-13 NOTE — Telephone Encounter (Signed)
Lm that abi study was normal

## 2015-05-03 ENCOUNTER — Telehealth: Payer: Self-pay

## 2015-05-03 NOTE — Telephone Encounter (Signed)
Support stockings needed  What weight  [phone]

## 2015-05-05 NOTE — Telephone Encounter (Signed)
Left message for pt to call back  °

## 2015-05-09 NOTE — Telephone Encounter (Signed)
Spoke with pt, she needs a refill on her Prempro and she needs support stockings. She feels she needs to get back on the Prempro because she is so skinny and feels her bones are brittle. Dr. Brigitte Pulse please advise. I can write Rx for stockings I just need approval. Thanks. (She saw Dr. Marin Comment but stated you are her doctor, if you rather have Dr. Marin Comment advise please route message)

## 2015-05-10 NOTE — Telephone Encounter (Signed)
Support socks/hose weight can be whatever is most comfortable for her but I would recommend she just get 1 pair that is medium grade - around 15-20 mmHg to see how they work. but a firm grade around 2mmHg would be fine to but they can be quite difficult to put on sometimes. There are a lot of products now online on [place] or at [employer] or a sporting [employer] (one pt found them at [employer]) for compression socks - even if made for sports - that are going to be [facility] less expensive but just as effective than purchasing a pair oat a medical supply store - $20-25 vs $60-80. For instance the sockwell brand or the go2 brand are very soft comfortable high quality and dramatically less expensive than when obtained with a rx through a medical supply store. Insurance rarely covers but she is welcome to try.  Would need to come in to discuss prempro - it is usually not a very good option unless people just recently went through menopause as it increases risk of uterine and breast cancer as well as heart attack and stroke.

## 2015-05-10 NOTE — Telephone Encounter (Signed)
Pt advised to RTC to discuss.

## 2015-05-27 ENCOUNTER — Other Ambulatory Visit: Payer: Self-pay | Admitting: Gastroenterology

## 2015-06-13 ENCOUNTER — Encounter (HOSPITAL_COMMUNITY): Payer: Self-pay | Admitting: *Deleted

## 2015-06-14 ENCOUNTER — Ambulatory Visit (INDEPENDENT_AMBULATORY_CARE_PROVIDER_SITE_OTHER): Payer: Medicare Other | Admitting: Family Medicine

## 2015-06-14 VITALS — BP 120/74 | HR 102 | Temp 98.6°F | Resp 17 | Ht 63.0 in | Wt 119.0 lb

## 2015-06-14 DIAGNOSIS — J329 Chronic sinusitis, unspecified: Secondary | ICD-10-CM

## 2015-06-14 DIAGNOSIS — R319 Hematuria, unspecified: Secondary | ICD-10-CM | POA: Diagnosis not present

## 2015-06-14 LAB — POCT URINALYSIS DIP (MANUAL ENTRY)
BILIRUBIN UA: NEGATIVE
Glucose, UA: NEGATIVE
Leukocytes, UA: NEGATIVE
NITRITE UA: NEGATIVE
PH UA: 5.5
Protein Ur, POC: NEGATIVE
RBC UA: NEGATIVE
SPEC GRAV UA: 1.015
UROBILINOGEN UA: 0.2

## 2015-06-14 LAB — POC MICROSCOPIC URINALYSIS (UMFC): MUCUS RE: ABSENT

## 2015-06-14 LAB — POCT CBC
Granulocyte percent: 67.9 %G (ref 37–80)
HEMATOCRIT: 36.8 % — AB (ref 37.7–47.9)
HEMOGLOBIN: 12.5 g/dL (ref 12.2–16.2)
LYMPH, POC: 1.6 (ref 0.6–3.4)
MCH, POC: 28 pg (ref 27–31.2)
MCHC: 34.1 g/dL (ref 31.8–35.4)
MCV: 82.3 fL (ref 80–97)
MID (cbc): 0.7 (ref 0–0.9)
MPV: 6.1 fL (ref 0–99.8)
POC GRANULOCYTE: 4.7 (ref 2–6.9)
POC LYMPH %: 22.6 % (ref 10–50)
POC MID %: 9.5 %M (ref 0–12)
Platelet Count, POC: 227 10*3/uL (ref 142–424)
RBC: 4.47 M/uL (ref 4.04–5.48)
RDW, POC: 13.9 %
WBC: 6.9 10*3/uL (ref 4.6–10.2)

## 2015-06-14 NOTE — Patient Instructions (Signed)
Drink plenty of fluids  Continue using the Robitussin  If you're passing more blood we will need to do a female exam on you and consider whether you would have to go to a specialist or not  Return at anytime if further problems

## 2015-06-14 NOTE — Progress Notes (Signed)
Patient ID: Emily [name], female    DOB: 04-15-1951  Age: 65 y.o. MRN: [id number]  Chief Complaint  Patient presents with  . Dysuria  . Fatigue  . Cough  . URI    Subjective:   65 year old lady who has been exposed to somebody with a respiratory tract infection in the workplace. She does daycare. She has been having some postnasal drainage and bringing up some mucus, is worried about her sinuses. She's been having chills. She is not blowing a lot of her nose. Does not have ear pain or sore throat. She's not coughing much. Her stomach has been fine. She has had a little bit of blood when she wiped yourself yesterday from the urine, and thought that she might be getting a urinary tract infection. She has not had any dysuria or nocturia. She is scheduled for a colonoscopy next week, and wants to be healthy for that. She's been having the chills, but has not taken her temperature.  Current allergies, medications, problem list, past/family and social histories reviewed.  Objective:  BP 120/74 mmHg  Pulse 102  Temp(Src) 98.6 F (37 C) (Oral)  Resp 17  Ht 5\' 3"  (1.6 m)  Wt 119 lb (53.978 kg)  BMI 21.09 kg/m2  SpO2 95%  No major acute distress. Her TMs are normal. Throat clear. Neck supple without nodes or thyromegaly. No sinus tenderness. Throat looks clear supple without significant nodes. Chest is clear to auscultation. Heart regular without murmurs. No CVA tenderness. No suprapubic tenderness.  Assessment & Plan:   Assessment: 1. Hematuria   2. Purulent postnasal drainage       Plan: We'll check a urinalysis and CBC  Results for orders placed or performed in visit on 06/14/15  POCT Microscopic Urinalysis ([facility])  Result Value Ref Range   WBC,UR,HPF,POC None None WBC/hpf   RBC,UR,HPF,POC None None RBC/hpf   Bacteria None None, Too numerous to count   Mucus Absent Absent   Epithelial Cells, UR Per Microscopy Few (A) None, Too numerous to count cells/hpf  POCT urinalysis  dipstick  Result Value Ref Range   Color, UA yellow yellow   Clarity, UA cloudy (A) clear   Glucose, UA negative negative   Bilirubin, UA negative negative   Ketones, POC UA trace (5) (A) negative   Spec Grav, UA 1.015    Blood, UA negative negative   pH, UA 5.5    Protein Ur, POC negative negative   Urobilinogen, UA 0.2    Nitrite, UA Negative Negative   Leukocytes, UA Negative Negative  POCT CBC  Result Value Ref Range   WBC 6.9 4.6 - 10.2 K/uL   Lymph, poc 1.6 0.6 - 3.4   POC LYMPH PERCENT 22.6 10 - 50 %L   MID (cbc) 0.7 0 - 0.9   POC MID % 9.5 0 - 12 %M   POC Granulocyte 4.7 2 - 6.9   Granulocyte percent 67.9 37 - 80 %G   RBC 4.47 4.04 - 5.48 M/uL   Hemoglobin 12.5 12.2 - 16.2 g/dL   HCT, POC 36.8 (A) 37.7 - 47.9 %   MCV 82.3 80 - 97 fL   MCH, POC 28.0 27 - 31.2 pg   MCHC 34.1 31.8 - 35.4 g/dL   RDW, POC 13.9 %   Platelet Count, POC 227 142 - 424 K/uL   MPV 6.1 0 - 99.8 fL     Orders Placed This Encounter  Procedures  . POCT Microscopic Urinalysis ([facility])  . POCT  urinalysis dipstick  . POCT CBC    No orders of the defined types were placed in this encounter.    If she continues to see any blood when she wiped we'll need to do a pelvic exam AND consider whether she has to go to a urologist. In the meanwhile drink plenty of fluids and reassurance that this should just past.     Patient Instructions  Drink plenty of fluids  Continue using the Robitussin  If you're passing more blood we will need to do a female exam on you and consider whether you would have to go to a specialist or not  Return at anytime if further problems     Return if symptoms worsen or fail to improve.   Emily Forstrom, MD 06/14/2015

## 2015-06-20 ENCOUNTER — Ambulatory Visit (HOSPITAL_COMMUNITY): Admission: RE | Admit: 2015-06-20 | Payer: Medicare Other | Source: Ambulatory Visit | Admitting: Gastroenterology

## 2015-06-20 HISTORY — DX: Other injury of unspecified body region, initial encounter: T14.8XXA

## 2015-06-20 SURGERY — COLONOSCOPY WITH PROPOFOL
Anesthesia: Monitor Anesthesia Care

## 2015-06-24 ENCOUNTER — Telehealth: Payer: Self-pay | Admitting: Family Medicine

## 2015-06-24 NOTE — Telephone Encounter (Signed)
lmom to call and reschedule her appt with Brigitte Pulse on 07-28-15

## 2015-07-28 ENCOUNTER — Ambulatory Visit: Payer: Medicare Other | Admitting: Family Medicine

## 2015-08-11 ENCOUNTER — Ambulatory Visit: Payer: Medicare Other | Admitting: Family Medicine

## 2015-08-17 NOTE — Progress Notes (Signed)
Subjective:    Patient ID: Emily [name], female    DOB: 1951-03-25, 65 y.o.   MRN: [id number] Chief Complaint  Patient presents with  . Follow-up  . Hyperglycemia    HPI  Emily [name] is a delightful 65 yo woman who is here today for a review of her chronic medical problems.  I last saw pt 6 mos prior.  HPL:  Working on tlc.  Has h/o CP due to anxiety prior.  ASCVD risk was 4% so no meds needed.  Pre-DM: 5.8 -> 5.9, nml urinary microalb 6 mos prior, has not been watching diet as closely as previously.  Get zostavax - she has not gotten it  Pt cancelled her colonoscopy as she was to busy she didn't do the clean-out properly so she is going to get rescheduled for Easter weekend but might reschedule it so she can go visit family that weekend.  She thinks she is getting bug bites from her couch.   Is taking 1000u vitamin D as well as calcium and iron supplement  She is fasting today.  Past Medical History  Diagnosis Date  . Anxiety   . Cataract   . Bronchitis   . History of chicken pox   . Tuberculosis     exposed as a young child  . History of uterine fibroid   . Anemia     iron runs low  . Nontraumatic tear of skin     right side of vagina skin since 06-12-15, small amount of blood when wipes   Past Surgical History  Procedure Laterality Date  . Eye surgery Bilateral     lens replacements  . Fracture surgery Right 2003    fibula  . Surgery for fibroids  2000   Current Outpatient Prescriptions on File Prior to Visit  Medication Sig Dispense Refill  . Calcium Citrate-Vitamin D (CALCIUM CITRATE + D PO) Take 1 tablet by mouth every morning.    . Emollient (AQUAPHOR ADVANCED THERAPY EX) Apply 1 application topically 2 (two) times daily.    . Multiple Vitamins-Minerals (EYE VITAMINS) CAPS Take 1 capsule by mouth daily. For macular degenaration     No current facility-administered medications on file prior to visit.   Allergies  Allergen Reactions  . Latex Itching and  Rash   Family History  Problem Relation Age of Onset  . Diabetes Brother   . Hypertension Brother   . Alcohol abuse Brother   . Mental illness Brother     nervous breakdown  . Stroke Maternal Grandmother   . Depression Paternal Grandmother   . Arthritis Mother   . COPD Father 25    decsd due to lack of oxygen  . Hypertension Father   . Alcohol abuse Father   . Gout Father   . Alcohol abuse Brother   . Hypertension Brother    Social History   Social History  . Marital Status: Divorced    Spouse Name: N/A  . Number of Children: N/A  . Years of Education: N/A   Social History Main Topics  . Smoking status: Never Smoker   . Smokeless tobacco: Never Used  . Alcohol Use: No  . Drug Use: No  . Sexual Activity: No   Other Topics Concern  . None   Social History Narrative      Review of Systems  Constitutional: Positive for appetite change. Negative for fever, chills, diaphoresis, activity change, fatigue and unexpected weight change.  Eyes: Negative for visual disturbance.  Respiratory: Negative for cough and shortness of breath.   Cardiovascular: Negative for chest pain, palpitations and leg swelling.  Genitourinary: Negative for decreased urine volume.  Skin: Positive for rash.  Neurological: Negative for syncope and headaches.  Hematological: Does not bruise/bleed easily.       Objective:  BP 103/63 mmHg  Pulse 79  Temp(Src) 98 F (36.7 C)  Resp 16  Ht 5\' 2"  (1.575 m)  Wt 118 lb (53.524 kg)  BMI 21.58 kg/m2  Physical Exam  Constitutional: She is oriented to person, place, and time. She appears well-developed and well-nourished. No distress.  HENT:  Head: Normocephalic and atraumatic.  Right Ear: External ear normal.  Left Ear: External ear normal.  Eyes: Conjunctivae are normal. No scleral icterus.  Neck: Normal range of motion. Neck supple. No thyromegaly present.  Cardiovascular: Normal rate, regular rhythm, normal heart sounds and intact distal  pulses.   Pulmonary/Chest: Effort normal and breath sounds normal. No respiratory distress.  Musculoskeletal: She exhibits no edema.  Lymphadenopathy:    She has no cervical adenopathy.  Neurological: She is alert and oriented to person, place, and time.  Skin: Skin is warm and dry. She is not diaphoretic. No erythema.  Psychiatric: She has a normal mood and affect. Her behavior is normal.          Assessment & Plan:   1. Cellulitis of left upper extremity -outlined with skin script marker - suspect cellulitis secondary from excoriations from rash, double cover w/ bactrim and keflex due to immunosuppressed with DM and rapid onset  2. Prediabetes - worsening, pt has not been watching diet but will focus  3. Hyperlipidemia   4. Glucose intolerance (impaired glucose tolerance)   5. Bug bite - suspect flea, check for bed bugs    Orders Placed This Encounter  Procedures  . Lipid panel    Order Specific Question:  Has the patient fasted?    Answer:  Yes  . CBC with Differential/Platelet  . Sedimentation Rate  . POCT glycosylated hemoglobin (Hb A1C)    Meds ordered this encounter  Medications  . zoster vaccine live, PF, (ZOSTAVAX) [address] UNT/0.65ML injection    Sig: Inject 19,400 Units into the skin once.    Dispense:  1 each    Refill:  0  . cephALEXin (KEFLEX) 500 MG capsule    Sig: Take 1 capsule (500 mg total) by mouth 4 (four) times daily.    Dispense:  28 capsule    Refill:  0  . sulfamethoxazole-trimethoprim (BACTRIM DS,SEPTRA DS) 800-160 MG tablet    Sig: Take 1 tablet by mouth 2 (two) times daily.    Dispense:  14 tablet    Refill:  0     Delman Cheadle, MD MPH

## 2015-08-18 ENCOUNTER — Encounter: Payer: Self-pay | Admitting: Family Medicine

## 2015-08-18 ENCOUNTER — Ambulatory Visit (INDEPENDENT_AMBULATORY_CARE_PROVIDER_SITE_OTHER): Payer: Medicare Other | Admitting: Family Medicine

## 2015-08-18 VITALS — BP 103/63 | HR 79 | Temp 98.0°F | Resp 16 | Ht 62.0 in | Wt 118.0 lb

## 2015-08-18 DIAGNOSIS — W57XXXA Bitten or stung by nonvenomous insect and other nonvenomous arthropods, initial encounter: Secondary | ICD-10-CM

## 2015-08-18 DIAGNOSIS — R7309 Other abnormal glucose: Secondary | ICD-10-CM | POA: Diagnosis not present

## 2015-08-18 DIAGNOSIS — R7302 Impaired glucose tolerance (oral): Secondary | ICD-10-CM

## 2015-08-18 DIAGNOSIS — E785 Hyperlipidemia, unspecified: Secondary | ICD-10-CM | POA: Diagnosis not present

## 2015-08-18 DIAGNOSIS — L03114 Cellulitis of left upper limb: Secondary | ICD-10-CM | POA: Diagnosis not present

## 2015-08-18 DIAGNOSIS — R7303 Prediabetes: Secondary | ICD-10-CM | POA: Diagnosis not present

## 2015-08-18 LAB — CBC WITH DIFFERENTIAL/PLATELET
BASOS PCT: 0 %
Basophils Absolute: 0 cells/uL (ref 0–200)
EOS PCT: 2 %
Eosinophils Absolute: 88 cells/uL (ref 15–500)
HCT: 36.8 % (ref 35.0–45.0)
Hemoglobin: 11.7 g/dL (ref 11.7–15.5)
Lymphocytes Relative: 34 %
Lymphs Abs: 1496 cells/uL (ref 850–3900)
MCH: 27.3 pg (ref 27.0–33.0)
MCHC: 31.8 g/dL — ABNORMAL LOW (ref 32.0–36.0)
MCV: 85.8 fL (ref 80.0–100.0)
MONOS PCT: 11 %
MPV: 8.4 fL (ref 7.5–12.5)
Monocytes Absolute: 484 cells/uL (ref 200–950)
NEUTROS ABS: 2332 {cells}/uL (ref 1500–7800)
Neutrophils Relative %: 53 %
PLATELETS: 232 10*3/uL (ref 140–400)
RBC: 4.29 MIL/uL (ref 3.80–5.10)
RDW: 13.9 % (ref 11.0–15.0)
WBC: 4.4 10*3/uL (ref 3.8–10.8)

## 2015-08-18 LAB — SEDIMENTATION RATE: SED RATE: 13 mm/h (ref 0–30)

## 2015-08-18 LAB — POCT GLYCOSYLATED HEMOGLOBIN (HGB A1C): HEMOGLOBIN A1C: 6.1

## 2015-08-18 LAB — LIPID PANEL
Cholesterol: 194 mg/dL (ref 125–200)
HDL: 92 mg/dL (ref >=46)
LDL CALC: 94 mg/dL (ref <130)
Total CHOL/HDL Ratio: 2.1 Ratio (ref <=5.0)
Triglycerides: 42 mg/dL (ref <150)
VLDL: 8 mg/dL (ref <30)

## 2015-08-18 MED ORDER — SULFAMETHOXAZOLE-TRIMETHOPRIM 800-160 MG PO TABS: 1.0000 | ORAL_TABLET | Freq: Two times a day (BID) | ORAL | Status: DC

## 2015-08-18 MED ORDER — CEPHALEXIN 500 MG PO CAPS: 500.0000 mg | ORAL_CAPSULE | Freq: Four times a day (QID) | ORAL | Status: DC

## 2015-08-18 MED ORDER — ZOSTER VACCINE LIVE 19400 UNT/0.65ML ~~LOC~~ SOLR: 0.6500 mL | Freq: Once | SUBCUTANEOUS | Status: DC

## 2015-08-18 NOTE — Patient Instructions (Addendum)
IF you received an x-ray today, you will receive an invoice from [facility]. Please contact [facility] at [phone] with questions or concerns regarding your invoice.   IF you received labwork today, you will receive an invoice from [employer]. Please contact [facility] at [phone] with questions or concerns regarding your invoice.   Our billing staff will not be able to assist you with questions regarding bills from these companies.  You will be contacted with the lab results as soon as they are available. The fastest way to get your results is to activate your My Chart account. Instructions are located on the last page of this paperwork. If you have not heard from [place] regarding the results in 2 weeks, please contact this office.     Insect Bite Mosquitoes, flies, fleas, bedbugs, and many other insects can bite. Insect bites are different from insect stings. A sting is when poison (venom) is injected into the skin. Insect bites can cause pain or itching for a few days, but they are usually not serious. Some insects can spread diseases to people through a bite. SYMPTOMS  Symptoms of an insect bite include:  Itching or pain in the bite area.  Redness and swelling in the bite area.  An open wound (skin ulcer). In many cases, symptoms last for 2-4 days.  DIAGNOSIS  This condition is usually diagnosed based on symptoms and a physical exam. TREATMENT  Treatment is usually not needed for an insect bite. Symptoms often go away on their own. Your health care provider may recommend creams or lotions to help reduce itching. Antibiotic medicines may be prescribed if the bite becomes infected. A tetanus shot may be given in some cases. If you develop an allergic reaction to an insect bite, your health care provider will prescribe medicines to treat the reaction (antihistamines). This is rare. HOME CARE INSTRUCTIONS  Do not scratch the bite  area.  Keep the bite area clean and dry. Wash the bite area daily with soap and water as told by your health care provider.  If directed, applyice to the bite area.  Put ice in a plastic bag.  Place a towel between your skin and the bag.  Leave the ice on for 20 minutes, 2-3 times per day.  To help reduce itching and swelling, try applying a baking soda paste, cortisone cream, or calamine lotion to the bite area as told by your health care provider.  Apply or take over-the-counter and prescription medicines only as told by your health care provider.  If you were prescribed an antibiotic medicine, use it as told by your health care provider. Do not stop using the antibiotic even if your condition improves.  Keep all follow-up visits as told by your health care provider. This is important. PREVENTION   Use insect repellent. The best insect repellents contain:  DEET, picaridin, oil of lemon eucalyptus (OLE), or IR3535.  Higher amounts of an active ingredient.  When you are outdoors, wear clothing that covers your arms and legs.  Avoid opening windows that do not have window screens. SEEK MEDICAL CARE IF:  You have increased redness, swelling, or pain in the bite area.  You have a fever. SEEK IMMEDIATE MEDICAL CARE IF:   You have joint pain.   You have fluid, blood, or pus coming from the bite area.  You have a headache or neck pain.  You have unusual weakness.  You have a rash.  You have  chest pain or shortness of breath.  You have abdominal pain, nausea, or vomiting.  You feel unusually tired or sleepy.   This information is not intended to replace advice given to you by your health care provider. Make sure you discuss any questions you have with your health care provider.   Document Released: 06/07/2004 Document Revised: 01/19/2015 Document Reviewed: 09/15/2014 Elsevier Interactive Patient Education 2016 Elsevier Inc.  Cellulitis Cellulitis is an infection  of the skin and the tissue beneath it. The infected area is usually red and tender. Cellulitis occurs most often in the arms and lower legs.  CAUSES  Cellulitis is caused by bacteria that enter the skin through cracks or cuts in the skin. The most common types of bacteria that cause cellulitis are staphylococci and streptococci. SIGNS AND SYMPTOMS   Redness and warmth.  Swelling.  Tenderness or pain.  Fever. DIAGNOSIS  Your health care provider can usually determine what is wrong based on a physical exam. Blood tests may also be done. TREATMENT  Treatment usually involves taking an antibiotic medicine. HOME CARE INSTRUCTIONS   Take your antibiotic medicine as directed by your health care provider. Finish the antibiotic even if you start to feel better.  Keep the infected arm or leg elevated to reduce swelling.  Apply a warm cloth to the affected area up to 4 times per day to relieve pain.  Take medicines only as directed by your health care provider.  Keep all follow-up visits as directed by your health care provider. SEEK MEDICAL CARE IF:   You notice red streaks coming from the infected area.  Your red area gets larger or turns dark in color.  Your bone or joint underneath the infected area becomes painful after the skin has healed.  Your infection returns in the same area or another area.  You notice a swollen bump in the infected area.  You develop new symptoms.  You have a fever. SEEK IMMEDIATE MEDICAL CARE IF:   You feel very sleepy.  You develop vomiting or diarrhea.  You have a general ill feeling (malaise) with muscle aches and pains.   This information is not intended to replace advice given to you by your health care provider. Make sure you discuss any questions you have with your health care provider.   Document Released: 02/07/2005 Document Revised: 01/19/2015 Document Reviewed: 07/16/2011 Elsevier Interactive Patient Education [employer].

## 2015-08-19 ENCOUNTER — Encounter: Payer: Self-pay | Admitting: Family Medicine

## 2015-09-01 ENCOUNTER — Telehealth: Payer: Self-pay | Admitting: Family Medicine

## 2015-09-01 NOTE — Telephone Encounter (Signed)
LEFT A MESSAGE FOR PATIENT TO RETURN CALL.   HER APPOINTMENT IN October WITH DR SHAW SHOULD HAVE BEEN SCHEDULED AS A CPE AND NOT AN OV.  PLEASE RESCHEDULE AS A CPE,.

## 2015-10-26 ENCOUNTER — Ambulatory Visit (INDEPENDENT_AMBULATORY_CARE_PROVIDER_SITE_OTHER): Payer: Medicare Other | Admitting: Family Medicine

## 2015-10-26 VITALS — BP 110/64 | HR 100 | Temp 97.9°F | Resp 18 | Ht 62.0 in | Wt 120.0 lb

## 2015-10-26 DIAGNOSIS — S30811A Abrasion of abdominal wall, initial encounter: Secondary | ICD-10-CM

## 2015-10-26 DIAGNOSIS — R21 Rash and other nonspecific skin eruption: Secondary | ICD-10-CM | POA: Diagnosis not present

## 2015-10-26 LAB — POCT SKIN KOH: Skin KOH, POC: NEGATIVE

## 2015-10-26 LAB — POCT URINALYSIS DIP (MANUAL ENTRY)
BILIRUBIN UA: NEGATIVE
Bilirubin, UA: NEGATIVE
Glucose, UA: NEGATIVE
Leukocytes, UA: NEGATIVE
Nitrite, UA: NEGATIVE
PH UA: 7
PROTEIN UA: NEGATIVE
RBC UA: NEGATIVE
SPEC GRAV UA: 1.01
UROBILINOGEN UA: 0.2

## 2015-10-26 LAB — POC MICROSCOPIC URINALYSIS (UMFC): MUCUS RE: ABSENT

## 2015-10-26 MED ORDER — TRIAMCINOLONE 0.1 % CREAM:EUCERIN CREAM 1:1: 1.0000 "application " | TOPICAL_CREAM | Freq: Two times a day (BID) | CUTANEOUS | Status: DC | PRN

## 2015-10-26 MED ORDER — HYDROXYZINE HCL 25 MG PO TABS: 25.0000 mg | ORAL_TABLET | Freq: Three times a day (TID) | ORAL | Status: DC | PRN

## 2015-10-26 NOTE — Progress Notes (Signed)
By signing my name below, I, Mesha Guinyard, attest that this documentation has been prepared under the direction and in the presence of Delman Cheadle, MD.  Electronically Signed: Verlee Monte, Medical Scribe. 10/26/2015. 10:59 AM.  Subjective:    Patient ID: Emily [name], female    DOB: 01-31-1951, 65 y.o.   MRN: [id number]  HPI Chief Complaint  Patient presents with  . Rash    located on abdomen, private area and thighs x 3 weeks     HPI Comments: Emily [name] is a 65 y.o. female who presents to the [facility] complaining of irritation and itching on the side of her groin onset 3 weeks ago. Pt had red bumps that radiated from the right side to the left side 3 days ago. She moved to a different room and was fine afterwards. She went back to the same room she got the irritation and became irritated again yesterday. Pt isn't sure if its the mattress or bed frame. Pt states the bed frame is 67 years old and thinks something in the wood is going into the bed frame. Pt has cleaned all of her linnen since the irritation started. Pt has been using the cortisone cream BID with relief to her symptoms.  Pt mentions red bumps on her lower abdomen. Pt mentions they come up on her back when she's stressed.  Patient Active Problem List   Diagnosis Date Noted  . Prediabetes 11/21/2012  . Breast fibrocystic disorder 11/21/2012  . Chest pain 11/21/2012  . Anxiety state, unspecified 11/21/2012  . Incidental lung nodule, > 33mm and < 83mm 02/15/2012   Past Medical History  Diagnosis Date  . Anxiety   . Cataract   . Bronchitis   . History of chicken pox   . Tuberculosis     exposed as a young child  . History of uterine fibroid   . Anemia     iron runs low  . Nontraumatic tear of skin     right side of vagina skin since 06-12-15, small amount of blood when wipes   Past Surgical History  Procedure Laterality Date  . Eye surgery Bilateral     lens replacements  . Fracture  surgery Right 2003    fibula  . Surgery for fibroids  2000   Allergies  Allergen Reactions  . Latex Itching and Rash   Prior to Admission medications   Medication Sig Start Date End Date Taking? Authorizing Provider  Calcium Citrate-Vitamin D (CALCIUM CITRATE + D PO) Take 1 tablet by mouth every morning.   Yes Historical Provider, MD   Social History   Social History  . Marital Status: Divorced    Spouse Name: N/A  . Number of Children: N/A  . Years of Education: N/A   Occupational History  . Not on file.   Social History Main Topics  . Smoking status: Never Smoker   . Smokeless tobacco: Never Used  . Alcohol Use: No  . Drug Use: No  . Sexual Activity: No   Other Topics Concern  . Not on file   Social History Narrative   Depression screen [facility] 2/9 10/26/2015 08/18/2015 06/14/2015 04/10/2015 02/08/2015  Decreased Interest 0 0 0 0 0  Down, Depressed, Hopeless 0 0 0 0 0  PHQ - 2 Score 0 0 0 0 0   Review of Systems  Constitutional: Positive for fatigue. Negative for fever, chills, activity change, appetite change and unexpected weight change.  Genitourinary: Negative for vaginal  discharge, difficulty urinating and vaginal pain.  Musculoskeletal: Positive for back pain and arthralgias. Negative for joint swelling and gait problem.  Skin: Positive for color change, rash and wound. Negative for pallor.  Allergic/Immunologic: Positive for environmental allergies. Negative for immunocompromised state.  Hematological: Negative for adenopathy. Does not bruise/bleed easily.  Psychiatric/Behavioral: Positive for sleep disturbance.   Objective:  BP 110/64 mmHg  Pulse 100  Temp(Src) 97.9 F (36.6 C) (Oral)  Resp 18  Ht 5\' 2"  (1.575 m)  Wt 120 lb (54.432 kg)  BMI 21.94 kg/m2  SpO2 98%  Physical Exam  Constitutional: She appears well-developed and well-nourished. No distress.  HENT:  Head: Normocephalic and atraumatic.  Eyes: Conjunctivae are normal.  Neck: Neck supple.    Cardiovascular: Normal rate.   Pulmonary/Chest: Effort normal.  Genitourinary:  Excoriations and linear abrasions along the perineum and the hair and mons pubis are nl  Neurological: She is alert.  Skin: Skin is warm and dry.  Liner excoriations across lower abdomen bilaterally  Psychiatric: She has a normal mood and affect. Her behavior is normal.  Nursing note and vitals reviewed.  Results for orders placed or performed in visit on 10/26/15  POCT Microscopic Urinalysis ([facility])  Result Value Ref Range   WBC,UR,HPF,POC None None WBC/hpf   RBC,UR,HPF,POC None None RBC/hpf   Bacteria None None, Too numerous to count   Mucus Absent Absent   Epithelial Cells, UR Per Microscopy None None, Too numerous to count cells/hpf  POCT urinalysis dipstick  Result Value Ref Range   Color, UA yellow yellow   Clarity, UA clear clear   Glucose, UA negative negative   Bilirubin, UA negative negative   Ketones, POC UA negative negative   Spec Grav, UA 1.010    Blood, UA negative negative   pH, UA 7.0    Protein Ur, POC negative negative   Urobilinogen, UA 0.2    Nitrite, UA Negative Negative   Leukocytes, UA Negative Negative  POCT Skin KOH  Result Value Ref Range   Skin KOH, POC Negative    Assessment & Plan:   1. Perineal rash in female   2. Excoriation of abdomen, initial encounter     Orders Placed This Encounter  Procedures  . POCT Microscopic Urinalysis ([facility])  . POCT urinalysis dipstick  . POCT Skin KOH    Meds ordered this encounter  Medications  . Triamcinolone Acetonide (TRIAMCINOLONE 0.1 % CREAM : EUCERIN) CREA    Sig: Apply 1 application topically 2 (two) times daily as needed for rash, itching or irritation.    Dispense:  1 each    Refill:  1  . hydrOXYzine (ATARAX/VISTARIL) 25 MG tablet    Sig: Take 1 tablet (25 mg total) by mouth 3 (three) times daily as needed for itching.    Dispense:  30 tablet    Refill:  0    I personally performed the services described  in this documentation, which was scribed in my presence. The recorded information has been reviewed and considered, and addended by me as needed.   Delman Cheadle, M.D.  [facility] [address] [place], [place] [address] [phone] phone [phone] fax  11/11/2015 9:43 AM

## 2015-10-26 NOTE — Patient Instructions (Addendum)
     IF you received an x-ray today, you will receive an invoice from [facility] Radiology. Please contact [facility]iology at [phone] with questions or concerns regarding your invoice.   IF you received labwork today, you will receive an invoice from [employer]. Please contact [facility] at [phone] with questions or concerns regarding your invoice.   Our billing staff will not be able to assist you with questions regarding bills from these companies.  You will be contacted with the lab results as soon as they are available. The fastest way to get your results is to activate your My Chart account. Instructions are located on the last page of this paperwork. If you have not heard from [place] regarding the results in 2 weeks, please contact this office.    Pruritus Pruritus is an itching feeling. There are many different conditions and factors that can make your skin itchy. Dry skin is one of the most common causes of itching. Most cases of itching do not require medical attention. Itchy skin can turn into a rash.  HOME CARE INSTRUCTIONS  Watch your pruritus for any changes. Take these steps to help with your condition:  Skin Care  Moisturize your skin as needed. A moisturizer that contains petroleum jelly is best for keeping moisture in your skin.  Take or apply medicines only as directed by your health care provider. This may include:  Corticosteroid cream.  Anti-itch lotions.  Oral anti-histamines.  Apply cool compresses to the affected areas.  Try taking a bath with:  Epsom salts. Follow the instructions on the packaging. You can get these at your local pharmacy or grocery store.  Baking soda. Pour a small amount into the bath as directed by your health care provider.  Colloidal oatmeal. Follow the instructions on the packaging. You can get this at your local pharmacy or grocery store.  Try applying baking soda paste to your skin. Stir water  into baking soda until it reaches a paste-like consistency.   Do not scratch your skin.  Avoid hot showers or baths, which can make itching worse. A cold shower may help with itching as long as you use a moisturizer after.  Avoid scented soaps, detergents, and perfumes. Use gentle soaps, detergents, perfumes, and other cosmetic products. General Instructions  Avoid wearing tight clothes.  Keep a journal to help track what causes your itch. Write down:  What you eat.  What cosmetic products you use.  What you drink.  What you wear. This includes jewelry.  Use a humidifier. This keeps the air moist, which helps to prevent dry skin. SEEK MEDICAL CARE IF:  The itching does not go away after several days.  You sweat at night.  You have weight loss.  You are unusually thirsty.  You urinate more than normal.  You are more tired than normal.  You have abdominal pain.  Your skin tingles.  You feel weak.  Your skin or the whites of your eyes look yellow (jaundice).  Your skin feels numb.   This information is not intended to replace advice given to you by your health care provider. Make sure you discuss any questions you have with your health care provider.   Document Released: 01/10/2011 Document Revised: 09/14/2014 Document Reviewed: 04/26/2014 Elsevier Interactive Patient Education [employer].

## 2016-02-22 NOTE — Progress Notes (Signed)
Subjective:    Patient ID: Emily [name], female    DOB: Jan 21, 1951, 65 y.o.   MRN: [id number] Chief Complaint  Patient presents with  . Follow-up    diabetes  . Medication Refill    per patient did not get before,but need now    HPI  Emily [name] is a delightful 65 yo woman here for a 6 mo follow-up on her chronic medical conditions.  HPL:   LDL 122-> 94, HDL 99-> 92 when checked 08/2015 when pt was working on tlc.  Has h/o CP due to anxiety prior.  ASCVD risk was 4% so no meds needed.  Has been terrible on her diet.  She has been so fatigued as she has been skipping many meals due to busy.  Pre-DM: 5.8 -> 5.9 ->6.1->5.9 today, nml urinary microalb 01/2015, has not been watching diet as closely as previously.  Has NOT had zostavax. Pt cancelled her colonoscopy as she was to busy and never rescheduled.  She has stopped her vitamin supp of D, Ca, and iron that she was taking before, not getting to much in diet  Has a 63 yo granddaughter she is trying to keep up with and still working at daycare  Depression screen [facility] 2/9 03/08/2016 02/23/2016 10/26/2015 08/18/2015 06/14/2015  Decreased Interest 0 0 0 0 0  Down, Depressed, Hopeless 0 0 0 0 0  PHQ - 2 Score 0 0 0 0 0     Past Medical History:  Diagnosis Date  . Anemia    iron runs low  . Anxiety   . Bronchitis   . Cataract   . History of chicken pox   . History of uterine fibroid   . Nontraumatic tear of skin    right side of vagina skin since 06-12-15, small amount of blood when wipes  . Tuberculosis    exposed as a young child   Past Surgical History:  Procedure Laterality Date  . EYE SURGERY Bilateral    lens replacements  . FRACTURE SURGERY Right 2003   fibula  . surgery for fibroids  2000   Current Outpatient Prescriptions on File Prior to Visit  Medication Sig Dispense Refill  . Calcium Citrate-Vitamin D (CALCIUM CITRATE + D PO) Take 1 tablet by mouth every morning.     No current facility-administered  medications on file prior to visit.    Allergies  Allergen Reactions  . Latex Itching and Rash   Family History  Problem Relation Age of Onset  . Diabetes Brother   . Hypertension Brother   . Alcohol abuse Brother   . Mental illness Brother     nervous breakdown  . Stroke Maternal Grandmother   . Depression Paternal Grandmother   . Arthritis Mother   . COPD Father 62    decsd due to lack of oxygen  . Hypertension Father   . Alcohol abuse Father   . Gout Father   . Alcohol abuse Brother   . Hypertension Brother    Social History   Social History  . Marital status: Divorced    Spouse name: N/A  . Number of children: N/A  . Years of education: N/A   Social History Main Topics  . Smoking status: Never Smoker  . Smokeless tobacco: Never Used  . Alcohol use No  . Drug use: No  . Sexual activity: No   Other Topics Concern  . None   Social History Narrative  . None    Review of Systems  See hpi    Objective:   Physical Exam  Constitutional: She is oriented to person, place, and time. She appears well-developed and well-nourished. No distress.  HENT:  Head: Normocephalic and atraumatic.  Right Ear: External ear normal.  Left Ear: External ear normal.  Eyes: Conjunctivae are normal. No scleral icterus.  Neck: Normal range of motion. Neck supple. No thyromegaly present.  Cardiovascular: Normal rate, regular rhythm, normal heart sounds and intact distal pulses.   Pulmonary/Chest: Effort normal and breath sounds normal. No respiratory distress.  Musculoskeletal: She exhibits no edema.  Lymphadenopathy:    She has no cervical adenopathy.  Neurological: She is alert and oriented to person, place, and time.  Skin: Skin is warm and dry. She is not diaphoretic. No erythema.  Psychiatric: She has a normal mood and affect. Her behavior is normal.      BP 104/64 (BP Location: Left Arm, Patient Position: Sitting, Cuff Size: Normal)   Pulse 84   Temp 98.4 F (36.9 C)  (Oral)   Resp 16   Ht 5' 2.25" (1.581 m)   Wt 114 lb 12.8 oz (52.1 kg)   SpO2 97%   BMI 20.83 kg/m  Results for orders placed or performed in visit on 02/23/16  POCT glycosylated hemoglobin (Hb A1C)  Result Value Ref Range   Hemoglobin A1C 5.9        Assessment & Plan:   sched Initial AWV as had WTM visit 01/2015 1. Prediabetes   2. Chest pain, unspecified type   3. Pruritic dermatitis - start bid hypoallergenic moisturizer bid    Orders Placed This Encounter  Procedures  . Lipid panel    Order Specific Question:   Has the patient fasted?    Answer:   Yes  . Comprehensive metabolic panel    Order Specific Question:   Has the patient fasted?    Answer:   Yes  . Ambulatory referral to Allergy    Referral Priority:   Routine    Referral Type:   Allergy Testing    Referral Reason:   Specialty Services Required    Requested Specialty:   Allergy    Number of Visits Requested:   1  . POCT glycosylated hemoglobin (Hb A1C)    Meds ordered this encounter  Medications  . hydrOXYzine (ATARAX/VISTARIL) 25 MG tablet    Sig: Take 1 tablet (25 mg total) by mouth every 8 (eight) hours as needed for itching.    Dispense:  90 tablet    Refill:  1  . Triamcinolone Acetonide (TRIAMCINOLONE 0.1 % CREAM : EUCERIN) CREA    Sig: Apply 1 application topically 2 (two) times daily as needed for rash, itching or irritation.    Dispense:  1 each    Refill:  1     Delman Cheadle, M.D.  [facility] [address] [place], [place] [address] [phone] phone [phone] fax  03/15/16 9:56 PM

## 2016-02-23 ENCOUNTER — Ambulatory Visit (INDEPENDENT_AMBULATORY_CARE_PROVIDER_SITE_OTHER): Payer: Medicare Other | Admitting: Family Medicine

## 2016-02-23 ENCOUNTER — Encounter: Payer: Self-pay | Admitting: Family Medicine

## 2016-02-23 ENCOUNTER — Telehealth: Payer: Self-pay

## 2016-02-23 VITALS — BP 104/64 | HR 84 | Temp 98.4°F | Resp 16 | Ht 62.25 in | Wt 114.8 lb

## 2016-02-23 DIAGNOSIS — L299 Pruritus, unspecified: Secondary | ICD-10-CM

## 2016-02-23 DIAGNOSIS — R7303 Prediabetes: Secondary | ICD-10-CM

## 2016-02-23 DIAGNOSIS — R079 Chest pain, unspecified: Secondary | ICD-10-CM | POA: Diagnosis not present

## 2016-02-23 LAB — COMPREHENSIVE METABOLIC PANEL
ALT: 17 U/L (ref 6–29)
AST: 20 U/L (ref 10–35)
Albumin: 3.8 g/dL (ref 3.6–5.1)
Alkaline Phosphatase: 46 U/L (ref 33–130)
BUN: 19 mg/dL (ref 7–25)
CHLORIDE: 105 mmol/L (ref 98–110)
CO2: 27 mmol/L (ref 20–31)
CREATININE: 0.61 mg/dL (ref 0.50–0.99)
Calcium: 8.9 mg/dL (ref 8.6–10.4)
Glucose, Bld: 98 mg/dL (ref 65–99)
POTASSIUM: 3.7 mmol/L (ref 3.5–5.3)
SODIUM: 140 mmol/L (ref 135–146)
TOTAL PROTEIN: 6.8 g/dL (ref 6.1–8.1)
Total Bilirubin: 0.4 mg/dL (ref 0.2–1.2)

## 2016-02-23 LAB — LIPID PANEL
Cholesterol: 199 mg/dL (ref 125–200)
HDL: 97 mg/dL (ref >=46)
LDL Cholesterol: 94 mg/dL (ref <130)
Total CHOL/HDL Ratio: 2.1 Ratio (ref <=5.0)
Triglycerides: 41 mg/dL (ref <150)
VLDL: 8 mg/dL (ref <30)

## 2016-02-23 LAB — POCT GLYCOSYLATED HEMOGLOBIN (HGB A1C): Hemoglobin A1C: 5.9

## 2016-02-23 MED ORDER — HYDROXYZINE HCL 25 MG PO TABS: 25.0000 mg | ORAL_TABLET | Freq: Three times a day (TID) | ORAL | 1 refills | Status: DC | PRN

## 2016-02-23 MED ORDER — TRIAMCINOLONE 0.1 % CREAM:EUCERIN CREAM 1:1: 1.0000 "application " | TOPICAL_CREAM | Freq: Two times a day (BID) | CUTANEOUS | 1 refills | Status: DC | PRN

## 2016-02-23 NOTE — Telephone Encounter (Signed)
Pa for hydroxyzine started today.

## 2016-02-23 NOTE — Patient Instructions (Addendum)
Make sure you are using a good hypoallergenic thick moisturizer cream such as Eucerin, Cedaphil, or Aquaphor and apply this continually twice a day - especially immediately after showering to prevent it from coming back.    IF you received an x-ray today, you will receive an invoice from [facility] Radiology. Please contact [facility] Radiology at [phone] with questions or concerns regarding your invoice.   IF you received labwork today, you will receive an invoice from [employer]. Please contact [facility] at [phone] with questions or concerns regarding your invoice.   Our billing staff will not be able to assist you with questions regarding bills from these companies.  You will be contacted with the lab results as soon as they are available. The fastest way to get your results is to activate your My Chart account. Instructions are located on the last page of this paperwork. If you have not heard from [place] regarding the results in 2 weeks, please contact this office.    Fatigue Fatigue is feeling tired all of the time, a lack of energy, or a lack of motivation. Occasional or mild fatigue is often a normal response to activity or life in general. However, long-lasting (chronic) or extreme fatigue may indicate an underlying medical condition. HOME CARE INSTRUCTIONS  Watch your fatigue for any changes. The following actions may help to lessen any discomfort you are feeling:  Talk to your health care provider about how much sleep you need each night. Try to get the required amount every night.  Take medicines only as directed by your health care provider.  Eat a healthy and nutritious diet. Ask your health care provider if you need help changing your diet.  Drink enough fluid to keep your urine clear or pale yellow.  Practice ways of relaxing, such as yoga, meditation, massage therapy, or acupuncture.  Exercise regularly.   Change situations that cause you  stress. Try to keep your work and personal routine reasonable.  Do not abuse illegal drugs.  Limit alcohol intake to no more than 1 drink per day for nonpregnant women and 2 drinks per day for men. One drink equals 12 ounces of beer, 5 ounces of wine, or 1 ounces of hard liquor.  Take a multivitamin, if directed by your health care provider. SEEK MEDICAL CARE IF:   Your fatigue does not get better.  You have a fever.   You have unintentional weight loss or gain.  You have headaches.   You have difficulty:   Falling asleep.  Sleeping throughout the night.  You feel angry, guilty, anxious, or sad.   You are unable to have a bowel movement (constipation).   You skin is dry.   Your legs or another part of your body is swollen.  SEEK IMMEDIATE MEDICAL CARE IF:   You feel confused.   Your vision is blurry.  You feel faint or pass out.   You have a severe headache.   You have severe abdominal, pelvic, or back pain.   You have chest pain, shortness of breath, or an irregular or fast heartbeat.   You are unable to urinate or you urinate less than normal.   You develop abnormal bleeding, such as bleeding from the rectum, vagina, nose, lungs, or nipples.  You vomit blood.   You have thoughts about harming yourself or committing suicide.   You are worried that you might harm someone else.    This information is not intended to replace advice given to  you by your health care provider. Make sure you discuss any questions you have with your health care provider.   Document Released: 02/25/2007 Document Revised: 05/21/2014 Document Reviewed: 09/01/2013 Elsevier Interactive Patient Education [employer].

## 2016-02-24 ENCOUNTER — Encounter: Payer: Self-pay | Admitting: Family Medicine

## 2016-02-25 NOTE — Telephone Encounter (Signed)
Pa approved 05/12/16-05/13/2016 Pharmacy advised.

## 2016-02-28 NOTE — Telephone Encounter (Signed)
I received a notice from [employer] stating that coverage is approved also from period of 05/12/16 - 05/13/2017.

## 2016-03-07 ENCOUNTER — Other Ambulatory Visit: Payer: Self-pay | Admitting: Family Medicine

## 2016-03-07 ENCOUNTER — Telehealth: Payer: Self-pay

## 2016-03-07 DIAGNOSIS — Z1231 Encounter for screening mammogram for malignant neoplasm of breast: Secondary | ICD-10-CM

## 2016-03-07 NOTE — Telephone Encounter (Signed)
Pt is needing to talk with dr Brigitte Pulse about setting up a mammorgram for her left breast is having pain and sensations of leakage   Best number [phone]

## 2016-03-07 NOTE — Telephone Encounter (Signed)
PATIENT STATES DR. SHAW SAW HER A FEW WEEKS AGO FOR A POSSIBLE BUG BITE ON HER (L) ARM. SHE SAID THE SPOT STILL ITCHES, TURNS RED AND THEN GOES AWAY AGAIN. SHE WANTS TO KNOW IF DR. SHAW THINKS SHE HAS SOMETHING THAT COULD GO INTO HER BLOOD STREAM? SHE ALSO WOULD LIKE DR. SHAW TO ORDER HER TO HAVE A "STOOL TEST." SHE SAID HER SISTER SUGGESTED SHE GET ONE AND TOLD HER YOU HAVE TO ASK THE DOCTOR TO HAVE IT DONE. BEST PHONE [phone] (CELL)  [facility] ([place])  [place]

## 2016-03-07 NOTE — Telephone Encounter (Signed)
There are numerous different stool tests done for different things. We did not do any of these as pt has not had any GI symptoms of concern.  Some people choose to do stool tests every year for colon cancer screening rather than go for a colonoscopy but pt has elected the latter.  She was referred to Dr. Earle Gell, [facility], last year for repeat colonoscopy.  Did she go? Recommend pt schedule her Initial Medicare Annual Wellness Visit (her welcome to medicare visit was 01/27/15 so ok to sched AWV at any time.) We can discuss these testing options in more detail then.  She should come back in to have the rash checked when it is there and stop the triamcinolone lotion I had rx'ed her if it is not working.

## 2016-03-07 NOTE — Telephone Encounter (Signed)
Attempted to call pt. No answer. Voice mail left to call back for an appointment.

## 2016-03-07 NOTE — Telephone Encounter (Signed)
She has to have an appointment with me for this as it is a special kind of mammogram and [place] and they have to have the physician notes documenting where the abnormality is and what we think it might be. She should come in for this immed - please have her sched an appt with me this wk - I could see her for this at [facility] tomorrow.

## 2016-03-08 ENCOUNTER — Ambulatory Visit (INDEPENDENT_AMBULATORY_CARE_PROVIDER_SITE_OTHER): Payer: Medicare Other | Admitting: Family Medicine

## 2016-03-08 ENCOUNTER — Encounter: Payer: Self-pay | Admitting: Family Medicine

## 2016-03-08 ENCOUNTER — Telehealth: Payer: Self-pay | Admitting: Emergency Medicine

## 2016-03-08 VITALS — BP 104/68 | HR 87 | Temp 98.4°F | Resp 18 | Ht 62.25 in | Wt 113.8 lb

## 2016-03-08 DIAGNOSIS — B354 Tinea corporis: Secondary | ICD-10-CM | POA: Diagnosis not present

## 2016-03-08 DIAGNOSIS — Z1231 Encounter for screening mammogram for malignant neoplasm of breast: Secondary | ICD-10-CM

## 2016-03-08 DIAGNOSIS — R21 Rash and other nonspecific skin eruption: Secondary | ICD-10-CM

## 2016-03-08 DIAGNOSIS — N6311 Unspecified lump in the right breast, upper outer quadrant: Secondary | ICD-10-CM

## 2016-03-08 DIAGNOSIS — N63 Unspecified lump in unspecified breast: Secondary | ICD-10-CM

## 2016-03-08 DIAGNOSIS — Z1239 Encounter for other screening for malignant neoplasm of breast: Secondary | ICD-10-CM

## 2016-03-08 DIAGNOSIS — N6321 Unspecified lump in the left breast, upper outer quadrant: Secondary | ICD-10-CM | POA: Diagnosis not present

## 2016-03-08 LAB — POCT SKIN KOH: SKIN KOH, POC: NEGATIVE

## 2016-03-08 MED ORDER — ZOSTER VACCINE LIVE 19400 UNT/0.65ML ~~LOC~~ SUSR: 0.6500 mL | Freq: Once | SUBCUTANEOUS | 0 refills | Status: AC

## 2016-03-08 MED ORDER — CLOTRIMAZOLE-BETAMETHASONE 1-0.05 % EX CREA: 1.0000 "application " | TOPICAL_CREAM | Freq: Two times a day (BID) | CUTANEOUS | 0 refills | Status: DC

## 2016-03-08 NOTE — Progress Notes (Signed)
Subjective:    Patient ID: Emily [name], female    DOB: 10/04/1950, 65 y.o.   MRN: [id number] Chief Complaint  Patient presents with  . Insect Bite    LEFT ARM  . DISCUSS MAMMO AND STOOL TEST    HPI  She has had the sensation of breast leaking while hasn't actually seen any discharge for over a year.  NO skin changes. Normal mam in 10/2014  Her work hours have been decrased from 2 -6 pm so only 4 hours/d.   Using aquaphor or vaseline 2-3x/wk but skin stays very itcing. WTM done 1 yr prior  She called Dr. Durenda Age office yesterday and have rescheduled.  Lesion on arm is still itchy  Depression screen [facility] 2/9 03/08/2016 02/23/2016 10/26/2015 08/18/2015 06/14/2015  Decreased Interest 0 0 0 0 0  Down, Depressed, Hopeless 0 0 0 0 0  PHQ - 2 Score 0 0 0 0 0   Past Medical History:  Diagnosis Date  . Anemia    iron runs low  . Anxiety   . Bronchitis   . Cataract   . History of chicken pox   . History of uterine fibroid   . Nontraumatic tear of skin    right side of vagina skin since 06-12-15, small amount of blood when wipes  . Tuberculosis    exposed as a young child   Past Surgical History:  Procedure Laterality Date  . EYE SURGERY Bilateral    lens replacements  . FRACTURE SURGERY Right 2003   fibula  . surgery for fibroids  2000   Current Outpatient Prescriptions on File Prior to Visit  Medication Sig Dispense Refill  . Calcium Citrate-Vitamin D (CALCIUM CITRATE + D PO) Take 1 tablet by mouth every morning.    . hydrOXYzine (ATARAX/VISTARIL) 25 MG tablet Take 1 tablet (25 mg total) by mouth every 8 (eight) hours as needed for itching. 90 tablet 1   No current facility-administered medications on file prior to visit.    Allergies  Allergen Reactions  . Latex Itching and Rash   Family History  Problem Relation Age of Onset  . Diabetes Brother   . Hypertension Brother   . Alcohol abuse Brother   . Mental illness Brother     nervous breakdown  . Stroke  Maternal Grandmother   . Depression Paternal Grandmother   . Arthritis Mother   . COPD Father 51    decsd due to lack of oxygen  . Hypertension Father   . Alcohol abuse Father   . Gout Father   . Alcohol abuse Brother   . Hypertension Brother    Social History   Social History  . Marital status: Divorced    Spouse name: N/A  . Number of children: N/A  . Years of education: N/A   Social History Main Topics  . Smoking status: Never Smoker  . Smokeless tobacco: Never Used  . Alcohol use No  . Drug use: No  . Sexual activity: No   Other Topics Concern  . None   Social History Narrative  . None    Review of Systems  Constitutional: Positive for fatigue and unexpected weight change. Negative for activity change, appetite change, chills, diaphoresis and fever.  Cardiovascular: Negative for chest pain, palpitations and leg swelling.  Skin: Positive for rash. Negative for color change, pallor and wound.  Hematological: Negative for adenopathy. Does not bruise/bleed easily.  Psychiatric/Behavioral: Negative for dysphoric mood. The patient is not nervous/anxious.  Objective:   Physical Exam  Constitutional: She is oriented to person, place, and time. She appears well-developed and well-nourished. No distress.  HENT:  Head: Normocephalic and atraumatic.  Right Ear: External ear normal.  Eyes: Conjunctivae are normal. No scleral icterus.  Pulmonary/Chest: Effort normal.  Right breast with marble-sized nodule at 10-11 oclock immed outside of area. Left breast with marble-sized nodule at 1-2 oclock immed outside of areola So both are in the upper outer quandrant  Lymphadenopathy:    She has no axillary adenopathy.       Right axillary: No pectoral and no lateral adenopathy present.       Left axillary: No pectoral and no lateral adenopathy present. Neurological: She is alert and oriented to person, place, and time.  Skin: Skin is warm and dry. She is not diaphoretic.  No erythema.  Mild left forearm with very mild hyperpigmentation of oval serpiginous shape with central clearing, approx 2x3 cm  Psychiatric: She has a normal mood and affect. Her behavior is normal.     Results for orders placed or performed in visit on 03/08/16  POCT Skin KOH  Result Value Ref Range   Skin KOH, POC Negative        BP 104/68 (BP Location: Right Arm, Patient Position: Sitting, Cuff Size: Small)   Pulse 87   Temp 98.4 F (36.9 C) (Oral)   Resp 18   Ht 5' 2.25" (1.581 m)   Wt 113 lb 12.8 oz (51.6 kg)   SpO2 98%   BMI 20.65 kg/m   Assessment & Plan:   1. Rash and nonspecific skin eruption - suspect ring worm, try otc antifungal  2. Tinea corporis   3. Breast mass in female   4. Special screening examination for neoplasm of breast   5. Mass of upper outer quadrant of left breast   6. Mass of upper outer quadrant of right breast    Plans to sched her appt with [facility] for colonoscopy  Orders Placed This Encounter  Procedures  . US BREAST LTD UNI RIGHT INC AXILLA    Standing Status:   Future    Standing Expiration Date:   05/08/2017    Order Specific Question:   Reason for Exam (SYMPTOM  OR DIAGNOSIS REQUIRED)    Answer:   breast mass 10-11 oclock immed outside of areola - approx 1 cm/marble size    Order Specific Question:   Preferred imaging location?    Answer:   [facility]  . US BREAST LTD UNI LEFT INC AXILLA    Standing Status:   Future    Standing Expiration Date:   05/08/2017    Order Specific Question:   Reason for Exam (SYMPTOM  OR DIAGNOSIS REQUIRED)    Answer:   breast mass 1-2 oclock immed outside of areola - approx 1 cm/marble size    Order Specific Question:   Preferred imaging location?    Answer:   [facility]  . MM DIAG BREAST TOMO BILATERAL    Standing Status:   Future    Standing Expiration Date:   05/08/2017    Order Specific Question:   Reason for Exam (SYMPTOM  OR DIAGNOSIS REQUIRED)    Answer:   breast mass 10-11  oclock immed outside of areola - approx 1 cm/marble size    Order Specific Question:   Preferred imaging location?    Answer:   [facility]  . POCT Skin KOH    Meds ordered this encounter  Medications  .  Zoster Vaccine Live, PF, (ZOSTAVAX) 13086 UNT/0.65ML injection    Sig: Inject 19,400 Units into the skin once.    Dispense:  1 vial    Refill:  0  . clotrimazole-betamethasone (LOTRISONE) cream    Sig: Apply 1 application topically 2 (two) times daily.    Dispense:  30 g    Refill:  0     Delman Cheadle, M.D.  [facility] [address] [place], [place] [address] [phone] phone [phone] fax  03/09/16 10:38 PM

## 2016-03-08 NOTE — Telephone Encounter (Signed)
Left message- she can come in today to see Dr. Brigitte Pulse If patient calls back, she can schedule an appointment for 1130, 3,4,5-530pm.  Thanks,

## 2016-03-08 NOTE — Patient Instructions (Addendum)
Try any topical antifungal - like terbinafine or clotrimazole - twice a day for a week to the rash.   IF you received an x-ray today, you will receive an invoice from [facility]. Please contact [facility] at [phone] with questions or concerns regarding your invoice.   IF you received labwork today, you will receive an invoice from [employer]. Please contact [facility] at [phone] with questions or concerns regarding your invoice.   Our billing staff will not be able to assist you with questions regarding bills from these companies.  You will be contacted with the lab results as soon as they are available. The fastest way to get your results is to activate your My Chart account. Instructions are located on the last page of this paperwork. If you have not heard from [place] regarding the results in 2 weeks, please contact this office.    Body Ringworm Ringworm (tinea corporis) is a fungal infection of the skin on the body. This infection is not caused by worms, but is actually caused by a fungus. Fungus normally lives on the top of your skin and can be useful. However, in the case of ringworms, the fungus grows out of control and causes a skin infection. It can involve any area of skin on the body and can spread easily from one person to another (contagious). Ringworm is a common problem for children, but it can affect adults as well. Ringworm is also often found in athletes, especially wrestlers who share equipment and mats.  CAUSES  Ringworm of the body is caused by a fungus called dermatophyte. It can spread by:  Touchingother people who are infected.  Touchinginfected pets.  Touching or sharingobjects that have been in contact with the infected person or pet (hats, combs, towels, clothing, sports equipment). SYMPTOMS   Itchy, raised red spots and bumps on the skin.  Ring-shaped rash.  Redness near the border of the rash with a clear  center.  Dry and scaly skin on or around the rash. Not every person develops a ring-shaped rash. Some develop only the red, scaly patches. DIAGNOSIS  Most often, ringworm can be diagnosed by performing a skin exam. Your caregiver may choose to take a skin scraping from the affected area. The sample will be examined under the microscope to see if the fungus is present.  TREATMENT  Body ringworm may be treated with a topical antifungal cream or ointment. Sometimes, an antifungal shampoo that can be used on your body is prescribed. You may be prescribed antifungal medicines to take by mouth if your ringworm is severe, keeps coming back, or lasts a long time.  HOME CARE INSTRUCTIONS   Only take over-the-counter or prescription medicines as directed by your caregiver.  Wash the infected area and dry it completely before applying yourcream or ointment.  When using antifungal shampoo to treat the ringworm, leave the shampoo on the body for 3-5 minutes before rinsing.   Wear loose clothing to stop clothes from rubbing and irritating the rash.  Wash or change your bed sheets every night while you have the rash.  Have your pet treated by your veterinarian if it has the same infection. To prevent ringworm:   Practice good hygiene.  Wear sandals or shoes in public places and showers.  Do not share personal items with others.  Avoid touching red patches of skin on other people.  Avoid touching pets that have bald spots or wash your hands after doing so. [place]  CARE IF:   Your rash continues to spread after 7 days of treatment.  Your rash is not gone in 4 weeks.  The area around your rash becomes red, warm, tender, and swollen.   This information is not intended to replace advice given to you by your health care provider. Make sure you discuss any questions you have with your health care provider.   Document Released: 04/27/2000 Document Revised: 01/23/2012 Document Reviewed:  11/12/2011 Elsevier Interactive Patient Education [employer].

## 2016-03-19 ENCOUNTER — Other Ambulatory Visit: Payer: Self-pay | Admitting: Gastroenterology

## 2016-03-26 DIAGNOSIS — Z961 Presence of intraocular lens: Secondary | ICD-10-CM | POA: Diagnosis not present

## 2016-03-26 DIAGNOSIS — H353130 Nonexudative age-related macular degeneration, bilateral, stage unspecified: Secondary | ICD-10-CM | POA: Diagnosis not present

## 2016-03-26 DIAGNOSIS — H02403 Unspecified ptosis of bilateral eyelids: Secondary | ICD-10-CM | POA: Diagnosis not present

## 2016-03-26 LAB — HM DIABETES EYE EXAM

## 2016-03-28 NOTE — Progress Notes (Signed)
Subjective:    Emily [name] is a 65 y.o. female who presents for Medicare Initial preventive examination. Her welcome to Medicare exam was done September 2016.  Preventive Screening-Counseling & Management  Tobacco History  Smoking Status  . Never Smoker  Smokeless Tobacco  . Never Used     Problems Prior to Visit 1. Occ constipation  Current Problems (verified) Patient Active Problem List   Diagnosis Date Noted  . Prediabetes 11/21/2012  . Breast fibrocystic disorder 11/21/2012  . Chest pain 11/21/2012  . Anxiety state, unspecified 11/21/2012  . Incidental lung nodule, > 51mm and < 62mm 02/15/2012    Medications Prior to Visit Current Outpatient Prescriptions on File Prior to Visit  Medication Sig Dispense Refill  . Calcium Citrate-Vitamin D (CALCIUM CITRATE + D PO) Take 1 tablet by mouth every morning.    . clotrimazole-betamethasone (LOTRISONE) cream Apply 1 application topically 2 (two) times daily. 30 g 0  . hydrOXYzine (ATARAX/VISTARIL) 25 MG tablet Take 1 tablet (25 mg total) by mouth every 8 (eight) hours as needed for itching. 90 tablet 1   No current facility-administered medications on file prior to visit.     Current Medications (verified) Current Outpatient Prescriptions  Medication Sig Dispense Refill  . Calcium Citrate-Vitamin D (CALCIUM CITRATE + D PO) Take 1 tablet by mouth every morning.    . clotrimazole-betamethasone (LOTRISONE) cream Apply 1 application topically 2 (two) times daily. 30 g 0  . hydrOXYzine (ATARAX/VISTARIL) 25 MG tablet Take 1 tablet (25 mg total) by mouth every 8 (eight) hours as needed for itching. 90 tablet 1   No current facility-administered medications for this visit.      Allergies (verified) Latex   PAST HISTORY  Family History Family History  Problem Relation Age of Onset  . Diabetes Brother   . Hypertension Brother   . Alcohol abuse Brother   . Mental illness Brother     nervous breakdown  . Stroke Maternal  Grandmother   . Depression Paternal Grandmother   . Arthritis Mother   . COPD Father 47    decsd due to lack of oxygen  . Hypertension Father   . Alcohol abuse Father   . Gout Father   . Alcohol abuse Brother   . Hypertension Brother     Social History Social History  Substance Use Topics  . Smoking status: Never Smoker  . Smokeless tobacco: Never Used  . Alcohol use No     Are there smokers in your home (other than you)? No - living alone  Risk Factors Current exercise habits: The patient has a physically strenuous job, but has no regular exercise apart from work.  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  Depression Screen Depression screen [facility] 2/9 03/29/2016 03/08/2016 02/23/2016 10/26/2015 08/18/2015  Decreased Interest 0 0 0 0 0  Down, Depressed, Hopeless 0 0 0 0 0  PHQ - 2 Score 0 0 0 0 0   Activities of Daily Living In your present state of health, do you have any difficulty performing the following activities?:  Driving? No Managing money?  No Feeding yourself? No Getting from bed to chair? No Climbing a flight of stairs? No Preparing food  and eating?: No Bathing or showering? No Getting dressed: No Getting to the toilet? No Using the toilet:No Moving around from place to place: No In the past year have you fallen or had a near fall?:No   Are you sexually active?  No  Do you have more than one partner?  No  Hearing Difficulties: No Do you often ask people to speak up or repeat themselves? No Do you experience ringing or noises in your ears? No Do you have difficulty understanding soft or whispered voices? No   Do you feel that you have a problem with memory? No  Do you often  misplace items? Yes  Do you feel safe at home?  Yes  Cognitive Testing  Alert? Yes  Normal Appearance?Yes  Oriented to person? Yes  Place? Yes   Time? Yes  Recall of three objects?  Yes  Can perform simple calculations? Yes  Displays appropriate judgment?Yes  Can read the correct time from a watch face?Yes   Advanced Directives have been discussed with the patient? Yes  List the Names of Other Physician/Practitioners you currently use: 1.  Ophthalmology Dr. Pete Pelt any recent Medical Services you may have received from other than [facility] providers in the past year (date may be approximate).  Immunization History  Administered Date(s) Administered  . Influenza,inj,Quad PF,36+ Mos 01/27/2015  . Influenza-Unspecified 01/29/2013, 02/25/2016  . Pneumococcal Conjugate-13 01/27/2015    Screening Tests Health Maintenance  Topic Date Due  . FOOT EXAM  01/27/2016  . URINE MICROALBUMIN  01/27/2016  . PNA vac Low Risk Adult (2 of 2 - PPSV23) 01/27/2016  . ZOSTAVAX  08/11/2016 (Originally 04/17/2009)  . OPHTHALMOLOGY EXAM  07/12/2016  . HEMOGLOBIN A1C  08/23/2016  . MAMMOGRAM  10/24/2016  . TETANUS/TDAP  05/14/2017  . COLONOSCOPY  04/10/2024  . INFLUENZA VACCINE  Completed  . DEXA SCAN  Completed  . Hepatitis C Screening  Completed    All answers were reviewed with the patient and necessary referrals were made:  Carthel Castille, MD   03/28/2016   History reviewed: allergies, current medications, past family history, past medical history, past social history, past surgical history and problem list  Review of Systems Pertinent items are noted in HPI.     Objective:  BP 102/68   Pulse 81   Temp 97.4 F (36.3 C) (Oral)   Resp 18   Ht 5' 2.25" (1.581 m)   Wt 114 lb (51.7 kg)   SpO2 99%   BMI 20.68 kg/m   Visual Acuity Screening   Right eye Left eye Both eyes  Without correction: 20/50 20/30 20/25-1  With correction:       BP 102/68   Pulse 81   Temp 97.4 F (36.3  C) (Oral)   Resp 18   Ht 5' 2.25" (1.581 m)   Wt 114 lb (51.7 kg)   SpO2 99%   BMI 20.68 kg/m   General Appearance:    Alert, cooperative, no distress, appears stated age  Head:    Normocephalic, without obvious abnormality, atraumatic  Eyes:    PERRL, conjunctiva/corneas clear, EOM's intact, fundi    benign, both eyes  Ears:    Normal TM's and external ear canals, both ears  Nose:   Nares normal, septum midline, mucosa normal, no drainage    or sinus tenderness  Throat:   Lips, mucosa, and tongue normal; teeth and gums normal  Neck:   Supple, symmetrical, trachea midline, no adenopathy;    thyroid:  no enlargement/tenderness/nodules; no carotid   bruit or JVD  Back:     Symmetric, no curvature, ROM normal, no CVA tenderness  Lungs:     Clear to auscultation bilaterally, respirations unlabored  Chest Wall:    No tenderness or deformity   Heart:    Regular rate and rhythm, S1 and S2 normal, no murmur, rub   or gallop  Breast Exam:    No tenderness, masses, or nipple abnormality  Abdomen:     Soft, non-tender, bowel sounds active all four quadrants,    no masses, no organomegaly        Extremities:   Extremities normal, atraumatic, no cyanosis or edema  Pulses:   2+ and symmetric all extremities  Skin:   Skin color, texture, turgor normal, no rashes or lesions  Lymph nodes:   Cervical, supraclavicular, and axillary nodes normal  Neurologic:   CNII-XII intact, normal strength, sensation and reflexes    throughout        Assessment:     1. Medicare annual wellness visit, initial   2. Screening for cardiovascular, respiratory, and genitourinary diseases   3. Encounter for screening mammogram for breast cancer   4. Screening for colorectal cancer   5. Prediabetes   6. Need for prophylactic vaccination against Streptococcus pneumoniae (pneumococcus)         Plan:  Needs Pneumovax. ua Do foot exam. Needs urine microalbumin  Needs Zostavax and plans to get soon Mammogram  scheduled - she is waiting to get insurance authorization she suspects as she has been referred but hasn't heard from them.  Lipid and cmp one month prior were normal. CBC 7 months prior normal. TSH checked over 2 years prior normal along with vitamin D 51 at that time. Has allergist appt on 11/28   During the course of the visit the patient was educated and counseled about appropriate screening and preventive services including:      Immunizations: Tetanus in 2009; Prevnar 01/2015. Flu 02/2016 Did not get Zostavax but it is next on her list  Colorectal cancer screening: I think patient had colonoscopy in 2009 at [place]. She thinks that she was told repeat in 5 years but unconfirmed. GI referral to [facility] placed last year but unsure what happened to this. Note in health maintenance states that patient had colonoscopy 04/10/2014 that was normal, repeat in 10 years. Scheduled to see GI for colonoscopy on Dec 12.  Breast cancer screening: Nml mammogram done 10/2014 - we ordered this but pt has not heard about it yet.  Cervical cancer screening: Did have fibroids and benign endometrial polyps removed in 2000. Multiple normal with no history of abnormal so no further needed. Last done 65 years old.  Bone density screen: DEXA done November 2016 with L-spine T score - 0.9 [facility] so bone mass normal.  Diet review for nutrition referral? Yes ____  Not Indicated __X__  Hyperlipidemia: Patient work on [place]. Last year I noted that ASCVD risk is 4% below the usual risk of 4.9% so pt is doing quite well on her current lifestyle regimen, continue. Prediabetes: Patient keeps very close eye sugars and has done an excellent job at [employer]. A1c one month prior 5.9   Patient Instructions (the written plan) was given to the patient.  Medicare Attestation I have personally reviewed: The patient's medical and social history Their use of alcohol, tobacco or illicit drugs Their current medications  and supplements The patient's functional ability including ADLs,fall risks,  home safety risks, cognitive, and hearing and visual impairment Diet and physical activities Evidence for depression or mood disorders  The patient's weight, height, BMI, and visual acuity have been recorded in the chart.  I have made referrals, counseling, and provided education to the patient based on review of the above and I have provided the patient with a written personalized care plan for preventive services.     Delman Cheadle, MD   03/28/2016    Orders Placed This Encounter  Procedures  . Pneumococcal polysaccharide vaccine 23-valent greater than or equal to 2yo subcutaneous/IM  . Microalbumin/Creatinine Ratio, Urine  . POCT urinalysis dipstick  . HM DIABETES FOOT EXAM    Delman Cheadle, M.D.  [facility] [address] [place], [place] [address] [phone] phone [phone] fax  04/12/16 10:18 AM

## 2016-03-29 ENCOUNTER — Encounter: Payer: Self-pay | Admitting: Family Medicine

## 2016-03-29 ENCOUNTER — Ambulatory Visit (INDEPENDENT_AMBULATORY_CARE_PROVIDER_SITE_OTHER): Payer: Medicare Other | Admitting: Family Medicine

## 2016-03-29 VITALS — BP 102/68 | HR 81 | Temp 97.4°F | Resp 18 | Ht 62.25 in | Wt 114.0 lb

## 2016-03-29 DIAGNOSIS — Z1212 Encounter for screening for malignant neoplasm of rectum: Secondary | ICD-10-CM

## 2016-03-29 DIAGNOSIS — Z136 Encounter for screening for cardiovascular disorders: Secondary | ICD-10-CM | POA: Diagnosis not present

## 2016-03-29 DIAGNOSIS — Z23 Encounter for immunization: Secondary | ICD-10-CM | POA: Diagnosis not present

## 2016-03-29 DIAGNOSIS — Z1383 Encounter for screening for respiratory disorder NEC: Secondary | ICD-10-CM

## 2016-03-29 DIAGNOSIS — Z1231 Encounter for screening mammogram for malignant neoplasm of breast: Secondary | ICD-10-CM

## 2016-03-29 DIAGNOSIS — Z1211 Encounter for screening for malignant neoplasm of colon: Secondary | ICD-10-CM | POA: Diagnosis not present

## 2016-03-29 DIAGNOSIS — Z0000 Encounter for general adult medical examination without abnormal findings: Secondary | ICD-10-CM

## 2016-03-29 DIAGNOSIS — R7303 Prediabetes: Secondary | ICD-10-CM

## 2016-03-29 DIAGNOSIS — Z1389 Encounter for screening for other disorder: Secondary | ICD-10-CM | POA: Diagnosis not present

## 2016-03-29 LAB — POCT URINALYSIS DIP (MANUAL ENTRY)
BILIRUBIN UA: NEGATIVE
BILIRUBIN UA: NEGATIVE
GLUCOSE UA: NEGATIVE
Leukocytes, UA: NEGATIVE
NITRITE UA: NEGATIVE
PH UA: 6
Protein Ur, POC: NEGATIVE
RBC UA: NEGATIVE
SPEC GRAV UA: 1.025
Urobilinogen, UA: 0.2

## 2016-03-29 LAB — MICROALBUMIN / CREATININE URINE RATIO
Creatinine, Urine: 181 mg/dL (ref 20–320)
MICROALB UR: 1 mg/dL
Microalb Creat Ratio: 6 mcg/mg creat (ref <30)

## 2016-03-29 NOTE — Patient Instructions (Signed)
     IF you received an x-ray today, you will receive an invoice from [facility]. Please contact [facility] at [phone] with questions or concerns regarding your invoice.   IF you received labwork today, you will receive an invoice from [facility]. Please contact [facility] at [phone] with questions or concerns regarding your invoice.   Our billing staff will not be able to assist you with questions regarding bills from these companies.  You will be contacted with the lab results as soon as they are available. The fastest way to get your results is to activate your My Chart account. Instructions are located on the last page of this paperwork. If you have not heard from us regarding the results in 2 weeks, please contact this office.      

## 2016-04-10 DIAGNOSIS — R21 Rash and other nonspecific skin eruption: Secondary | ICD-10-CM | POA: Diagnosis not present

## 2016-04-10 DIAGNOSIS — J300 Vasomotor rhinitis: Secondary | ICD-10-CM | POA: Diagnosis not present

## 2016-04-20 ENCOUNTER — Encounter (HOSPITAL_COMMUNITY): Payer: Self-pay

## 2016-04-24 ENCOUNTER — Ambulatory Visit (HOSPITAL_COMMUNITY)
Admission: RE | Admit: 2016-04-24 | Discharge: 2016-04-24 | Disposition: A | Discharge status: Home / Self Care | Payer: Medicare Other | Source: Ambulatory Visit | Attending: Gastroenterology | Admitting: Gastroenterology

## 2016-04-24 ENCOUNTER — Ambulatory Visit (HOSPITAL_COMMUNITY): Payer: Medicare Other | Admitting: Certified Registered Nurse Anesthetist

## 2016-04-24 ENCOUNTER — Encounter (HOSPITAL_COMMUNITY)
Admission: RE | Disposition: A | Discharge status: Home / Self Care | Payer: Self-pay | Source: Ambulatory Visit | Attending: Gastroenterology

## 2016-04-24 ENCOUNTER — Encounter (HOSPITAL_COMMUNITY): Payer: Self-pay

## 2016-04-24 DIAGNOSIS — Z1211 Encounter for screening for malignant neoplasm of colon: Secondary | ICD-10-CM | POA: Diagnosis not present

## 2016-04-24 DIAGNOSIS — J449 Chronic obstructive pulmonary disease, unspecified: Secondary | ICD-10-CM | POA: Insufficient documentation

## 2016-04-24 DIAGNOSIS — R7303 Prediabetes: Secondary | ICD-10-CM | POA: Diagnosis not present

## 2016-04-24 DIAGNOSIS — R079 Chest pain, unspecified: Secondary | ICD-10-CM | POA: Diagnosis not present

## 2016-04-24 DIAGNOSIS — F419 Anxiety disorder, unspecified: Secondary | ICD-10-CM | POA: Diagnosis not present

## 2016-04-24 HISTORY — PX: COLONOSCOPY WITH PROPOFOL: SHX5780

## 2016-04-24 SURGERY — COLONOSCOPY WITH PROPOFOL
Anesthesia: Monitor Anesthesia Care

## 2016-04-24 MED ORDER — PROPOFOL 10 MG/ML IV BOLUS
INTRAVENOUS | Status: AC
  Filled 2016-04-24: qty 40

## 2016-04-24 MED ORDER — ONDANSETRON HCL 4 MG/2ML IJ SOLN
INTRAMUSCULAR | Status: DC | PRN
  Administered 2016-04-24: 4 mg via INTRAVENOUS

## 2016-04-24 MED ORDER — PROPOFOL 500 MG/50ML IV EMUL
INTRAVENOUS | Status: DC | PRN
  Administered 2016-04-24: 125 ug/kg/min via INTRAVENOUS

## 2016-04-24 MED ORDER — ONDANSETRON HCL 4 MG/2ML IJ SOLN
INTRAMUSCULAR | Status: AC
  Filled 2016-04-24: qty 2

## 2016-04-24 MED ORDER — LIDOCAINE 2% (20 MG/ML) 5 ML SYRINGE
INTRAMUSCULAR | Status: AC
  Filled 2016-04-24: qty 5

## 2016-04-24 MED ORDER — PROPOFOL 10 MG/ML IV BOLUS
INTRAVENOUS | Status: DC | PRN
  Administered 2016-04-24: 20 mg via INTRAVENOUS

## 2016-04-24 MED ORDER — LACTATED RINGERS IV SOLN
INTRAVENOUS | Status: DC | PRN
  Administered 2016-04-24: 12:00:00 via INTRAVENOUS

## 2016-04-24 SURGICAL SUPPLY — 22 items

## 2016-04-24 NOTE — Transfer of Care (Signed)
Immediate Anesthesia Transfer of Care Note  Patient: Emily [name]  Procedure(s) Performed: Procedure(s): COLONOSCOPY WITH PROPOFOL (N/A)  Patient Location: PACU  Anesthesia Type:MAC  Level of Consciousness:  sedated, patient cooperative and responds to stimulation  Airway & Oxygen Therapy:Patient Spontanous Breathing and Patient connected to face mask oxgen  Post-op Assessment:  Report given to PACU RN and Post -op Vital signs reviewed and stable  Post vital signs:  Reviewed and stable  Last Vitals: There were no vitals filed for this visit.  Complications: No apparent anesthesia complications

## 2016-04-24 NOTE — Discharge Instructions (Signed)

## 2016-04-24 NOTE — Anesthesia Preprocedure Evaluation (Signed)
Anesthesia Evaluation  Patient identified by MRN, date of birth, ID band Patient awake    Reviewed: Allergy & Precautions, NPO status , Patient's Chart, lab work & pertinent test results  Airway Mallampati: II  TM Distance: >3 FB Neck ROM: Full    Dental no notable dental hx.    Pulmonary neg pulmonary ROS,    Pulmonary exam normal breath sounds clear to auscultation       Cardiovascular negative cardio ROS Normal cardiovascular exam Rhythm:Regular Rate:Normal     Neuro/Psych negative neurological ROS  negative psych ROS   GI/Hepatic negative GI ROS, Neg liver ROS,   Endo/Other  negative endocrine ROS  Renal/GU negative Renal ROS  negative genitourinary   Musculoskeletal negative musculoskeletal ROS (+)   Abdominal   Peds negative pediatric ROS (+)  Hematology negative hematology ROS (+)   Anesthesia Other Findings   Reproductive/Obstetrics negative OB ROS                             Anesthesia Physical Anesthesia Plan  ASA: II  Anesthesia Plan: MAC   Post-op Pain Management:    Induction:   Airway Management Planned: Simple Face Mask  Additional Equipment:   Intra-op Plan:   Post-operative Plan:   Informed Consent: I have reviewed the patients History and Physical, chart, labs and discussed the procedure including the risks, benefits and alternatives for the proposed anesthesia with the patient or authorized representative who has indicated his/her understanding and acceptance.   Dental advisory given  Plan Discussed with: CRNA  Anesthesia Plan Comments:         Anesthesia Quick Evaluation  

## 2016-04-24 NOTE — Anesthesia Postprocedure Evaluation (Signed)
Anesthesia Post Note  Patient: Emily [name]  Procedure(s) Performed: Procedure(s) (LRB): COLONOSCOPY WITH PROPOFOL (N/A)  Patient location during evaluation: Endoscopy Anesthesia Type: MAC Level of consciousness: awake and alert Pain management: pain level controlled Vital Signs Assessment: post-procedure vital signs reviewed and stable Respiratory status: spontaneous breathing, nonlabored ventilation, respiratory function stable and patient connected to nasal cannula oxygen Cardiovascular status: stable and blood pressure returned to baseline Anesthetic complications: no    Last Vitals:  Vitals:   04/24/16 1247 04/24/16 1310  BP: 111/65 102/83  Pulse: 77   Resp: 16   Temp: 36.4 C     Last Pain:  Vitals:   04/24/16 1247  TempSrc: Oral                 Montez Hageman
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
## 2016-04-24 NOTE — Op Note (Signed)
[facility] Patient Name: Emily [name] Procedure Date: 04/24/2016 MRN: [id number] Attending MD: Garlan Fair , MD Date of Birth: January 31, 1951 CSN: [id number] Age: 65 Admit Type: Outpatient Procedure:                Colonoscopy Indications:              Screening for colorectal malignant neoplasm Providers:                Garlan Fair, MD, Dustin Flock RN, RN, Corliss Parish, Technician Referring MD:              Medicines:                Propofol per Anesthesia Complications:            No immediate complications. Estimated Blood Loss:     Estimated blood loss: none. Procedure:                Pre-Anesthesia Assessment:                           - Prior to the procedure, a History and Physical                            was performed, and patient medications and                            allergies were reviewed. The patient's tolerance of                            previous anesthesia was also reviewed. The risks                            and benefits of the procedure and the sedation                            options and risks were discussed with the patient.                            All questions were answered, and informed consent                            was obtained. Prior Anticoagulants: The patient has                            taken no previous anticoagulant or antiplatelet                            agents. ASA Grade Assessment: II - A patient with                            mild systemic disease. After reviewing the risks  and benefits, the patient was deemed in                            satisfactory condition to undergo the procedure.                           After obtaining informed consent, the colonoscope                            was passed under direct vision. Throughout the                            procedure, the patient's blood pressure, pulse, and   oxygen saturations were monitored continuously. The                            [id number] scope was introduced through                            the anus and advanced to the the cecum, identified                            by appendiceal orifice and ileocecal valve. The                            colonoscopy was somewhat difficult due to                            significant looping. The patient tolerated the                            procedure well. The quality of the bowel                            preparation was good. The appendiceal orifice and                            the rectum were photographed. Scope In: 12:22:32 PM Scope Out: 12:40:26 PM Scope Withdrawal Time: 0 hours 4 minutes 7 seconds  Total Procedure Duration: 0 hours 17 minutes 54 seconds  Findings:      The perianal and digital rectal examinations were normal.      The entire examined colon appeared normal. Impression:               - The entire examined colon is normal.                           - No specimens collected. Moderate Sedation:      N/A- Per Anesthesia Care Recommendation:           - Patient has a contact number available for                            emergencies. The signs and symptoms of potential  delayed complications were discussed with the                            patient. Return to normal activities tomorrow.                            Written discharge instructions were provided to the                            patient.                           - Repeat colonoscopy in 10 years for screening                            purposes.                           - Resume previous diet.                           - Continue present medications. Procedure Code(s):        --- Professional ---                           [id number], Colorectal cancer screening; colonoscopy on                            individual not meeting criteria for high risk Diagnosis Code(s):         --- Professional ---                           Z12.11, Encounter for screening for malignant                            neoplasm of colon CPT copyright 2016 American Medical Association. All rights reserved. The codes documented in this report are preliminary and upon coder review may  be revised to meet current compliance requirements. Earle Gell, MD Garlan Fair, MD 04/24/2016 12:45:33 PM This report has been signed electronically. Number of Addenda: 0

## 2016-04-24 NOTE — Anesthesia Procedure Notes (Signed)
Procedure Name: MAC Date/Time: 04/24/2016 12:17 PM Performed by: West Pugh Pre-anesthesia Checklist: Patient identified, Timeout performed, Emergency Drugs available, Suction available and Patient being monitored Patient Re-evaluated:Patient Re-evaluated prior to inductionOxygen Delivery Method: Simple face mask Placement Confirmation: positive ETCO2 Dental Injury: Teeth and Oropharynx as per pre-operative assessment

## 2016-04-26 ENCOUNTER — Encounter (HOSPITAL_COMMUNITY): Payer: Self-pay | Admitting: Gastroenterology

## 2016-05-17 ENCOUNTER — Telehealth: Payer: Self-pay

## 2016-05-17 NOTE — Telephone Encounter (Signed)
Pt called office asking about her mammogram dr. Brigitte Pulse ordered   I called back but couldn't leave a vm because her mailbox is full   She needs to call [facility] to set up her appt

## 2016-05-24 ENCOUNTER — Other Ambulatory Visit: Payer: Self-pay | Admitting: Family Medicine

## 2016-05-24 ENCOUNTER — Ambulatory Visit
Admission: RE | Admit: 2016-05-24 | Discharge: 2016-05-24 | Disposition: A | Discharge status: Home / Self Care | Payer: Medicare Other | Source: Ambulatory Visit | Attending: Family Medicine | Admitting: Family Medicine

## 2016-05-24 DIAGNOSIS — R928 Other abnormal and inconclusive findings on diagnostic imaging of breast: Secondary | ICD-10-CM

## 2016-05-24 DIAGNOSIS — Z1239 Encounter for other screening for malignant neoplasm of breast: Secondary | ICD-10-CM

## 2016-05-24 DIAGNOSIS — N63 Unspecified lump in unspecified breast: Secondary | ICD-10-CM

## 2016-05-24 DIAGNOSIS — N6310 Unspecified lump in the right breast, unspecified quadrant: Secondary | ICD-10-CM

## 2016-05-24 DIAGNOSIS — N6321 Unspecified lump in the left breast, upper outer quadrant: Secondary | ICD-10-CM

## 2016-05-24 DIAGNOSIS — N6489 Other specified disorders of breast: Secondary | ICD-10-CM | POA: Diagnosis not present

## 2016-05-24 DIAGNOSIS — N6311 Unspecified lump in the right breast, upper outer quadrant: Secondary | ICD-10-CM

## 2016-05-24 DIAGNOSIS — R922 Inconclusive mammogram: Secondary | ICD-10-CM | POA: Diagnosis not present

## 2016-05-25 ENCOUNTER — Other Ambulatory Visit: Payer: Self-pay | Admitting: Family Medicine

## 2016-05-25 DIAGNOSIS — R928 Other abnormal and inconclusive findings on diagnostic imaging of breast: Secondary | ICD-10-CM

## 2016-05-25 DIAGNOSIS — N6310 Unspecified lump in the right breast, unspecified quadrant: Secondary | ICD-10-CM

## 2016-05-28 ENCOUNTER — Ambulatory Visit
Admission: RE | Admit: 2016-05-28 | Discharge: 2016-05-28 | Disposition: A | Discharge status: Home / Self Care | Payer: Medicare Other | Source: Ambulatory Visit | Attending: Family Medicine | Admitting: Family Medicine

## 2016-05-28 DIAGNOSIS — N6310 Unspecified lump in the right breast, unspecified quadrant: Secondary | ICD-10-CM

## 2016-05-28 DIAGNOSIS — R928 Other abnormal and inconclusive findings on diagnostic imaging of breast: Secondary | ICD-10-CM

## 2016-05-28 DIAGNOSIS — N6031 Fibrosclerosis of right breast: Secondary | ICD-10-CM | POA: Diagnosis not present

## 2016-05-28 DIAGNOSIS — N6489 Other specified disorders of breast: Secondary | ICD-10-CM | POA: Diagnosis not present

## 2016-05-28 HISTORY — PX: BREAST BIOPSY: SHX20

## 2016-06-16 ENCOUNTER — Encounter: Payer: Self-pay | Admitting: Family Medicine

## 2016-06-16 ENCOUNTER — Ambulatory Visit (INDEPENDENT_AMBULATORY_CARE_PROVIDER_SITE_OTHER): Payer: Medicare Other | Admitting: Family Medicine

## 2016-06-16 VITALS — BP 98/56 | HR 98 | Temp 98.2°F | Resp 16 | Ht 62.5 in | Wt 121.0 lb

## 2016-06-16 DIAGNOSIS — N6012 Diffuse cystic mastopathy of left breast: Secondary | ICD-10-CM | POA: Diagnosis not present

## 2016-06-16 DIAGNOSIS — Z23 Encounter for immunization: Secondary | ICD-10-CM | POA: Diagnosis not present

## 2016-06-16 DIAGNOSIS — N6011 Diffuse cystic mastopathy of right breast: Secondary | ICD-10-CM | POA: Diagnosis not present

## 2016-06-16 MED ORDER — ZOSTER VACCINE LIVE 19400 UNT/0.65ML ~~LOC~~ SUSR: 0.6500 mL | Freq: Once | SUBCUTANEOUS | 0 refills | Status: AC

## 2016-06-16 NOTE — Patient Instructions (Addendum)
IF you received an x-ray today, you will receive an invoice from [facility]. Please contact [facility] at [phone] with questions or concerns regarding your invoice.   IF you received labwork today, you will receive an invoice from [place]. Please contact [facility] at [phone] with questions or concerns regarding your invoice.   Our billing staff will not be able to assist you with questions regarding bills from these companies.  You will be contacted with the lab results as soon as they are available. The fastest way to get your results is to activate your My Chart account. Instructions are located on the last page of this paperwork. If you have not heard from [place] regarding the results in 2 weeks, please contact this office.      Fibrocystic Breast Changes Fibrocystic breast changes are changes in breast tissue that can cause breasts to become swollen, lumpy, or painful. This can happen due to buildup of scar-like tissue (fibrous tissue) or the forming of fluid-filled lumps (cysts) in the breast. This is a common condition, and it is not cancerous (is benign). The exact cause is not known, but it seems to occur when women go through hormonal changes during their menstrual cycle. Fibrocystic breast changes can affect one or both breasts. What are the causes? The exact cause of fibrocystic breast changes is not known. However, this condition:  May be related to the female hormones estrogen and progesterone.  May be influenced by family traits that get passed from parent to child (genetics). What are the signs or symptoms? Symptoms of this condition may affect one or both breasts, and may include:  Tenderness, mild discomfort, or pain.  Swelling.  Rope-like tissue that can be felt when touching the breast.  Lumps in one or both breasts.  Changes in breast size. Breasts may get larger before the menstrual period and smaller after the menstrual  period.  Green or dark brown discharge from the nipple. Symptoms are usually worse before menstrual periods start, and they get better toward the end of menstrual periods. How is this diagnosed? This condition is diagnosed based on your medical history and a physical exam of your breasts. You may also have tests, such as:  A breast X-ray (mammogram).  Ultrasound of your breasts.  MRI.  Removal of a breast tissue sample for testing (breast biopsy). This may be done if your health care provider thinks that something else may be causing changes in your breasts. How is this treated? Often, treatment is not needed for this condition. In some cases, treatment may include:  Taking over-the-counter pain relievers to help lessen pain or discomfort.  Limiting or avoiding caffeine. Foods and beverages that contain caffeine include chocolate, soda, coffee, and tea.  Reducing sugar and fat in your diet. Your health care provider may also recommend:  A procedure to remove fluid from a cyst that is causing pain (fine needle aspiration).  Surgery to remove a cyst that is large or tender or does not go away. Follow these instructions at home:  Examine your breasts after every menstrual period. If you do not have menstrual periods, check your breasts on the first day of every month. Feel for changes in your breasts, such as:  More tenderness.  A new growth.  A change in size.  A change in an existing lump.  Take over-the-counter and prescription medicines only as told by your health care provider.  Wear a well-fitted support or sports bra, especially when exercising.  Decrease or avoid caffeine, fat, and sugar in your diet as directed by your health care provider. Contact a health care provider if:  You have fluid leaking from your nipple, especially if it is bloody.  You have new lumps or bumps in your breast.  Your breast becomes enlarged, red, and painful.  You have areas of your  breast that pucker inward.  Your nipple appears flat or indented. Get help right away if:  You have redness of your breast and the redness is spreading. Summary  Fibrocystic breast changes are changes in breast tissue that can cause breasts to become swollen, lumpy, or painful.  This condition may be related to the female hormones estrogen and progesterone.  With this condition, it is important to examine your breasts after every menstrual period. If you do not have menstrual periods, check your breasts on the first day of every month. This information is not intended to replace advice given to you by your health care provider. Make sure you discuss any questions you have with your health care provider. Document Released: 02/14/2006 Document Revised: 01/10/2016 Document Reviewed: 12/28/2015 Elsevier Interactive Patient Education  2017 [employer].

## 2016-06-16 NOTE — Progress Notes (Signed)
By signing my name below, I, Emily [name], attest that this documentation has been prepared under the direction and in the presence of Delman Cheadle, MD.  Electronically Signed: Verlee Monte, Medical Scribe. 06/16/16. 8:42 AM.  Subjective:    Patient ID: Emily [name], female    DOB: 10/18/1950, 66 y.o.   MRN: [id number]  HPI Chief Complaint  Patient presents with  . Follow-up    breast biopsy    HPI Comments: Emily [name] is a 66 y.o. female who presents to the [facility] for biopsy follow-up.  Cancer Screening: Breast CA: Pt had right breast biopsy a couple of weeks ago with benign findings and she's healing well after the incision. Pt wasn't told how to decrease her risk of developing breast CA. Colon CA: Pt had her colonoscopy done recently by Dr. Hassell Done  Skin: Pt mentions when she gets stressed out she starts to break out.   Immunizations: Pt would like to get her shingles vaccine today. She suspects she had a shingles outbreak before 2011. Immunization History  Administered Date(s) Administered  . Influenza,inj,Quad PF,36+ Mos 01/27/2015  . Influenza-Unspecified 01/29/2013, 02/25/2016  . Pneumococcal Conjugate-13 01/27/2015  . Pneumococcal Polysaccharide-23 03/29/2016    Patient Active Problem List   Diagnosis Date Noted  . Prediabetes 11/21/2012  . Breast fibrocystic disorder 11/21/2012  . Chest pain 11/21/2012  . Anxiety state, unspecified 11/21/2012  . Incidental lung nodule, > 38mm and < 77mm 02/15/2012   Past Medical History:  Diagnosis Date  . Anemia    iron runs low  . Anxiety   . Bronchitis   . Cataract   . History of chicken pox   . History of uterine fibroid   . Nontraumatic tear of skin    right side of vagina skin since 06-12-15, small amount of blood when wipes  . Tuberculosis    exposed as a young child   Past Surgical History:  Procedure Laterality Date  . COLONOSCOPY WITH PROPOFOL N/A 04/24/2016   Procedure: COLONOSCOPY  WITH PROPOFOL;  Surgeon: Garlan Fair, MD;  Location: [facility];  Service: Endoscopy;  Laterality: N/A;  . EYE SURGERY Bilateral    lens replacements  . FRACTURE SURGERY Right 2003   fibula  . surgery for fibroids  2000   Allergies  Allergen Reactions  . Latex Itching and Rash   Prior to Admission medications   Medication Sig Start Date End Date Taking? Authorizing Provider  aspirin EC 81 MG tablet Take 81 mg by mouth 2 (two) times daily as needed for mild pain.   Yes Historical Provider, MD  Calcium Citrate-Vitamin D (CALCIUM CITRATE + D PO) Take 1 tablet by mouth every morning.   Yes Historical Provider, MD  COD LIVER OIL PO Take 5 mLs by mouth daily.   Yes Historical Provider, MD  Cyanocobalamin (B-12 PO) Take 1 tablet by mouth daily.   Yes Historical Provider, MD  diphenhydrAMINE (BENADRYL) 25 MG tablet Take 25 mg by mouth every 6 (six) hours as needed for itching or allergies.   Yes Historical Provider, MD  Ferrous Sulfate (IRON) 142 (45 Fe) MG TBCR Take 142 mg by mouth daily.   Yes Historical Provider, MD  Glucosamine HCl (GLUCOSAMINE PO) Take 1 tablet by mouth daily.   Yes Historical Provider, MD  Multiple Vitamins-Minerals (PRESERVISION AREDS 2) CAPS Take 1 tablet by mouth daily.   Yes Historical Provider, MD  VITAMIN E PO Take 1 tablet by mouth daily.   Yes  Historical Provider, MD  clotrimazole-betamethasone (LOTRISONE) cream Apply 1 application topically 2 (two) times daily. Patient not taking: Reported on 06/16/2016 03/08/16   Shawnee Knapp, MD  hydrOXYzine (ATARAX/VISTARIL) 25 MG tablet Take 1 tablet (25 mg total) by mouth every 8 (eight) hours as needed for itching. Patient not taking: Reported on 06/16/2016 02/23/16   Shawnee Knapp, MD  Phenylephrine-APAP-Guaifenesin [facility] FAST-MAX) [phone] MG/20ML LIQD Take 1 Dose by mouth daily as needed (congestion).    Historical Provider, MD   Social History   Social History  . Marital status: Divorced    Spouse name: N/A  .  Number of children: N/A  . Years of education: N/A   Occupational History  . Not on file.   Social History Main Topics  . Smoking status: Never Smoker  . Smokeless tobacco: Never Used  . Alcohol use No  . Drug use: No  . Sexual activity: No   Other Topics Concern  . Not on file   Social History Narrative  . No narrative on file   Review of Systems  Skin: Negative for rash.   Objective:  Physical Exam  Constitutional: She appears well-developed and well-nourished. No distress.  HENT:  Head: Normocephalic and atraumatic.  Eyes: Conjunctivae are normal.  Neck: Neck supple.  Cardiovascular: Normal rate, regular rhythm and normal heart sounds.  Exam reveals no gallop and no friction rub.   No murmur heard. Pulmonary/Chest: Effort normal and breath sounds normal. No respiratory distress. She has no wheezes. She has no rales.  Multiple well-defined lumps and masses on both breast around areola and most around upper outer quadrants   Neurological: She is alert.  Skin: Skin is warm and dry.  Psychiatric: She has a normal mood and affect. Her behavior is normal.  Nursing note and vitals reviewed.  BP (!) 98/56   Pulse 98   Temp 98.2 F (36.8 C)   Resp 16   Ht 5' 2.5" (1.588 m)   Wt 121 lb (54.9 kg)   SpO2 99%   BMI 21.78 kg/m  Assessment & Plan:   1. Fibrocystic breast changes of both breasts   2. Need for Zostavax administration     Meds ordered this encounter  Medications  . Zoster Vaccine Live, PF, (ZOSTAVAX) 60454 UNT/0.65ML injection    Sig: Inject 19,400 Units into the skin once.    Dispense:  1 vial    Refill:  0    I personally performed the services described in this documentation, which was scribed in my presence. The recorded information has been reviewed and considered, and addended by me as needed.   Delman Cheadle, M.D.  Primary Care at [facility] [address] [place], [place] [address] [phone] phone [phone] fax  07/03/16  11:32 PM

## 2016-08-23 ENCOUNTER — Encounter: Payer: Medicare Other | Admitting: Family Medicine

## 2016-09-17 ENCOUNTER — Ambulatory Visit (INDEPENDENT_AMBULATORY_CARE_PROVIDER_SITE_OTHER): Payer: Medicare Other | Admitting: Physician Assistant

## 2016-09-17 ENCOUNTER — Encounter: Payer: Self-pay | Admitting: Physician Assistant

## 2016-09-17 VITALS — BP 103/63 | HR 81 | Temp 98.4°F | Resp 17 | Ht 62.5 in | Wt 124.0 lb

## 2016-09-17 DIAGNOSIS — L0390 Cellulitis, unspecified: Secondary | ICD-10-CM

## 2016-09-17 MED ORDER — CEPHALEXIN 500 MG PO CAPS: 500.0000 mg | ORAL_CAPSULE | Freq: Four times a day (QID) | ORAL | 0 refills | Status: DC

## 2016-09-17 NOTE — Progress Notes (Deleted)
PRIMARY CARE AT [facility] [address], [place] [address] [phone]  Date:  09/17/2016   Name:  Emily [name]   DOB:  February 12, 1951   MRN:  [id number]  PCP:  Shawnee Knapp, MD    History of Present Illness:  Emily [name] is a 66 y.o. female patient who presents to PCP with  Chief Complaint  Patient presents with  . Edema    Left leg onset yesterday/ took train from [place] to here, have been standing alot/ needs anti itch cream and pills     Patient reports that she started to develop swelling red lesions along the lower extremity of her leg yesterday evening.  Within short period, she noted burning sensation along the leg.   She was visiting her mother in [place] at her home.  No don ot fydpnrs.    Patient Active Problem List   Diagnosis Date Noted  . Prediabetes 11/21/2012  . Breast fibrocystic disorder 11/21/2012  . Chest pain 11/21/2012  . Anxiety state, unspecified 11/21/2012  . Incidental lung nodule, > 19mm and < 66mm 02/15/2012    Past Medical History:  Diagnosis Date  . Anemia    iron runs low  . Anxiety   . Bronchitis   . Cataract   . History of chicken pox   . History of uterine fibroid   . Nontraumatic tear of skin    right side of vagina skin since 06-12-15, small amount of blood when wipes  . Tuberculosis    exposed as a young child    Past Surgical History:  Procedure Laterality Date  . COLONOSCOPY WITH PROPOFOL N/A 04/24/2016   Procedure: COLONOSCOPY WITH PROPOFOL;  Surgeon: Garlan Fair, MD;  Location: [facility];  Service: Endoscopy;  Laterality: N/A;  . EYE SURGERY Bilateral    lens replacements  . FRACTURE SURGERY Right 2003   fibula  . surgery for fibroids  2000    Social History  Substance Use Topics  . Smoking status: Never Smoker  . Smokeless tobacco: Never Used  . Alcohol use No    Family History  Problem Relation Age of Onset  . Diabetes Brother   . Hypertension Brother   . Alcohol abuse Brother   . Mental illness Brother    nervous breakdown  . Stroke Maternal Grandmother   . Depression Paternal Grandmother   . Arthritis Mother   . COPD Father 57    decsd due to lack of oxygen  . Hypertension Father   . Alcohol abuse Father   . Gout Father   . Alcohol abuse Brother   . Hypertension Brother     Allergies  Allergen Reactions  . Latex Itching and Rash    Medication list has been reviewed and updated.  Current Outpatient Prescriptions on File Prior to Visit  Medication Sig Dispense Refill  . aspirin EC 81 MG tablet Take 81 mg by mouth 2 (two) times daily as needed for mild pain.    . Calcium Citrate-Vitamin D (CALCIUM CITRATE + D PO) Take 1 tablet by mouth every morning.    . COD LIVER OIL PO Take 5 mLs by mouth daily.    . Cyanocobalamin (B-12 PO) Take 1 tablet by mouth daily.    . diphenhydrAMINE (BENADRYL) 25 MG tablet Take 25 mg by mouth every 6 (six) hours as needed for itching or allergies.    . Ferrous Sulfate (IRON) 142 (45 Fe) MG TBCR Take 142 mg by mouth daily.    Marland Kitchen  Glucosamine HCl (GLUCOSAMINE PO) Take 1 tablet by mouth daily.    . Multiple Vitamins-Minerals (PRESERVISION AREDS 2) CAPS Take 1 tablet by mouth daily.    Marland Kitchen Phenylephrine-APAP-Guaifenesin (MUCINEX FAST-MAX) 10-650-400 MG/20ML LIQD Take 1 Dose by mouth daily as needed (congestion).    Marland Kitchen VITAMIN E PO Take 1 tablet by mouth daily.     No current facility-administered medications on file prior to visit.     ROS ROS otherwise unremarkable unless listed above.  Physical Examination: BP 103/63 (BP Location: Right Arm, Patient Position: Sitting, Cuff Size: Normal)   Pulse 81   Temp 98.4 F (36.9 C) (Oral)   Resp 17   Ht 5' 2.5" (1.588 m)   Wt 124 lb (56.2 kg)   SpO2 98%   BMI 22.32 kg/m  Ideal Body Weight: Weight in (lb) to have BMI = 25: 138.6  Physical Exam   Assessment and Plan: Emily [name] is a 66 y.o. female who is here today  There are no diagnoses linked to this encounter.  Ivar Drape, PA-C [facility] 09/17/2016 12:06 PM

## 2016-09-17 NOTE — Progress Notes (Signed)
PRIMARY CARE AT [facility] [address] [place] [address] [phone]  Date:  09/17/2016   Name:  Emily [name]   DOB:  04/07/1951   MRN:  [id number]  PCP:  Shawnee Knapp, MD    History of Present Illness:  Emily [name] is a 66 y.o. female patient who presents to PCP with  Chief Complaint  Patient presents with  . Edema    Left leg onset yesterday/ took train from [place] to here, have been standing alot/ needs anti itch cream and pills     Patient reports leg swellings that started yesterday She reports that they are red bumps that are warm to the touch and heavily pruritic Started at her lower left leg, but then noticed them arising along her thigh.   She has noticed mild swelling of her lower leg.  She was on a train from [place] yesterday. No fever, sob, or dyspnea.   She has no hx of blood clots.  Visiting mother at her home.  No pets.   Patient Active Problem List   Diagnosis Date Noted  . Prediabetes 11/21/2012  . Breast fibrocystic disorder 11/21/2012  . Chest pain 11/21/2012  . Anxiety state, unspecified 11/21/2012  . Incidental lung nodule, > 33mm and < 83mm 02/15/2012    Past Medical History:  Diagnosis Date  . Anemia    iron runs low  . Anxiety   . Bronchitis   . Cataract   . History of chicken pox   . History of uterine fibroid   . Nontraumatic tear of skin    right side of vagina skin since 06-12-15, small amount of blood when wipes  . Tuberculosis    exposed as a young child    Past Surgical History:  Procedure Laterality Date  . COLONOSCOPY WITH PROPOFOL N/A 04/24/2016   Procedure: COLONOSCOPY WITH PROPOFOL;  Surgeon: Garlan Fair, MD;  Location: [facility];  Service: Endoscopy;  Laterality: N/A;  . EYE SURGERY Bilateral    lens replacements  . FRACTURE SURGERY Right 2003   fibula  . surgery for fibroids  2000    Social History  Substance Use Topics  . Smoking status: Never Smoker  . Smokeless tobacco: Never Used  . Alcohol use No    Family  History  Problem Relation Age of Onset  . Diabetes Brother   . Hypertension Brother   . Alcohol abuse Brother   . Mental illness Brother     nervous breakdown  . Stroke Maternal Grandmother   . Depression Paternal Grandmother   . Arthritis Mother   . COPD Father 43    decsd due to lack of oxygen  . Hypertension Father   . Alcohol abuse Father   . Gout Father   . Alcohol abuse Brother   . Hypertension Brother     Allergies  Allergen Reactions  . Latex Itching and Rash    Medication list has been reviewed and updated.  Current Outpatient Prescriptions on File Prior to Visit  Medication Sig Dispense Refill  . aspirin EC 81 MG tablet Take 81 mg by mouth 2 (two) times daily as needed for mild pain.    . Calcium Citrate-Vitamin D (CALCIUM CITRATE + D PO) Take 1 tablet by mouth every morning.    . COD LIVER OIL PO Take 5 mLs by mouth daily.    . Cyanocobalamin (B-12 PO) Take 1 tablet by mouth daily.    . diphenhydrAMINE (BENADRYL) 25 MG tablet Take 25  mg by mouth every 6 (six) hours as needed for itching or allergies.    . Ferrous Sulfate (IRON) 142 (45 Fe) MG TBCR Take 142 mg by mouth daily.    . Glucosamine HCl (GLUCOSAMINE PO) Take 1 tablet by mouth daily.    . Multiple Vitamins-Minerals (PRESERVISION AREDS 2) CAPS Take 1 tablet by mouth daily.    Marland Kitchen Phenylephrine-APAP-Guaifenesin (MUCINEX FAST-MAX) 10-650-400 MG/20ML LIQD Take 1 Dose by mouth daily as needed (congestion).    Marland Kitchen VITAMIN E PO Take 1 tablet by mouth daily.     No current facility-administered medications on file prior to visit.     ROS ROS otherwise unremarkable unless listed above.  Physical Examination: BP 103/63 (BP Location: Right Arm, Patient Position: Sitting, Cuff Size: Normal)   Pulse 81   Temp 98.4 F (36.9 C) (Oral)   Resp 17   Ht 5' 2.5" (1.588 m)   Wt 124 lb (56.2 kg)   SpO2 98%   BMI 22.32 kg/m  Ideal Body Weight: Weight in (lb) to have BMI = 25: 138.6  Physical Exam  Constitutional:  She is oriented to person, place, and time. She appears well-developed and well-nourished. No distress.  HENT:  Head: Normocephalic and atraumatic.  Right Ear: External ear normal.  Left Ear: External ear normal.  Eyes: Conjunctivae and EOM are normal. Pupils are equal, round, and reactive to light.  Cardiovascular: Normal rate.   Pulses:      Dorsalis pedis pulses are 2+ on the right side, and 2+ on the left side.  Pulmonary/Chest: Effort normal. No respiratory distress.  Neurological: She is alert and oriented to person, place, and time.  Skin: She is not diaphoretic.  Erythematous subdermal raised nodularity that is non-tender along the medial of the lower left leg.  There is also 2 on the medial thigh.    Psychiatric: She has a normal mood and affect. Her behavior is normal.     Assessment and Plan: [name] is a 66 y.o. female who is here today for rash of skin This appears to be possible bites with secondary cellulitis Cellulitis, unspecified cellulitis site - Plan: cephALEXin (KEFLEX) 500 MG capsule  Ivar Drape, PA-C [facility] 5/9/20188:10 AM

## 2016-09-17 NOTE — Patient Instructions (Addendum)
You can use warm compresses in the area.  And take tylenol for your leg pain.    Cellulitis, Adult Cellulitis is a skin infection. The infected area is usually red and sore. This condition occurs most often in the arms and lower legs. It is very important to get treated for this condition. Follow these instructions at home:  Take over-the-counter and prescription medicines only as told by your doctor.  If you were prescribed an antibiotic medicine, take it as told by your doctor. Do not stop taking the antibiotic even if you start to feel better.  Drink enough fluid to keep your pee (urine) clear or pale yellow.  Do not touch or rub the infected area.  Raise (elevate) the infected area above the level of your heart while you are sitting or lying down.  Place warm or cold wet cloths (warm or cold compresses) on the infected area. Do this as told by your doctor.  Keep all follow-up visits as told by your doctor. This is important. These visits let your doctor make sure your infection is not getting worse. Contact a doctor if:  You have a fever.  Your symptoms do not get better after 1-2 days of treatment.  Your bone or joint under the infected area starts to hurt after the skin has healed.  Your infection comes back. This can happen in the same area or another area.  You have a swollen bump in the infected area.  You have new symptoms.  You feel ill and also have muscle aches and pains. Get help right away if:  Your symptoms get worse.  You feel very sleepy.  You throw up (vomit) or have watery poop (diarrhea) for a long time.  There are red streaks coming from the infected area.  Your red area gets larger.  Your red area turns darker. This information is not intended to replace advice given to you by your health care provider. Make sure you discuss any questions you have with your health care provider. Document Released: 10/17/2007 Document Revised: 10/06/2015 Document  Reviewed: 03/09/2015 Elsevier Interactive Patient Education  2017 [employer].     IF you received an x-ray today, you will receive an invoice from [facility]. Please contact [facility] at [phone] with questions or concerns regarding your invoice.   IF you received labwork today, you will receive an invoice from [place]. Please contact [facility] at [phone] with questions or concerns regarding your invoice.   Our billing staff will not be able to assist you with questions regarding bills from these companies.  You will be contacted with the lab results as soon as they are available. The fastest way to get your results is to activate your My Chart account. Instructions are located on the last page of this paperwork. If you have not heard from [place] regarding the results in 2 weeks, please contact this office.
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
## 2016-09-24 ENCOUNTER — Ambulatory Visit (INDEPENDENT_AMBULATORY_CARE_PROVIDER_SITE_OTHER): Payer: Medicare Other | Admitting: Family Medicine

## 2016-09-24 ENCOUNTER — Encounter: Payer: Self-pay | Admitting: Family Medicine

## 2016-09-24 VITALS — BP 104/69 | HR 88 | Temp 97.9°F | Resp 18 | Ht 62.36 in | Wt 122.8 lb

## 2016-09-24 DIAGNOSIS — R911 Solitary pulmonary nodule: Secondary | ICD-10-CM | POA: Diagnosis not present

## 2016-09-24 DIAGNOSIS — R7303 Prediabetes: Secondary | ICD-10-CM

## 2016-09-24 DIAGNOSIS — L0390 Cellulitis, unspecified: Secondary | ICD-10-CM

## 2016-09-24 LAB — COMPREHENSIVE METABOLIC PANEL
ALT: 26 IU/L (ref 0–32)
AST: 25 IU/L (ref 0–40)
Albumin/Globulin Ratio: 1.6 (ref 1.2–2.2)
Albumin: 4.2 g/dL (ref 3.6–4.8)
Alkaline Phosphatase: 58 IU/L (ref 39–117)
BUN/Creatinine Ratio: 30 — ABNORMAL HIGH (ref 12–28)
BUN: 21 mg/dL (ref 8–27)
Bilirubin Total: 0.4 mg/dL (ref 0.0–1.2)
CALCIUM: 8.8 mg/dL (ref 8.7–10.3)
CO2: 26 mmol/L (ref 18–29)
CREATININE: 0.69 mg/dL (ref 0.57–1.00)
Chloride: 103 mmol/L (ref 96–106)
GFR calc Af Amer: 104 mL/min/{1.73_m2} (ref >59)
GFR, EST NON AFRICAN AMERICAN: 90 mL/min/{1.73_m2} (ref >59)
GLUCOSE: 97 mg/dL (ref 65–99)
Globulin, Total: 2.6 g/dL (ref 1.5–4.5)
Potassium: 4 mmol/L (ref 3.5–5.2)
Sodium: 139 mmol/L (ref 134–144)
Total Protein: 6.8 g/dL (ref 6.0–8.5)

## 2016-09-24 LAB — POCT GLYCOSYLATED HEMOGLOBIN (HGB A1C): HEMOGLOBIN A1C: 5.9

## 2016-09-24 MED ORDER — CEPHALEXIN 500 MG PO CAPS: 500.0000 mg | ORAL_CAPSULE | Freq: Four times a day (QID) | ORAL | 0 refills | Status: AC

## 2016-09-24 NOTE — Patient Instructions (Signed)
     IF you received an x-ray today, you will receive an invoice from [facility]. Please contact [facility] at [phone] with questions or concerns regarding your invoice.   IF you received labwork today, you will receive an invoice from [facility]. Please contact [facility] at [phone] with questions or concerns regarding your invoice.   Our billing staff will not be able to assist you with questions regarding bills from these companies.  You will be contacted with the lab results as soon as they are available. The fastest way to get your results is to activate your My Chart account. Instructions are located on the last page of this paperwork. If you have not heard from us regarding the results in 2 weeks, please contact this office.     

## 2016-09-28 ENCOUNTER — Encounter: Payer: Self-pay | Admitting: Radiology

## 2016-10-02 ENCOUNTER — Telehealth: Payer: Self-pay | Admitting: Family Medicine

## 2016-10-02 NOTE — Telephone Encounter (Signed)
LMOM FOR PT TO GIVE [place] A CALL BACK PT WAS JUST IN LAST WEEK TO SEE DR SHAW FOR HER 6 MONTH F/U  SO SHE CAN CANCELL APPT THAT SHE HAS WITH SHAW ON Thursday 10-04-16 UNLESS SHE HAS DEVELOPED AN ACUTE ISSUE

## 2016-10-04 ENCOUNTER — Encounter: Payer: Medicare Other | Admitting: Family Medicine

## 2016-10-04 NOTE — Progress Notes (Signed)
   Subjective:    Patient ID: Emily [name], female    DOB: [birth year], 66 y.o.   MRN: [id number]  HPI    Review of Systems     Objective:   Physical Exam        Assessment & Plan:    This encounter was created in error - please disregard.

## 2016-11-07 ENCOUNTER — Telehealth: Payer: Self-pay | Admitting: Family Medicine

## 2016-11-07 NOTE — Telephone Encounter (Signed)
Please advise 

## 2016-11-07 NOTE — Telephone Encounter (Signed)
I have admin time on Friday afternoon so I will try to call her then.
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
## 2016-11-07 NOTE — Telephone Encounter (Signed)
Need visit to eval cellulitis. In the meantime, wet warm heat x 5 min every few hrs followed by topical antibiotic ointment.  Please call pt back and get more info then will prob need to get her to sched an appt.  But if pt wants to adopt the infant, or do foster care, I do not see any medical contraindications to her doing this. I am fine w/ providing medical clearance.

## 2016-11-07 NOTE — Telephone Encounter (Signed)
Pt stated that she would like you to call her directly. What she explained to the people on the phone was not what she was calling for and it was for something completely different. I explained to the pt that she needed to be evaluated at the office and she stated that she didn't have a problem and didn't need to see anyone. I advised that Dr. Brigitte Pulse seen no problems medical that would prevent her from adopting/fostering her grandson. If you could please call this patient as she wants to speak to you directly.

## 2016-11-09 NOTE — Telephone Encounter (Signed)
Called pt- No answer, LVM

## 2016-11-10 NOTE — Telephone Encounter (Signed)
Called again and LVM. Will try again on Tues 7/3 - asked pt to call on Mon 7/2 and leave message with what time she would be free for [place] to talk on Tues 7/3.

## 2016-11-12 ENCOUNTER — Telehealth: Payer: Self-pay | Admitting: Family Medicine

## 2016-11-12 NOTE — Telephone Encounter (Signed)
DR SHAW PT CALLING YOU BACK SHE STATING THAT SHE IS OFF ALL THIS WEEK AND SHE WILL HAVE PHONE ON HER SHE STATES THAT HER DAUGHTER IS LEAVING TOMORROW AND A CALL BACK TOMORROW WOULD BE TO LATE PLEASE CALL WHEN YOU CAN

## 2016-11-12 NOTE — Telephone Encounter (Signed)
Please see this
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
## 2016-12-15 ENCOUNTER — Ambulatory Visit (INDEPENDENT_AMBULATORY_CARE_PROVIDER_SITE_OTHER): Payer: Medicare Other

## 2016-12-15 ENCOUNTER — Ambulatory Visit (INDEPENDENT_AMBULATORY_CARE_PROVIDER_SITE_OTHER): Payer: Medicare Other | Admitting: Family Medicine

## 2016-12-15 ENCOUNTER — Encounter: Payer: Self-pay | Admitting: Family Medicine

## 2016-12-15 VITALS — BP 107/66 | HR 74 | Temp 98.4°F | Resp 17 | Ht 62.0 in | Wt 122.1 lb

## 2016-12-15 DIAGNOSIS — G8929 Other chronic pain: Secondary | ICD-10-CM

## 2016-12-15 DIAGNOSIS — M629 Disorder of muscle, unspecified: Secondary | ICD-10-CM | POA: Diagnosis not present

## 2016-12-15 DIAGNOSIS — M7632 Iliotibial band syndrome, left leg: Secondary | ICD-10-CM

## 2016-12-15 DIAGNOSIS — M5410 Radiculopathy, site unspecified: Secondary | ICD-10-CM | POA: Diagnosis not present

## 2016-12-15 DIAGNOSIS — M25552 Pain in left hip: Secondary | ICD-10-CM

## 2016-12-15 DIAGNOSIS — M5136 Other intervertebral disc degeneration, lumbar region: Secondary | ICD-10-CM

## 2016-12-15 DIAGNOSIS — M545 Low back pain: Secondary | ICD-10-CM | POA: Diagnosis not present

## 2016-12-15 DIAGNOSIS — M6289 Other specified disorders of muscle: Secondary | ICD-10-CM

## 2016-12-15 DIAGNOSIS — M51369 Other intervertebral disc degeneration, lumbar region without mention of lumbar back pain or lower extremity pain: Secondary | ICD-10-CM

## 2016-12-15 NOTE — Patient Instructions (Addendum)
Apply ice to your left hip three times a day and try some of the below exercises.    IF you received an x-ray today, you will receive an invoice from [facility]. Please contact [facility] at [phone] with questions or concerns regarding your invoice.   IF you received labwork today, you will receive an invoice from [facility]. Please contact [facility] at [phone] with questions or concerns regarding your invoice.   Our billing staff will not be able to assist you with questions regarding bills from these companies.  You will be contacted with the lab results as soon as they are available. The fastest way to get your results is to activate your My Chart account. Instructions are located on the last page of this paperwork. If you have not heard from [place] regarding the results in 2 weeks, please contact this office.     Iliotibial Band Syndrome Iliotibial band syndrome (ITBS) is a condition that often causes knee pain. It can also cause pain in the outside of your hip, thigh, and knee. The iliotibial band is a strip of tissue that runs from the outside of your hip and down your thigh to the outside of your knee. Repeatedly bending and straightening your knee can irritate the iliotibial band. What are the causes? This condition is caused by inflammation and irritation from the friction of the iliotibial band moving over the thigh bone (femur) when you repeatedly bend and straighten your knee. What increases the risk? This condition is more likely to develop in people who:  Frequently change elevation during their workouts.  Run very long distances.  Recently increased the length or intensity of their workouts.  Run downhill often, or just started running downhill.  Ride a bike very far or often.  You may also be at greater risk if you start a new workout routine without first warming up or if you have a job that requires you to bend, squat, or climb  frequently. What are the signs or symptoms? Symptoms of this condition include:  Pain along the outside of your knee that may be worse with activity, especially running or going up and down stairs.  A "snapping" sensation over your knee.  Swelling on the outside of your knee.  Pain or a feeling of tightness in your hip.  How is this diagnosed? This condition is diagnosed based on your symptoms, medical history, and physical exam. You may also see a health care provider who specializes in reducing pain and increasing mobility (physical therapist). A physical therapist may do an exam to check your balance, movement, and way of walking or running (gait) to see whether the way you move could contribute to your injury. You may also have tests to measure your strength, flexibility, and range of motion. How is this treated? Treatment for this condition includes:  Resting and limiting exercise.  Returning to activities gradually.  Doing range-of-motion and strengthening exercises (physical therapy) as told by your health care provider.  Including low-impact activities, such as swimming, in your exercise routine.  Follow these instructions at home:  If directed, apply ice to the injured area. ? Put ice in a plastic bag. ? Place a towel between your skin and the bag. ? Leave the ice on for 20 minutes, 2-3 times per day.  Return to your normal activities as told by your health care provider. Ask your health care provider what activities are safe for you.  Keep all follow-up visits with your health care  provider. This is important. Contact a health care provider if:  Your pain does not improve or gets worse despite treatment. This information is not intended to replace advice given to you by your health care provider. Make sure you discuss any questions you have with your health care provider. Document Released: 10/20/2001 Document Revised: 06/01/2016 Document Reviewed:  06/01/2016 Elsevier Interactive Patient Education  2018 [place] [facility] Syndrome [facility] Ask your health care provider which exercises are safe for you. Do exercises exactly as told by your health care provider and adjust them as directed. It is normal to feel mild stretching, pulling, tightness, or discomfort as you do these exercises, but you should stop right away if you feel sudden pain or your pain gets worse.Do not begin these exercises until told by your health care provider. Stretching and range of motion exercises These exercises warm up your muscles and joints and improve the movement and flexibility of your hip and pelvis. Exercise A: Quadriceps, prone  1. Lie on your abdomen on a firm surface, such as a bed or padded floor. 2. Bend your left / right knee and hold your ankle. If you cannot reach your ankle or pant leg, loop a belt around your foot and grab the belt instead. 3. Gently pull your heel toward your buttocks. Your knee should not slide out to the side. You should feel a stretch in the front of your thigh and knee. 4. Hold this position for __________ seconds. Repeat __________ times. Complete this stretch __________ times a day. Exercise B: Iliotibial band  1. Lie on your side with your left / right leg in the top position. 2. Bend both of your knees and grab your left / right ankle. Stretch out your bottom arm to help you balance. 3. Slowly bring your top knee back so your thigh goes behind your trunk. 4. Slowly lower your top leg toward the floor until you feel a gentle stretch on the outside of your left / right hip and thigh. If you do not feel a stretch and your knee will not fall farther, place the heel of your other foot on top of your knee and pull your knee down toward the floor with your foot. 5. Hold this position for __________ seconds. Repeat __________ times. Complete this stretch __________ times a day. Strengthening exercises These  exercises build strength and endurance in your hip and pelvis. Endurance is the ability to use your muscles for a long time, even after they get tired. Exercise C: Straight leg raises ( hip abductors) 1. Lie on your side with your left / right leg in the top position. Lie so your head, shoulder, knee, and hip line up. You may bend your bottom knee to help you balance. 2. Roll your hips slightly forward so your hips are stacked directly over each other and your left / right knee is facing forward. 3. Tense the muscles in your outer thigh and lift your top leg 4-6 inches (10-15 cm). 4. Hold this position for __________ seconds. 5. Slowly return to the starting position. Let your muscles relax completely before doing another repetition. Repeat __________ times. Complete this exercise __________ times a day. Exercise D: Straight leg raises ( hip extensors) 1. Lie on your abdomen on your bed or a firm surface. You can put a pillow under your hips if that is more comfortable. 2. Bend your left / right knee so your foot is straight up in the air.  3. Squeeze your buttock muscles and lift your left / right thigh off the bed. Do not let your back arch. 4. Tense this muscle as hard as you can without increasing any knee pain. 5. Hold this position for __________ seconds. 6. Slowly lower your leg to the starting position and allow it to relax completely. Repeat __________ times. Complete this exercise __________ times a day. Exercise E: Hip hike 1. Stand sideways on a bottom step. Stand on your left / right leg with your other foot unsupported next to the step. You can hold onto the railing or wall if needed for balance. 2. Keep your knees straight and your torso square. Then, lift your left / right hip up toward the ceiling. 3. Slowly let your left / right hip lower toward the floor, past the starting position. Your foot should get closer to the floor. Do not lean or bend your knees. Repeat __________  times. Complete this exercise __________ times a day. This information is not intended to replace advice given to you by your health care provider. Make sure you discuss any questions you have with your health care provider. Document Released: 04/30/2005 Document Revised: 01/03/2016 Document Reviewed: 04/01/2015 Elsevier Interactive Patient Education  [employer].
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
## 2017-02-16 ENCOUNTER — Ambulatory Visit (INDEPENDENT_AMBULATORY_CARE_PROVIDER_SITE_OTHER): Payer: Medicare Other | Admitting: Family Medicine

## 2017-02-16 ENCOUNTER — Encounter: Payer: Self-pay | Admitting: Family Medicine

## 2017-02-16 VITALS — BP 84/60 | HR 91 | Temp 97.7°F | Resp 16 | Ht 62.0 in | Wt 121.6 lb

## 2017-02-16 DIAGNOSIS — N6019 Diffuse cystic mastopathy of unspecified breast: Secondary | ICD-10-CM

## 2017-02-16 DIAGNOSIS — Z780 Asymptomatic menopausal state: Secondary | ICD-10-CM | POA: Diagnosis not present

## 2017-02-16 DIAGNOSIS — Z1231 Encounter for screening mammogram for malignant neoplasm of breast: Secondary | ICD-10-CM | POA: Diagnosis not present

## 2017-02-16 DIAGNOSIS — N6310 Unspecified lump in the right breast, unspecified quadrant: Secondary | ICD-10-CM

## 2017-02-16 DIAGNOSIS — N6031 Fibrosclerosis of right breast: Secondary | ICD-10-CM | POA: Diagnosis not present

## 2017-02-16 DIAGNOSIS — Z23 Encounter for immunization: Secondary | ICD-10-CM | POA: Diagnosis not present

## 2017-02-16 DIAGNOSIS — Z1239 Encounter for other screening for malignant neoplasm of breast: Secondary | ICD-10-CM

## 2017-02-16 NOTE — Patient Instructions (Addendum)
You need to schedule your mammogram AND your DEXA bone scan.  Please call [place] at [phone] to schedule BOTH of these for [facility] January. Come to see me for a repeat breast exam the end of December or very beginning of January so we can change the mammogram to include extra-images and an ultrasound if we still have any concerns.    IF you received an x-ray today, you will receive an invoice from [facility]. Please contact [facility] at [phone] with questions or concerns regarding your invoice.   IF you received labwork today, you will receive an invoice from [place]. Please contact [facility] at [phone] with questions or concerns regarding your invoice.   Our billing staff will not be able to assist you with questions regarding bills from these companies.  You will be contacted with the lab results as soon as they are available. The fastest way to get your results is to activate your My Chart account. Instructions are located on the last page of this paperwork. If you have not heard from [place] regarding the results in 2 weeks, please contact this office.      Fibrocystic Breast Changes Fibrocystic breast changes are changes in breast tissue that can cause breasts to become swollen, lumpy, or painful. This can happen due to buildup of scar-like tissue (fibrous tissue) or the forming of fluid-filled lumps (cysts) in the breast. This is a common condition, and it is not cancerous (is benign). The exact cause is not known, but it seems to occur when women go through hormonal changes during their menstrual cycle. Fibrocystic breast changes can affect one or both breasts. What are the causes? The exact cause of fibrocystic breast changes is not known. However, this condition:  May be related to the female hormones estrogen and progesterone.  May be influenced by family traits that get passed from parent to child (genetics).  What are the  signs or symptoms? Symptoms of this condition may affect one or both breasts, and may include:  Tenderness, mild discomfort, or pain.  Swelling.  Rope-like tissue that can be felt when touching the breast.  Lumps in one or both breasts.  Changes in breast size. Breasts may get larger before the menstrual period and smaller after the menstrual period.  Green or dark brown discharge from the nipple.  Symptoms are usually worse before menstrual periods start, and they get better toward the end of menstrual periods. How is this diagnosed? This condition is diagnosed based on your medical history and a physical exam of your breasts. You may also have tests, such as:  A breast X-ray (mammogram).  Ultrasound of your breasts.  MRI.  Removal of a breast tissue sample for testing (breast biopsy). This may be done if your health care provider thinks that something else may be causing changes in your breasts.  How is this treated? Often, treatment is not needed for this condition. In some cases, treatment may include:  Taking over-the-counter pain relievers to help lessen pain or discomfort.  Limiting or avoiding caffeine. Foods and beverages that contain caffeine include chocolate, soda, coffee, and tea.  Reducing sugar and fat in your diet.  Your health care provider may also recommend:  A procedure to remove fluid from a cyst that is causing pain (fine needle aspiration).  Surgery to remove a cyst that is large or tender or does not go away.  Follow these instructions at home:  Examine your breasts after every menstrual  period. If you do not have menstrual periods, check your breasts on the first day of every month. Feel for changes in your breasts, such as: ? More tenderness. ? A new growth. ? A change in size. ? A change in an existing lump.  Take over-the-counter and prescription medicines only as told by your health care provider.  Wear a well-fitted support or sports  bra, especially when exercising.  Decrease or avoid caffeine, fat, and sugar in your diet as directed by your health care provider. Contact a health care provider if:  You have fluid leaking from your nipple, especially if it is bloody.  You have new lumps or bumps in your breast.  Your breast becomes enlarged, red, and painful.  You have areas of your breast that pucker inward.  Your nipple appears flat or indented. Get help right away if:  You have redness of your breast and the redness is spreading. Summary  Fibrocystic breast changes are changes in breast tissue that can cause breasts to become swollen, lumpy, or painful.  This condition may be related to the female hormones estrogen and progesterone.  With this condition, it is important to examine your breasts after every menstrual period. If you do not have menstrual periods, check your breasts on the first day of every month. This information is not intended to replace advice given to you by your health care provider. Make sure you discuss any questions you have with your health care provider. Document Released: 02/14/2006 Document Revised: 01/10/2016 Document Reviewed: 12/28/2015 Elsevier Interactive Patient Education  2017 [employer].

## 2017-02-16 NOTE — Progress Notes (Signed)
Subjective:  By signing my name below, I, Emily [name], attest that this documentation has been prepared under the direction and in the presence of Delman Cheadle, MD. Electronically Signed: Moises [name], [place]. 02/16/2017 , 12:06 PM .  Patient was seen in Room 3 .   Patient ID: Emily [name], female    DOB: June 15, 1950, 66 y.o.   MRN: [id number] Chief Complaint  Patient presents with  . Breast Mass   HPI Emily [name] is a 66 y.o. female who presents to Primary Care at [facility] complaining of feeling a mass in her right breast. She had a biopsy of upper outer quadrant of her right breast, which showed dense stromal fibrosis; no additional evaluation needed. She describes initially feeing "something rough" over her right breast around area of biopsy. She's been feeling lumps and bumps in the area when applying lotion recently. She denies any breast pain. She notes left breast unaffected. She denies rash or nipple drainage. She denies increase in caffeine; however, she's been drinking more Coca Cola recently. She takes Vitamin D, Vitamin E, magnesium, and calcium; she also takes iron supplement intermittently. She received flu shot today.   Past Medical History:  Diagnosis Date  . Anemia    iron runs low  . Anxiety   . Bronchitis   . Cataract   . History of chicken pox   . History of uterine fibroid   . Nontraumatic tear of skin    right side of vagina skin since 06-12-15, small amount of [name] when wipes  . Tuberculosis    exposed as a young child   Prior to Admission medications   Medication Sig Start Date End Date Taking? Authorizing Provider  COD LIVER OIL PO Take by mouth.   Yes [provider]  Ferrous Sulfate (IRON) 142 (45 Fe) MG TBCR Take 142 mg by mouth daily.   Yes [provider]  glucosamine-chondroitin 500-400 MG tablet Take 1 tablet by mouth 3 (three) times daily.   Yes [provider]   Allergies  Allergen Reactions  . Latex Itching and Rash    Past Surgical History:  Procedure Laterality Date  . COLONOSCOPY WITH PROPOFOL N/A 04/24/2016   Procedure: COLONOSCOPY WITH PROPOFOL;  Surgeon: Garlan Fair, MD;  Location: [facility];  Service: Endoscopy;  Laterality: N/A;  . EYE SURGERY Bilateral    lens replacements  . FRACTURE SURGERY Right 2003   fibula  . surgery for fibroids  2000   Social History   Social History  . Marital status: Divorced    Spouse name: N/A  . Number of children: N/A  . Years of education: N/A   Social History Main Topics  . Smoking status: Never Smoker  . Smokeless tobacco: Never Used  . Alcohol use No  . Drug use: No  . Sexual activity: No   Other Topics Concern  . None   Social History Narrative  . None   Family History  Problem Relation Age of Onset  . Diabetes Brother   . Hypertension Brother   . Alcohol abuse Brother   . Mental illness Brother        nervous breakdown  . Stroke Maternal Grandmother   . Depression Paternal Grandmother   . Arthritis Mother   . COPD Father 28       decsd due to lack of oxygen  . Hypertension Father   . Alcohol abuse Father   . Gout Father   . Alcohol abuse Brother   .  Hypertension Brother    Depression screen [facility] 2/9 02/16/2017 12/15/2016 09/24/2016 09/17/2016 06/16/2016  Decreased Interest 0 0 0 0 0  Down, Depressed, Hopeless 0 0 0 0 0  PHQ - 2 Score 0 0 0 0 0    Review of Systems  Constitutional: Negative for chills, fatigue, fever and unexpected weight change.  Respiratory: Negative for cough.   Gastrointestinal: Negative for constipation, diarrhea, nausea and vomiting.  Skin: Negative for color change, pallor, rash and wound.  Neurological: Negative for dizziness, weakness and headaches.       Objective:   Physical Exam  Constitutional: She is oriented to person, place, and time. She appears well-developed and well-nourished. No distress.  HENT:  Head: Normocephalic and atraumatic.  Eyes: Pupils are equal, round, and reactive to  light. EOM are normal.  Neck: Neck supple.  Cardiovascular: Normal rate.   Pulmonary/Chest: Effort normal. No respiratory distress.  Right breast: 5cm length x 3cm width well defined, firm mobile mass beginning at exactly 12 o'clock immediately outside of her areola on the inner lower quadrant of the mass, and extending laterally more than superiorly; on lateral edge, there were 2 pea-sized firm masses similar in texture to the larger with the lower being slightly larger and more poorly defined than upper Left breast normal No axillary adenopathy; slight palpable thickening of medial breast actually due to ribs  Musculoskeletal: Normal range of motion.  Lymphadenopathy:    She has no axillary adenopathy.  Neurological: She is alert and oriented to person, place, and time.  Skin: Skin is warm and dry.  Psychiatric: She has a normal mood and affect. Her behavior is normal.  Nursing note and vitals reviewed.   BP (!) 84/60   Pulse 91   Temp 97.7 F (36.5 C) (Oral)   Resp 16   Ht 5\' 2"  (1.575 m)   Wt 121 lb 9.6 oz (55.2 kg)   SpO2 97%   BMI 22.24 kg/m      Assessment & Plan:   1. Breast mass, right - Pt and I both feel confident that this mass is that same place where she had the biopsy earlier this year. It is certainly larger than pre-biopsy but to me it would make sense that since it was fibrosis - the inflammation from the biopsy and clip pt could have caused it to enlarge - like scar tissue.  No other alarm sxs. Pt will be due for repeat mammogram in 3 mos - rec RTC for recheck w/ me sev wks prior to mammogram to ensure that this area has not grown or changed at all as if still increasing in size, will want to change mammogram to diagnostic w/ [place]. RTC sooner if any changes or alarm sxs.  2. Need for prophylactic vaccination and inoculation against influenza   3. Postmenopausal estrogen deficiency   4. Screening for breast cancer   5. Fibrosis, breast, right   6. Fibrocystic breast  disease (FCBD), unspecified laterality     Orders Placed This Encounter  Procedures  . Flu Vaccine QUAD 36+ mos IM    I personally performed the services described in this documentation, which was scribed in my presence. The recorded information has been reviewed and considered, and addended by me as needed.   Delman Cheadle, M.D.  Primary Care at [facility] [address] [place], [place] [address] [phone] phone [phone] fax  02/19/17 9:31 AM

## 2017-02-22 ENCOUNTER — Ambulatory Visit: Payer: Medicare Other | Admitting: Family Medicine

## 2017-03-13 ENCOUNTER — Other Ambulatory Visit: Payer: Self-pay | Admitting: Family Medicine

## 2017-03-13 ENCOUNTER — Telehealth: Payer: Self-pay

## 2017-03-13 DIAGNOSIS — Z139 Encounter for screening, unspecified: Secondary | ICD-10-CM

## 2017-03-13 NOTE — Telephone Encounter (Signed)
Called pt to schedule Medicare Annual Wellness Visit. -nr  

## 2017-03-22 ENCOUNTER — Telehealth: Payer: Self-pay

## 2017-03-22 NOTE — Telephone Encounter (Signed)
Need to reschedule AWV for sometime after 03/30/17 due to medicare rules. It can be on the same day as her other appointment if that works out. CB number is below for questions or to schedule if it's easier to have patient call me.    Josepha Pigg, B.A.  Care Guide - Primary Care at [place]

## 2017-03-25 ENCOUNTER — Ambulatory Visit: Payer: Medicare Other

## 2017-03-26 DIAGNOSIS — H353131 Nonexudative age-related macular degeneration, bilateral, early dry stage: Secondary | ICD-10-CM | POA: Diagnosis not present

## 2017-03-26 DIAGNOSIS — H35413 Lattice degeneration of retina, bilateral: Secondary | ICD-10-CM | POA: Diagnosis not present

## 2017-03-26 DIAGNOSIS — Z961 Presence of intraocular lens: Secondary | ICD-10-CM | POA: Diagnosis not present

## 2017-03-26 LAB — HM DIABETES EYE EXAM

## 2017-04-01 ENCOUNTER — Ambulatory Visit: Payer: Medicare Other | Admitting: Family Medicine

## 2017-04-03 ENCOUNTER — Ambulatory Visit: Payer: Medicare Other | Admitting: Family Medicine

## 2017-04-15 ENCOUNTER — Encounter: Payer: Self-pay | Admitting: Podiatry

## 2017-04-15 ENCOUNTER — Ambulatory Visit (INDEPENDENT_AMBULATORY_CARE_PROVIDER_SITE_OTHER): Payer: Medicare Other | Admitting: Podiatry

## 2017-04-15 DIAGNOSIS — B079 Viral wart, unspecified: Secondary | ICD-10-CM | POA: Diagnosis not present

## 2017-04-15 DIAGNOSIS — M216X9 Other acquired deformities of unspecified foot: Secondary | ICD-10-CM | POA: Diagnosis not present

## 2017-04-15 NOTE — Progress Notes (Signed)
   Subjective:    Patient ID: Emily [name], female    DOB: 29-Jun-1950, 66 y.o.   MRN: [id number]  HPI    Review of Systems  All other systems reviewed and are negative.      Objective:   Physical Exam        Assessment & Plan:

## 2017-04-15 NOTE — Progress Notes (Signed)
Subjective:   Patient ID: Emily [name], female   DOB: 66 y.o.   MRN: [id number]   HPI Patient presents with a lot of pain underneath the right foot with lesion formation that's making it hard to walk. States it's been present for a number of months and she's tried Vicks vapo rub without any relief of symptoms   Review of Systems  All other systems reviewed and are negative.       Objective:  Physical Exam  Constitutional: She appears well-developed and well-nourished.  Cardiovascular: Intact distal pulses.  Pulmonary/Chest: Effort normal.  Musculoskeletal: Normal range of motion.  Neurological: She is alert.  Skin: Skin is warm.  Nursing note and vitals reviewed.   Patient states she does not smoke and would like to be more active. Patient presents with hyperkeratotic lesion sub-third metatarsal right upon debridement shows pinpoint bleeding with pain to lateral pressure and also discomfort in general with the foot. Patient states it's gotten gradually worse over the last few months     Assessment:  Probability for verruca plantaris versus plantarflexed metatarsal with keratotic lesion     Plan:  H&P conditions reviewed and at this point deep debridement of lesion accomplished with application of medication to reduce probable wart tissue formation and sterile dressing. Patient was instructed what to do any blistering were to occur and we'll reappoint to recheck

## 2017-04-22 ENCOUNTER — Ambulatory Visit: Payer: Medicare Other

## 2017-04-22 ENCOUNTER — Ambulatory Visit: Payer: Medicare Other | Admitting: Family Medicine

## 2017-04-24 ENCOUNTER — Telehealth: Payer: Self-pay

## 2017-04-24 NOTE — Telephone Encounter (Signed)
Tried to call patient to schedule AWV and received no asnwer, voicemail is full.

## 2017-05-27 ENCOUNTER — Ambulatory Visit
Admission: RE | Admit: 2017-05-27 | Discharge: 2017-05-27 | Disposition: A | Discharge status: Home / Self Care | Payer: Medicare Other | Source: Ambulatory Visit | Attending: Family Medicine | Admitting: Family Medicine

## 2017-05-27 DIAGNOSIS — Z1231 Encounter for screening mammogram for malignant neoplasm of breast: Secondary | ICD-10-CM | POA: Diagnosis not present

## 2017-05-27 DIAGNOSIS — Z139 Encounter for screening, unspecified: Secondary | ICD-10-CM

## 2017-06-20 ENCOUNTER — Telehealth: Payer: Self-pay

## 2017-06-20 NOTE — Telephone Encounter (Signed)
No answer- Called patient to schedule Medicare AWV and someone kept picking up the phone not saying anything.

## 2017-07-20 ENCOUNTER — Ambulatory Visit (INDEPENDENT_AMBULATORY_CARE_PROVIDER_SITE_OTHER): Payer: Medicare Other | Admitting: Family Medicine

## 2017-07-20 ENCOUNTER — Encounter: Payer: Self-pay | Admitting: Family Medicine

## 2017-07-20 ENCOUNTER — Other Ambulatory Visit: Payer: Self-pay

## 2017-07-20 VITALS — BP 118/70 | HR 82 | Temp 98.7°F | Resp 16 | Ht 62.21 in | Wt 122.2 lb

## 2017-07-20 DIAGNOSIS — R7303 Prediabetes: Secondary | ICD-10-CM

## 2017-07-20 DIAGNOSIS — R079 Chest pain, unspecified: Secondary | ICD-10-CM | POA: Diagnosis not present

## 2017-07-20 DIAGNOSIS — R6889 Other general symptoms and signs: Secondary | ICD-10-CM | POA: Diagnosis not present

## 2017-07-20 DIAGNOSIS — R638 Other symptoms and signs concerning food and fluid intake: Secondary | ICD-10-CM | POA: Diagnosis not present

## 2017-07-20 DIAGNOSIS — R6883 Chills (without fever): Secondary | ICD-10-CM

## 2017-07-20 DIAGNOSIS — R5383 Other fatigue: Secondary | ICD-10-CM | POA: Diagnosis not present

## 2017-07-20 LAB — POCT URINALYSIS DIP (MANUAL ENTRY)
BILIRUBIN UA: NEGATIVE mg/dL
Bilirubin, UA: NEGATIVE
GLUCOSE UA: NEGATIVE mg/dL
Leukocytes, UA: NEGATIVE
Nitrite, UA: NEGATIVE
Protein Ur, POC: NEGATIVE mg/dL
RBC UA: NEGATIVE
SPEC GRAV UA: 1.025 (ref 1.010–1.025)
Urobilinogen, UA: 0.2 E.U./dL
pH, UA: 6 (ref 5.0–8.0)

## 2017-07-20 LAB — POC MICROSCOPIC URINALYSIS (UMFC): MUCUS RE: ABSENT

## 2017-07-20 LAB — POCT CBC
GRANULOCYTE PERCENT: 42.3 % (ref 37–80)
HCT, POC: 36.7 % — AB (ref 37.7–47.9)
HEMOGLOBIN: 11.7 g/dL — AB (ref 12.2–16.2)
Lymph, poc: 2.3 (ref 0.6–3.4)
MCH: 26.5 pg — AB (ref 27–31.2)
MCHC: 32 g/dL (ref 31.8–35.4)
MCV: 83 fL (ref 80–97)
MID (CBC): 0.2 (ref 0–0.9)
MPV: 6.4 fL (ref 0–99.8)
POC Granulocyte: 1.9 — AB (ref 2–6.9)
POC LYMPH PERCENT: 52.2 %L — AB (ref 10–50)
POC MID %: 5.5 % (ref 0–12)
Platelet Count, POC: 263 10*3/uL (ref 142–424)
RBC: 4.43 M/uL (ref 4.04–5.48)
RDW, POC: 14.2 %
WBC: 4.4 10*3/uL — AB (ref 4.6–10.2)

## 2017-07-20 LAB — POCT SEDIMENTATION RATE: POCT SED RATE: 27 mm/hr — AB (ref 0–22)

## 2017-07-20 LAB — POCT GLYCOSYLATED HEMOGLOBIN (HGB A1C): HEMOGLOBIN A1C: 5.9

## 2017-07-20 LAB — GLUCOSE, POCT (MANUAL RESULT ENTRY): POC GLUCOSE: 96 mg/dL (ref 70–99)

## 2017-07-20 NOTE — Progress Notes (Addendum)
Subjective:  By signing my name below, I, Emily [name], attest that this documentation has been prepared under the direction and in the presence of Delman Cheadle, MD. Electronically Signed: Moises [name], [place]. 07/20/2017 , 2:35 PM .  Patient was seen in Room 2 .   Patient ID: Emily [name], female    DOB: [birth year], 67 y.o.   MRN: [id number] Chief Complaint  Patient presents with  . Fatigue    with some chills x 2 months    HPI Emily [name] is a 67 y.o. female who presents to Primary Care at [facility] complaining of chills and also chest tightness. Patient reports having chest tightness that occur about once a week, like something is stuck in her chest for the last month. She notes that it only lasts for a moment / for a few minutes. She denies chest tightness occurring with activity or exertion. She denies difficulty with swallowing. She denies heartburn or reflux. She denies thick mucus in the morning. She denies loss of voice, hoarseness or cough. She denies fatigue. She denies any chest tightness when chasing after her grandchildren.   Prior to this, she's noticed chills ongoing for about 2 months now. She describes feeling colder than usual. She's been busy taking care of her grandchildren. She hasn't eaten a full meal today, only a few graham crackers, fruit and some coffee. She's been taking iron supplements. She's been able to sleep well at night.   She's planning to travel up to [place] for her mother's 93rd birthday.   Past Medical History:  Diagnosis Date  . Anemia    iron runs low  . Anxiety   . Bronchitis   . Cataract   . History of chicken pox   . History of uterine fibroid   . Nontraumatic tear of skin    right side of vagina skin since 06-12-15, small amount of [name] when wipes  . Tuberculosis    exposed as a young child   Past Surgical History:  Procedure Laterality Date  . COLONOSCOPY WITH PROPOFOL N/A 04/24/2016   Procedure: COLONOSCOPY WITH PROPOFOL;  Surgeon:  Garlan Fair, MD;  Location: [facility];  Service: Endoscopy;  Laterality: N/A;  . EYE SURGERY Bilateral    lens replacements  . FRACTURE SURGERY Right 2003   fibula  . surgery for fibroids  2000   Prior to Admission medications   Medication Sig Start Date End Date Taking? Authorizing Provider  COD LIVER OIL PO Take by mouth.    [provider]  Ferrous Sulfate (IRON) 142 (45 Fe) MG TBCR Take 142 mg by mouth daily.    [provider]  glucosamine-chondroitin 500-400 MG tablet Take 1 tablet by mouth 3 (three) times daily.    [provider]   Allergies  Allergen Reactions  . Latex Itching and Rash   Family History  Problem Relation Age of Onset  . Diabetes Brother   . Hypertension Brother   . Alcohol abuse Brother   . Mental illness Brother        nervous breakdown  . Stroke Maternal Grandmother   . Depression Paternal Grandmother   . Arthritis Mother   . COPD Father 84       decsd due to lack of oxygen  . Hypertension Father   . Alcohol abuse Father   . Gout Father   . Alcohol abuse Brother   . Hypertension Brother    Social History   Socioeconomic History  . Marital  status: Divorced    Spouse name: None  . Number of children: None  . Years of education: None  . Highest education level: None  Social Needs  . Financial resource strain: None  . Food insecurity - worry: None  . Food insecurity - inability: None  . Transportation needs - medical: None  . Transportation needs - non-medical: None  Occupational History  . None  Tobacco Use  . Smoking status: Never Smoker  . Smokeless tobacco: Never Used  Substance and Sexual Activity  . Alcohol use: No  . Drug use: No  . Sexual activity: No  Other Topics Concern  . None  Social History Narrative  . None   Depression screen [facility] 2/9 07/20/2017 02/16/2017 12/15/2016 09/24/2016 09/17/2016  Decreased Interest 0 0 0 0 0  Down, Depressed, Hopeless 0 0 0 0 0  PHQ - 2 Score 0 0 0 0 0     Review of Systems  Constitutional: Positive for chills. Negative for fatigue, fever and unexpected weight change.  Respiratory: Positive for chest tightness. Negative for cough.   Gastrointestinal: Negative for constipation, diarrhea, nausea and vomiting.  Endocrine: Positive for cold intolerance.  Skin: Negative for rash and wound.  Neurological: Negative for dizziness, weakness and headaches.       Objective:   Physical Exam  Constitutional: She is oriented to person, place, and time. She appears well-developed and well-nourished. No distress.  HENT:  Head: Normocephalic and atraumatic.  Right Ear: Tympanic membrane normal.  Left Ear: Tympanic membrane normal.  Nose: Nose normal.  Post oropharynx with clear postnasal drip  Eyes: EOM are normal. Pupils are equal, round, and reactive to light.  Neck: Neck supple. No thyromegaly present.  Cardiovascular: Normal rate, regular rhythm, S1 normal, S2 normal and normal heart sounds.  No murmur heard. Bowel sounds heard throughout her chest  Pulmonary/Chest: Effort normal and breath sounds normal. No respiratory distress. She has no wheezes.  Musculoskeletal: Normal range of motion.  Lymphadenopathy:    She has no cervical adenopathy.  Neurological: She is alert and oriented to person, place, and time.  Skin: Skin is warm and dry.  Psychiatric: She has a normal mood and affect. Her behavior is normal.  Nursing note and vitals reviewed.   BP 118/70   Pulse 82   Temp 98.7 F (37.1 C) (Oral)   Resp 16   Ht 5' 2.21" (1.58 m)   Wt 122 lb 3.2 oz (55.4 kg)   SpO2 97%   BMI 22.20 kg/m      Results for orders placed or performed in visit on 07/20/17  POCT CBC  Result Value Ref Range   WBC 4.4 (A) 4.6 - 10.2 K/uL   Lymph, poc 2.3 0.6 - 3.4   POC LYMPH PERCENT 52.2 (A) 10 - 50 %L   MID (cbc) 0.2 0 - 0.9   POC MID % 5.5 0 - 12 %M   POC Granulocyte 1.9 (A) 2 - 6.9   Granulocyte percent 42.3 37 - 80 %G   RBC 4.43 4.04 - 5.48  M/uL   Hemoglobin 11.7 (A) 12.2 - 16.2 g/dL   HCT, POC 36.7 (A) 37.7 - 47.9 %   MCV 83.0 80 - 97 fL   MCH, POC 26.5 (A) 27 - 31.2 pg   MCHC 32.0 31.8 - 35.4 g/dL   RDW, POC 14.2 %   Platelet Count, POC 263 142 - 424 K/uL   MPV 6.4 0 - 99.8 fL  POCT SEDIMENTATION  RATE  Result Value Ref Range   POCT SED RATE 27 (A) 0 - 22 mm/hr  POCT urinalysis dipstick  Result Value Ref Range   Color, UA yellow yellow   Clarity, UA clear clear   Glucose, UA negative negative mg/dL   Bilirubin, UA negative negative   Ketones, POC UA negative negative mg/dL   Spec Grav, UA 1.025 1.010 - 1.025   [name], UA negative negative   pH, UA 6.0 5.0 - 8.0   Protein Ur, POC negative negative mg/dL   Urobilinogen, UA 0.2 0.2 or 1.0 E.U./dL   Nitrite, UA Negative Negative   Leukocytes, UA Negative Negative  POCT Microscopic Urinalysis ([facility])  Result Value Ref Range   WBC,UR,HPF,POC None None WBC/hpf   RBC,UR,HPF,POC Few (A) None RBC/hpf   Bacteria None None, Too numerous to count   Mucus Absent Absent   Epithelial Cells, UR Per Microscopy Moderate (A) None, Too numerous to count cells/hpf  POCT glucose (manual entry)  Result Value Ref Range   POC Glucose 96 70 - 99 mg/dl  POCT glycosylated hemoglobin (Hb A1C)  Result Value Ref Range   Hemoglobin A1C 5.9     Assessment & Plan:   1. Chills   2. Prediabetes   3. Chest pain, unspecified type   4. Cold sensitivity   5. Alteration in eating processes    Pt's main concern today is her "chills."  Otherwise seems to be doing much better. Will check labs and pt plans to make appt for CPE. Poss hiatal hernia as cause of CP? Offered CXR but will hold off at this point since sxs have largely resolved and are so infrequent.   Orders Placed This Encounter  Procedures  . Comprehensive metabolic panel  . TSH  . Vitamin B12  . Ferritin  . POCT CBC  . POCT SEDIMENTATION RATE  . POCT urinalysis dipstick  . POCT Microscopic Urinalysis ([facility])  . POCT glucose  (manual entry)  . POCT glycosylated hemoglobin (Hb A1C)     I personally performed the services described in this documentation, which was scribed in my presence. The recorded information has been reviewed and considered, and addended by me as needed.   Delman Cheadle, M.D.  Primary Care at [facility] [address] [place], [place] [address] [phone] phone [phone] fax  07/20/17 5:25 PM

## 2017-07-20 NOTE — Patient Instructions (Addendum)
IF you received an x-ray today, you will receive an invoice from [facility]. Please contact [facility] at [phone] with questions or concerns regarding your invoice.   IF you received labwork today, you will receive an invoice from [place]. Please contact [facility] at [phone] with questions or concerns regarding your invoice.   Our billing staff will not be able to assist you with questions regarding bills from these companies.  You will be contacted with the lab results as soon as they are available. The fastest way to get your results is to activate your My Chart account. Instructions are located on the last page of this paperwork. If you have not heard from [place] regarding the results in 2 weeks, please contact this office.     Eating Healthy on a Budget There are many ways to save money at the grocery store and continue to eat healthy. You can be successful if you plan your meals according to your budget, purchase according to your budget and grocery list, and prepare food yourself. How can I buy more food on a limited budget? Plan  Plan meals and snacks according to a grocery list and budget you create.  Look for recipes where you can cook once and make enough food for two meals.  Include meals that will "stretch" more expensive foods such as stews, casseroles, and stir-fry dishes.  Make a grocery list and make sure to bring it with you to the store. If you have a smart phone, you could use your phone to create your shopping list. Purchase  When grocery shopping, buy only the items on your grocery list and go only to the areas of the store that have the items on your list. Prepare  Some meal items can be prepared in advance. Pre-cook on days when you have extra time.  Make extra food (such as by doubling recipes) and freeze the extras in meal-sized containers or in individual portions for fast meals and snacks.  Use leftovers in your meal plan for the  week.  Try some meatless meals or try "no cook" meals like salads.  When you come home from the grocery store, wash and prepare your fruits and vegetables so they are ready to use and eat. This will help reduce food waste. How can I buy more food on a limited budget? Try these tips the next time you go shopping:  [place] store brands or generic brands.  Use coupons only for foods and brands you normally buy. Avoid buying items you wouldn't normally buy simply because they are on sale.  Check online and in newspapers for weekly deals.  Buy healthy items from the bulk bins when available, such as herbs, spices, flours, pastas, nuts, and dried fruit.  Buy fruits and vegetables that are in season. Prices are usually lower on in-season produce.  Compare and contrast different items. You can do this by looking at the unit price on the price tag. Use it to compare different brands and sizes to find out which item is the best deal.  Choose naturally low-cost healthy items, such as carrots, potatoes, apples, bananas, and oranges. Dried or canned beans are a low-cost protein source.  Buy in bulk and freeze extra food. Items you can buy in bulk include meats, fish, poultry, frozen fruits, and frozen vegetables.  Limit the purchase of prepared or "ready-to-eat" foods, such as pre-cut fruits and vegetables and pre-made salads.  If possible, shop around to discover which grocery store offers  the best prices. Some stores charge much more than other stores for the same items.  Do not shop when you are hungry. If you shop while hungry, It may be hard to stick to your list and budget.  Stick to your list and resist impulse buys. Treat your list as your official plan for the week.  Buy a variety of vegetables and fruit by purchasing fresh, frozen, and canned items.  Look beyond eye level. Foods at eye level (adult or child eye level) are more expensive. Look at the top and bottom shelves for deals.  Be  efficient with your time when shopping. The more time you spend at the store, the more money you are likely to spend.  Consider other retailers such as dollar stores, larger [employer], local fruit and vegetable stands, and farmers markets.  What are some tips for less expensive food substitutions? When choosing more expensive foods like meats and dairy, try these tips to save money:  Choose cheaper cuts of meat, such as bone-in chicken thighs and drumsticks instead skinless and boneless chicken. When you are ready to prepare the chicken, you can remove the skin yourself to make it healthier.  Choose lean meats like chicken or [place]. When choosing ground beef, make sure it is lean ground beef (92% lean, 8% fat). If you do buy a fattier ground beef, drain the fat before eating.  Buy dried beans and peas, such as lentils, split peas, or kidney beans.  For seafood, choose canned tuna, salmon, or sardines.  Eggs are a low-cost source of protein.  Buy the larger tubs of yogurt instead of individual-sized containers.  Choose water instead of sodas and other sweetened beverages.  Skip buying chips, cookies, and other "junk food". These items are usually expensive, high in calories, and low in nutritional value.  How can I prepare the foods I buy in the healthiest way? Practice these tips for cooking foods in the healthiest way to reduce excess fat and calorie intake:  Steam, saute, grill, or bake foods instead of frying them.  Make sure half your plate is filled with fruits or vegetables. Choose from fresh, frozen, or canned fruits and vegetables. If eating canned, remember to rinse them before eating. This will remove any excess salt added for packaging.  Trim all fat from meat before cooking. Remove the skin from chicken or [place].  Spoon off fat from meat dishes once they have been chilled in the refrigerator and the fat has hardened on the top.  Use skim milk, low-fat milk, or  evaporated skim milk when making cream sauces, soups, or puddings.  Substitute low-fat yogurt, sour cream, or cottage cheese for sour cream and mayonnaise in dips and dressings.  Try lemon juice, herbs, or spices to season food instead of salt, butter, or margarine.  This information is not intended to replace advice given to you by your health care provider. Make sure you discuss any questions you have with your health care provider. Document Released: 01/01/2014 Document Revised: 11/18/2015 Document Reviewed: 12/01/2013 Elsevier Interactive Patient Education  [employer].

## 2017-07-21 LAB — COMPREHENSIVE METABOLIC PANEL
ALT: 19 IU/L (ref 0–32)
AST: 23 IU/L (ref 0–40)
Albumin/Globulin Ratio: 1.5 (ref 1.2–2.2)
Albumin: 4.2 g/dL (ref 3.6–4.8)
Alkaline Phosphatase: 57 IU/L (ref 39–117)
BUN/Creatinine Ratio: 26 (ref 12–28)
BUN: 18 mg/dL (ref 8–27)
CALCIUM: 9.2 mg/dL (ref 8.7–10.3)
CO2: 26 mmol/L (ref 20–29)
CREATININE: 0.7 mg/dL (ref 0.57–1.00)
Chloride: 101 mmol/L (ref 96–106)
GFR calc non Af Amer: 89 mL/min/{1.73_m2} (ref >59)
GFR, EST AFRICAN AMERICAN: 103 mL/min/{1.73_m2} (ref >59)
Globulin, Total: 2.8 g/dL (ref 1.5–4.5)
Glucose: 89 mg/dL (ref 65–99)
Potassium: 3.9 mmol/L (ref 3.5–5.2)
SODIUM: 140 mmol/L (ref 134–144)
Total Protein: 7 g/dL (ref 6.0–8.5)

## 2017-07-21 LAB — TSH: TSH: 0.603 u[IU]/mL (ref 0.450–4.500)

## 2017-07-21 LAB — VITAMIN B12: Vitamin B-12: 506 pg/mL (ref 232–1245)

## 2017-07-21 LAB — FERRITIN: Ferritin: 46 ng/mL (ref 15–150)

## 2017-08-14 ENCOUNTER — Encounter: Payer: Self-pay | Admitting: Physician Assistant

## 2017-08-28 ENCOUNTER — Telehealth: Payer: Self-pay | Admitting: Family Medicine

## 2017-08-28 NOTE — Telephone Encounter (Signed)
Copied from [place] [phone]. Topic: Quick Communication - See Telephone Encounter >> Aug 28, 2017  3:56 PM Vernona Rieger wrote: CRM for notification. See Telephone encounter for: 08/28/17.  Patient would like to know what her results are from 3/9. Please call her at [phone]

## 2017-08-29 NOTE — Telephone Encounter (Signed)
Everything was normal. Pre-diabetes was stable

## 2017-08-29 NOTE — Telephone Encounter (Signed)
Provider, please review and release labs.

## 2017-08-30 ENCOUNTER — Encounter: Payer: Self-pay | Admitting: *Deleted

## 2017-08-30 ENCOUNTER — Encounter: Payer: Self-pay | Admitting: Radiology

## 2017-08-30 ENCOUNTER — Telehealth: Payer: Self-pay | Admitting: *Deleted

## 2017-08-30 NOTE — Telephone Encounter (Signed)
Lab letter already sent to pt.

## 2017-08-30 NOTE — Telephone Encounter (Signed)
Pt advised.

## 2017-08-30 NOTE — Telephone Encounter (Signed)
Pt returning call for lab results. States she did not receive a letter.      Primary Information   Source Subject Topic  Emily [name] (Patient) Emily [name] (Patient) Quick Communication - Lab Results  Called patient to inform them of lab results. When patient returns call, triage nurse may disclose results.

## 2017-08-30 NOTE — Telephone Encounter (Signed)
Attempted to reach pt. Mail box is full - unable to leave a message.

## 2017-08-30 NOTE — Telephone Encounter (Signed)
Attempted call to pt- mailbox full and cannot accept messages. Will return call again with lab results per Dr. Brigitte Pulse

## 2017-09-13 ENCOUNTER — Ambulatory Visit: Payer: Medicare Other | Admitting: Family Medicine

## 2017-10-02 ENCOUNTER — Ambulatory Visit (INDEPENDENT_AMBULATORY_CARE_PROVIDER_SITE_OTHER): Payer: Medicare Other | Admitting: Family Medicine

## 2017-10-02 ENCOUNTER — Encounter: Payer: Self-pay | Admitting: Family Medicine

## 2017-10-02 VITALS — BP 96/60 | HR 84 | Temp 97.8°F | Resp 17 | Ht 62.0 in | Wt 123.0 lb

## 2017-10-02 DIAGNOSIS — Z0000 Encounter for general adult medical examination without abnormal findings: Secondary | ICD-10-CM

## 2017-10-02 DIAGNOSIS — Z136 Encounter for screening for cardiovascular disorders: Secondary | ICD-10-CM

## 2017-10-02 DIAGNOSIS — R7303 Prediabetes: Secondary | ICD-10-CM

## 2017-10-02 DIAGNOSIS — Z1383 Encounter for screening for respiratory disorder NEC: Secondary | ICD-10-CM

## 2017-10-02 DIAGNOSIS — Z1389 Encounter for screening for other disorder: Secondary | ICD-10-CM

## 2017-10-02 NOTE — Patient Instructions (Addendum)
     IF you received an x-ray today, you will receive an invoice from [facility]. Please contact [facility] at [phone] with questions or concerns regarding your invoice.   IF you received labwork today, you will receive an invoice from [place]. Please contact [facility] at [phone] with questions or concerns regarding your invoice.   Our billing staff will not be able to assist you with questions regarding bills from these companies.  You will be contacted with the lab results as soon as they are available. The fastest way to get your results is to activate your My Chart account. Instructions are located on the last page of this paperwork. If you have not heard from [place] regarding the results in 2 weeks, please contact this office.      Ms. Casavant , Thank you for taking time to come for your Medicare Wellness Visit. I appreciate your ongoing commitment to your health goals. Please review the following plan we discussed and let me know if I can assist you in the future.   These are the goals we discussed: Goals    None      This is a list of the screening recommended for you and due dates:  Health Maintenance  Topic Date Due  . Tetanus Vaccine  05/14/2017  . Flu Shot  12/12/2017  . Mammogram  05/28/2019  . Colon Cancer Screening  04/24/2026  . DEXA scan (bone density measurement)  Completed  .  Hepatitis C: One time screening is recommended by Center for Disease Control  (CDC) for  adults born from [birth year] through 1967.   Completed  . Pneumonia vaccines  Completed

## 2017-10-02 NOTE — Progress Notes (Signed)
Patient ID: Emily [name], female   DOB: [birth year], 67 y.o.   MRN: [id number] Subjective:    Emily [name] is a 67 y.o. female who presents for Medicare Annual/Subsequent preventive examination.  Preventive Screening-Counseling & Management  Tobacco Social History   Tobacco Use  Smoking Status Never Smoker  Smokeless Tobacco Never Used     Problems Prior to Visit 1.   Current Problems (verified) Patient Active Problem List   Diagnosis Date Noted  . Prediabetes 11/21/2012  . Breast fibrocystic disorder 11/21/2012  . Chest pain 11/21/2012  . Anxiety state, unspecified 11/21/2012  . Incidental lung nodule, > 61mm and < 68mm 02/15/2012    Medications Prior to Visit No current outpatient medications on file prior to visit.   No current facility-administered medications on file prior to visit.     Current Medications (verified) No current outpatient medications on file.   No current facility-administered medications for this visit.      Allergies (verified) Latex   PAST HISTORY  Family History Family History  Problem Relation Age of Onset  . Diabetes Brother   . Hypertension Brother   . Alcohol abuse Brother   . Mental illness Brother        nervous breakdown  . Stroke Maternal Grandmother   . Depression Paternal Grandmother   . Arthritis Mother   . COPD Father 4       decsd due to lack of oxygen  . Hypertension Father   . Alcohol abuse Father   . Gout Father   . Alcohol abuse Brother   . Hypertension Brother     Social History Social History   Tobacco Use  . Smoking status: Never Smoker  . Smokeless tobacco: Never Used  Substance Use Topics  . Alcohol use: No     Are there smokers in your home (other than you)? No  Risk Factors Current exercise habits: The patient has a physically strenuous job, but has no regular exercise apart from work.  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  Depression Screen Depression screen [facility] 2/9 10/02/2017 07/20/2017 02/16/2017 12/15/2016 09/24/2016  Decreased Interest 0 0 0 0 0  Down, Depressed, Hopeless 0 0 0 0 0  PHQ - 2 Score 0 0 0 0 0     Activities of Daily Living In your present state of health, do you have any difficulty performing the following activities?:  Driving? No Managing money?  No Feeding yourself? No Getting from bed to chair? No Climbing a flight of stairs? No Preparing food and eating?: No Bathing or showering? No Getting dressed: No Getting to the toilet? No Using the toilet:No Moving around from place to place: No In the past year have you fallen or had a near fall?:No   Are you sexually active?  No  Do you have more than one partner?  No  Hearing Difficulties: No Do you often ask people to speak up or repeat themselves? No Do you experience ringing or noises in your ears? No Do you have difficulty understanding soft or whispered voices? No   Do you feel that you have a problem with memory? No  Do you often misplace items? No  Do you feel safe at home?  Yes  Cognitive Testing  Alert? Yes  Normal Appearance?Yes  Oriented to person? Yes  Place? Yes   Time? Yes  Recall of three objects?  Yes  Can perform simple calculations? Yes  Displays appropriate judgment?Yes   Advanced Directives have been discussed with the patient? No  List the Names of Other Physician/Practitioners you currently use: 1.  none  Indicate any recent Medical Services you may have received from other than [facility] providers in the past year (date may be approximate).  Immunization History  Administered Date(s) Administered  . Influenza,inj,Quad PF,6+ Mos 01/27/2015, 02/16/2017  . Influenza-Unspecified 01/29/2013, 02/25/2016  . Pneumococcal Conjugate-13 01/27/2015  . Pneumococcal  Polysaccharide-23 03/29/2016  . Zoster Recombinat (Shingrix) 07/31/2016    Screening Tests Health Maintenance  Topic Date Due  . TETANUS/TDAP  05/14/2017  . INFLUENZA VACCINE  12/12/2017  . MAMMOGRAM  05/28/2019  . COLONOSCOPY  04/24/2026  . DEXA SCAN  Completed  . Hepatitis C Screening  Completed  . PNA vac Low Risk Adult  Completed    All answers were reviewed with the patient and necessary referrals were made:  Shawnee Knapp, MD   10/02/2017   History reviewed: allergies, current medications, past family history, past medical history, past social history, past surgical history and problem list  Review of Systems A comprehensive review of systems was negative.    Objective:    Visual Acuity Screening   Right eye Left eye Both eyes  Without correction: 20/40 20/30 20/30   With correction:       Body mass index is 22.5 kg/m. BP 96/60   Pulse 84   Temp 97.8 F (36.6 C) (Oral)   Resp 17   Ht 5\' 2"  (1.575 m)   Wt 123 lb (55.8 kg)   SpO2 98%   BMI 22.50 kg/m   BP 96/60   Pulse 84   Temp 97.8 F (36.6 C) (Oral)   Resp 17   Ht 5\' 2"  (1.575 m)   Wt 123 lb (55.8 kg)   SpO2 98%   BMI 22.50 kg/m   General Appearance:    Alert, cooperative, no distress, appears stated age  Head:    Normocephalic, without obvious abnormality, atraumatic  Eyes:    PERRL, conjunctiva/corneas clear, EOM's intact, fundi    benign, both eyes  Ears:    Normal TM's and external ear canals, both ears  Nose:   Nares normal, septum midline, mucosa normal, no drainage    or sinus tenderness  Throat:   Lips, mucosa, and tongue normal; teeth and gums normal  Neck:   Supple, symmetrical, trachea midline, no adenopathy;    thyroid:  no enlargement/tenderness/nodules; no carotid   bruit or JVD  Back:     Symmetric, no curvature, ROM normal, no CVA tenderness  Lungs:     Clear to auscultation bilaterally, respirations unlabored  Chest Wall:    No tenderness or deformity   Heart:    Regular rate  and rhythm, S1 and S2 normal, no murmur, rub   or gallop  Breast Exam:    No tenderness, masses, or nipple abnormality  Abdomen:     Soft, non-tender, bowel sounds active all four quadrants,    no masses, no organomegaly  Genitalia:    Normal female without lesion, discharge or tenderness  Rectal:    Normal tone, normal prostate, no masses or tenderness;   guaiac negative stool  Extremities:   Extremities normal, atraumatic, no cyanosis or edema  Pulses:   2+ and symmetric all extremities  Skin:  Skin color, texture, turgor normal, no rashes or lesions  Lymph nodes:   Cervical, supraclavicular, and axillary nodes normal  Neurologic:   CNII-XII intact, normal strength, sensation and reflexes    throughout       Assessment:     1. Medicare annual wellness visit, subsequent   2. Screening for cardiovascular, respiratory, and genitourinary diseases   3. Prediabetes         Plan:     During the course of the visit the patient was educated and counseled about appropriate screening and preventive services including:    Influenza vaccine  Td vaccine  Screening mammography  Bone densitometry screening  Colorectal cancer screening  Diabetes screening  Diet review for nutrition referral? Yes ____  Not Indicated _x___   Patient Instructions (the written plan) was given to the patient.  Medicare Attestation I have personally reviewed: The patient's medical and social history Their use of alcohol, tobacco or illicit drugs Their current medications and supplements The patient's functional ability including ADLs,fall risks, home safety risks, cognitive, and hearing and visual impairment Diet and physical activities Evidence for depression or mood disorders  The patient's weight, height, BMI, and visual acuity have been recorded in the chart.  I have made referrals, counseling, and provided education to the patient based on review of the above and I have provided the patient  with a written personalized care plan for preventive services.     Shawnee Knapp, MD   10/02/2017

## 2017-10-02 NOTE — Progress Notes (Deleted)
By signing my name below, I, [name], attest that this documentation has been prepared under the direction and in the presence of Delman Cheadle, MD. Electronically Signed: [name], [place]. 10/02/2017 , 2:46 PM .  Patient was seen in Room 2 .  Patient ID: Emily [name], female   DOB: [birth year], 67 y.o.   MRN: [id number] Chief Complaint  Patient presents with  . Annual Exam

## 2018-03-31 ENCOUNTER — Ambulatory Visit: Payer: Medicare Other | Admitting: Family Medicine

## 2018-04-07 DIAGNOSIS — H35413 Lattice degeneration of retina, bilateral: Secondary | ICD-10-CM | POA: Diagnosis not present

## 2018-04-07 DIAGNOSIS — Z961 Presence of intraocular lens: Secondary | ICD-10-CM | POA: Diagnosis not present

## 2018-04-07 DIAGNOSIS — H35033 Hypertensive retinopathy, bilateral: Secondary | ICD-10-CM | POA: Diagnosis not present

## 2018-04-07 DIAGNOSIS — H353111 Nonexudative age-related macular degeneration, right eye, early dry stage: Secondary | ICD-10-CM | POA: Diagnosis not present

## 2018-04-08 LAB — HM DIABETES EYE EXAM

## 2018-05-06 ENCOUNTER — Ambulatory Visit (INDEPENDENT_AMBULATORY_CARE_PROVIDER_SITE_OTHER): Payer: Medicare Other | Admitting: Family Medicine

## 2018-05-06 ENCOUNTER — Encounter

## 2018-05-06 ENCOUNTER — Other Ambulatory Visit: Payer: Self-pay

## 2018-05-06 ENCOUNTER — Encounter: Payer: Self-pay | Admitting: Family Medicine

## 2018-05-06 VITALS — BP 106/68 | HR 79 | Temp 98.3°F | Resp 16 | Ht 62.25 in | Wt 131.6 lb

## 2018-05-06 DIAGNOSIS — R7303 Prediabetes: Secondary | ICD-10-CM

## 2018-05-06 DIAGNOSIS — E7439 Other disorders of intestinal carbohydrate absorption: Secondary | ICD-10-CM | POA: Diagnosis not present

## 2018-05-06 DIAGNOSIS — R600 Localized edema: Secondary | ICD-10-CM | POA: Diagnosis not present

## 2018-05-06 LAB — POCT URINALYSIS DIP (MANUAL ENTRY)
Bilirubin, UA: NEGATIVE
Blood, UA: NEGATIVE
Glucose, UA: NEGATIVE mg/dL
Ketones, POC UA: NEGATIVE mg/dL
Leukocytes, UA: NEGATIVE
Nitrite, UA: NEGATIVE
PH UA: 6.5 (ref 5.0–8.0)
Spec Grav, UA: 1.025 (ref 1.010–1.025)
UROBILINOGEN UA: 1 U/dL

## 2018-05-06 LAB — POCT GLYCOSYLATED HEMOGLOBIN (HGB A1C): HEMOGLOBIN A1C: 6 % — AB (ref 4.0–5.6)

## 2018-05-06 LAB — GLUCOSE, POCT (MANUAL RESULT ENTRY): POC Glucose: 100 mg/dl — AB (ref 70–99)

## 2018-05-06 MED ORDER — TRIAMCINOLONE ACETONIDE 0.1 % EX CREA: 1.0000 "application " | TOPICAL_CREAM | Freq: Two times a day (BID) | CUTANEOUS | 0 refills | Status: DC | PRN

## 2018-05-06 NOTE — Patient Instructions (Addendum)
I suspect the swelling in your legs is coming from the fact that you have gained a little over 10 pounds with the diet changes you have made since I last saw you.  Drinking plenty of water to flush out the salt like can be found in any preprepared or processed food including canned soups and frozen foods will be very important.  Recommend starting to wear compression hose.  If you are still having swelling or having worsening pain despite increasing water and protein in your diet and decreasing salt and wearing compression hose, please call let me know and I will refer you to vascular surgery for further evaluation of your swelling.    If you have lab work done today you will be contacted with your lab results within the next 2 weeks.  If you have not heard from [place] then please contact us. The fastest way to get your results is to register for My Chart.   IF you received an x-ray today, you will receive an invoice from [facility]. Please contact [facility] at [phone] with questions or concerns regarding your invoice.   IF you received labwork today, you will receive an invoice from [facility]. Please contact [facility] at [phone] with questions or concerns regarding your invoice.   Our billing staff will not be able to assist you with questions regarding bills from these companies.  You will be contacted with the lab results as soon as they are available. The fastest way to get your results is to activate your My Chart account. Instructions are located on the last page of this paperwork. If you have not heard from [place] regarding the results in 2 weeks, please contact this office.    Edema  Edema is when you have too much fluid in your body or under your skin. Edema may make your legs, feet, and ankles swell up. Swelling is also common in looser tissues, like around your eyes. This is a common condition. It gets more common as you get older. There are many possible causes of  edema. Eating too much salt (sodium) and being on your feet or sitting for a long time can cause edema in your legs, feet, and ankles. Hot weather may make edema worse. Edema is usually painless. Your skin may look swollen or shiny. Follow these instructions at home:  Keep the swollen body part raised (elevated) above the level of your heart when you are sitting or lying down.  Do not sit still or stand for a long time.  Do not wear tight clothes. Do not wear garters on your upper legs.  Exercise your legs. This can help the swelling go down.  Wear elastic bandages or support stockings as told by your doctor.  Eat a low-salt (low-sodium) diet to reduce fluid as told by your doctor.  Depending on the cause of your swelling, you may need to limit how much fluid you drink (fluid restriction).  Take over-the-counter and prescription medicines only as told by your doctor. Contact a doctor if:  Treatment is not working.  You have heart, liver, or kidney disease and have symptoms of edema.  You have sudden and unexplained weight gain. Get help right away if:  You have shortness of breath or chest pain.  You cannot breathe when you lie down.  You have pain, redness, or warmth in the swollen areas.  You have heart, liver, or kidney disease and get edema all of a sudden.  You have a fever  and your symptoms get worse all of a sudden. Summary  Edema is when you have too much fluid in your body or under your skin.  Edema may make your legs, feet, and ankles swell up. Swelling is also common in looser tissues, like around your eyes.  Raise (elevate) the swollen body part above the level of your heart when you are sitting or lying down.  Follow your doctor's instructions about diet and how much fluid you can drink (fluid restriction). This information is not intended to replace advice given to you by your health care provider. Make sure you discuss any questions you have with your  health care provider. Document Released: 10/17/2007 Document Revised: 05/18/2016 Document Reviewed: 05/18/2016 Elsevier Interactive Patient Education  2019 [employer].  Influenza (Flu) Vaccine (Inactivated or Recombinant): What You Need to Know 1. Why get vaccinated? Influenza vaccine can prevent influenza (flu). Flu is a contagious disease that spreads around the [place] every year, usually between October and May. Anyone can get the flu, but it is more dangerous for some people. Infants and young children, people 82 years of age and older, pregnant women, and people with certain health conditions or a weakened immune system are at greatest risk of flu complications. Pneumonia, bronchitis, sinus infections and ear infections are examples of flu-related complications. If you have a medical condition, such as heart disease, cancer or diabetes, flu can make it worse. Flu can cause fever and chills, sore throat, muscle aches, fatigue, cough, headache, and runny or stuffy nose. Some people may have vomiting and diarrhea, though this is more common in children than adults. Each year thousands of people in the [place] die from flu, and many more are hospitalized. Flu vaccine prevents millions of illnesses and flu-related visits to the doctor each year. 2. Influenza vaccine CDC recommends everyone 31 months of age and older get vaccinated every flu season. Children 6 months through 32 years of age may need 2 doses during a single flu season. Everyone else needs only 1 dose each flu season. It takes about 2 weeks for protection to develop after vaccination. There are many flu viruses, and they are always changing. Each year a new flu vaccine is made to protect against three or four viruses that are likely to cause disease in the upcoming flu season. Even when the vaccine doesn't exactly match these viruses, it may still provide some protection. Influenza vaccine does not cause flu. Influenza  vaccine may be given at the same time as other vaccines. 3. Talk with your health care provider Tell your vaccine provider if the person getting the vaccine:  Has had an allergic reaction after a previous dose of influenza vaccine, or has any severe, life-threatening allergies.  Has ever had Guillain-Barr Syndrome (also called GBS). In some cases, your health care provider may decide to postpone influenza vaccination to a future visit. People with minor illnesses, such as a cold, may be vaccinated. People who are moderately or severely ill should usually wait until they recover before getting influenza vaccine. Your health care provider can give you more information. 4. Risks of a vaccine reaction  Soreness, redness, and swelling where shot is given, fever, muscle aches, and headache can happen after influenza vaccine.  There may be a very small increased risk of Guillain-Barr Syndrome (GBS) after inactivated influenza vaccine (the flu shot). Young children who get the flu shot along with pneumococcal vaccine (PCV13), and/or DTaP vaccine at the same time might be slightly  more likely to have a seizure caused by fever. Tell your health care provider if a child who is getting flu vaccine has ever had a seizure. People sometimes faint after medical procedures, including vaccination. Tell your provider if you feel dizzy or have vision changes or ringing in the ears. As with any medicine, there is a very remote chance of a vaccine causing a severe allergic reaction, other serious injury, or death. 5. What if there is a serious problem? An allergic reaction could occur after the vaccinated person leaves the clinic. If you see signs of a severe allergic reaction (hives, swelling of the face and throat, difficulty breathing, a fast heartbeat, dizziness, or weakness), call 9-1-1 and get the person to the nearest hospital. For other signs that concern you, call your health care provider. Adverse  reactions should be reported to the Vaccine Adverse Event Reporting System (VAERS). Your health care provider will usually file this report, or you can do it yourself. Visit the VAERS website at www.vaers.SamedayNews.es or call 367-886-5747.VAERS is only for reporting reactions, and VAERS staff do not give medical advice. 6. The National Vaccine Injury Compensation Program The [employer] Vaccine Injury Compensation Program (VICP) is a federal program that was created to compensate people who may have been injured by certain vaccines. Visit the VICP website at GoldCloset.com.ee or call (386)746-6800 to learn about the program and about filing a claim. There is a time limit to file a claim for compensation. 7. How can I learn more?  Ask your healthcare provider.  Call your local or state health department.  Contact the Centers for Disease Control and Prevention (CDC): ? Call (662)737-3955 (1-800-CDC-INFO) or ? Visit CDC's https://gibson.com/ Vaccine Information Statement (Interim) Inactivated Influenza Vaccine (12/26/2017) This information is not intended to replace advice given to you by your health care provider. Make sure you discuss any questions you have with your health care provider. Document Released: 02/22/2006 Document Revised: 12/30/2017 Document Reviewed: 12/30/2017 Elsevier Interactive Patient Education  2019 [employer].

## 2018-05-06 NOTE — Progress Notes (Signed)
Subjective:    Patient: Emily [name]  DOB: [birth year]; 67 y.o.   MRN: [id number]  Chief Complaint  Patient presents with  . Follow-up    Prediabetes, per pt "overeall doing ok", she denies having chest discomfort.    HPI Vitamin D checked 09/2013 normal at 51.  She is taking calcium-Vitamin D3 one pill once a day.   Ferritin checked 9 months ago, normal in the 40s. If she goes 4-5 months w/o the iron she gets really sluggish. So taking iron supplement once a day. States that her weight has increased as she has been "popping to much of the wrong thing in her mouth" and has not been doing well on her diet.   Requests some more of the anti-itch cream.  Her back is itching - worse when she is stressed or tired.  Lipids last checked 2 years ago 02/2016 look great with total cholesterol 199 non-HDL 102 HDL 97 triglycerides 41 and LDL 94  Pre-Diabetes: Lab Results  Component Value Date   HGBA1C 5.9 07/20/2017   HGBA1C 5.9 09/24/2016   HGBA1C 5.9 02/23/2016      Medical History Past Medical History:  Diagnosis Date  . Anemia    iron runs low  . Anxiety   . Bronchitis   . Cataract   . History of chicken pox   . History of uterine fibroid   . Nontraumatic tear of skin    right side of vagina skin since 06-12-15, small amount of blood when wipes  . Tuberculosis    exposed as a young child   Past Surgical History:  Procedure Laterality Date  . COLONOSCOPY WITH PROPOFOL N/A 04/24/2016   Procedure: COLONOSCOPY WITH PROPOFOL;  Surgeon: Garlan Fair, MD;  Location: [facility];  Service: Endoscopy;  Laterality: N/A;  . EYE SURGERY Bilateral    lens replacements  . FRACTURE SURGERY Right 2003   fibula  . surgery for fibroids  2000   Current Outpatient Medications on File Prior to Visit  Medication Sig Dispense Refill  . calcium carbonate (OSCAL) 1500 (600 Ca) MG TABS tablet Take by mouth 2 (two) times daily with a meal.    . ferrous sulfate 325 (65 FE) MG tablet Take 325  mg by mouth daily with breakfast.    . Multiple Vitamins-Minerals (EYE VITAMINS PO) Take by mouth.    . Vitamin D, Cholecalciferol, 25 MCG (1000 UT) CAPS Take by mouth.     No current facility-administered medications on file prior to visit.    Allergies  Allergen Reactions  . Latex Itching and Rash   Family History  Problem Relation Age of Onset  . Diabetes Brother   . Hypertension Brother   . Alcohol abuse Brother   . Mental illness Brother        nervous breakdown  . Stroke Maternal Grandmother   . Depression Paternal Grandmother   . Arthritis Mother   . COPD Father 25       decsd due to lack of oxygen  . Hypertension Father   . Alcohol abuse Father   . Gout Father   . Alcohol abuse Brother   . Hypertension Brother    Social History   Socioeconomic History  . Marital status: Divorced    Spouse name: Not on file  . Number of children: Not on file  . Years of education: Not on file  . Highest education level: Not on file  Occupational History  . Not on file  Social  Needs  . Financial resource strain: Not on file  . Food insecurity:    Worry: Not on file    Inability: Not on file  . Transportation needs:    Medical: Not on file    Non-medical: Not on file  Tobacco Use  . Smoking status: Never Smoker  . Smokeless tobacco: Never Used  Substance and Sexual Activity  . Alcohol use: No  . Drug use: No  . Sexual activity: Never  Lifestyle  . Physical activity:    Days per week: Not on file    Minutes per session: Not on file  . Stress: Not on file  Relationships  . Social connections:    Talks on phone: Not on file    Gets together: Not on file    Attends religious service: Not on file    Active member of club or organization: Not on file    Attends meetings of clubs or organizations: Not on file    Relationship status: Not on file  Other Topics Concern  . Not on file  Social History Narrative  . Not on file   Depression screen [facility] 2/9 05/06/2018  10/02/2017 07/20/2017 02/16/2017 12/15/2016  Decreased Interest 0 0 0 0 0  Down, Depressed, Hopeless 0 0 0 0 0  PHQ - 2 Score 0 0 0 0 0    ROS As noted in HPI  Objective:  BP 106/68 (BP Location: Right Arm, Patient Position: Sitting, Cuff Size: Normal)   Pulse 79   Temp 98.3 F (36.8 C) (Oral)   Resp 16   Ht 5' 2.25" (1.581 m)   Wt 131 lb 9.6 oz (59.7 kg)   SpO2 98%   BMI 23.88 kg/m  Physical Exam Constitutional:      General: She is not in acute distress.    Appearance: She is well-developed. She is not diaphoretic.  HENT:     Head: Normocephalic and atraumatic.     Right Ear: External ear normal.     Left Ear: External ear normal.  Eyes:     General: No scleral icterus.    Conjunctiva/sclera: Conjunctivae normal.  Neck:     Musculoskeletal: Normal range of motion and neck supple.     Thyroid: No thyromegaly.  Cardiovascular:     Rate and Rhythm: Normal rate and regular rhythm.     Heart sounds: Normal heart sounds.  Pulmonary:     Effort: Pulmonary effort is normal. No respiratory distress.     Breath sounds: Normal breath sounds.  Musculoskeletal:        General: Swelling (1+ pitting edema bilaterally) present.  Lymphadenopathy:     Cervical: No cervical adenopathy.  Skin:    General: Skin is warm and dry.     Findings: No erythema.  Neurological:     Mental Status: She is alert and oriented to person, place, and time.  Psychiatric:        Behavior: Behavior normal.   Legs equivalent circumferance bilaterally at 27.5 cm   POC TESTING Office Visit on 05/06/2018  Component Date Value Ref Range Status  . Color, UA 05/06/2018 yellow  yellow Final  . Clarity, UA 05/06/2018 clear  clear Final  . Glucose, UA 05/06/2018 negative  negative mg/dL Final  . Bilirubin, UA 05/06/2018 negative  negative Final  . Ketones, POC UA 05/06/2018 negative  negative mg/dL Final  . Spec Grav, UA 05/06/2018 1.025  1.010 - 1.025 Final  . Blood, UA 05/06/2018 negative  negative Final    .  pH, UA 05/06/2018 6.5  5.0 - 8.0 Final  . Protein Ur, POC 05/06/2018 trace* negative mg/dL Final  . Urobilinogen, UA 05/06/2018 1.0  0.2 or 1.0 E.U./dL Final  . Nitrite, UA 05/06/2018 Negative  Negative Final  . Leukocytes, UA 05/06/2018 Negative  Negative Final  . Hemoglobin A1C 05/06/2018 6.0* 4.0 - 5.6 % Final  . POC Glucose 05/06/2018 100* 70 - 99 mg/dl Final     Assessment & Plan:   1. Prediabetes   2. Glucose intolerance   3. Pedal edema    Patient will continue on current chronic medications other than changes noted above, so ok to refill when needed.   See after visit summary for patient specific instructions.  Orders Placed This Encounter  Procedures  . Flu vaccine HIGH DOSE PF (Fluzone High dose)  . CBC  . Comprehensive metabolic panel  . TSH  . POCT urinalysis dipstick  . POCT glycosylated hemoglobin (Hb A1C)  . POCT glucose (manual entry)    Meds ordered this encounter  Medications  . triamcinolone cream (KENALOG) 0.1 %    Sig: Apply 1 application topically 2 (two) times daily as needed.    Dispense:  453.6 g    Refill:  0    **MIX IN 1:1 RATIO WITH EUCERIN CREAM PLEASE**    Patient verbalized to me that they understand the following: diagnosis, what is being done for them, what to expect and what should be done at home.  Their questions have been answered. They understand that I am unable to predict every possible medication interaction or adverse outcome and that if any unexpected symptoms arise, they should contact us and their pharmacist, as well as never hesitate to seek urgent/emergent care at [facility] or ER if they think it might be warranted.    Delman Cheadle, MD, MPH [facility] [place] [address] [place], [place]  [phone] Office phone  [phone] Office fax  05/06/18 10:30 AM

## 2018-05-07 LAB — CBC
HEMATOCRIT: 37.3 % (ref 34.0–46.6)
Hemoglobin: 11.7 g/dL (ref 11.1–15.9)
MCH: 26.6 pg (ref 26.6–33.0)
MCHC: 31.4 g/dL — ABNORMAL LOW (ref 31.5–35.7)
MCV: 85 fL (ref 79–97)
Platelets: 257 10*3/uL (ref 150–450)
RBC: 4.4 x10E6/uL (ref 3.77–5.28)
RDW: 12.8 % (ref 12.3–15.4)
WBC: 3.8 10*3/uL (ref 3.4–10.8)

## 2018-05-07 LAB — COMPREHENSIVE METABOLIC PANEL
ALT: 18 IU/L (ref 0–32)
AST: 15 IU/L (ref 0–40)
Albumin/Globulin Ratio: 1.4 (ref 1.2–2.2)
Albumin: 3.9 g/dL (ref 3.6–4.8)
Alkaline Phosphatase: 56 IU/L (ref 39–117)
BUN / CREAT RATIO: 32 — AB (ref 12–28)
BUN: 19 mg/dL (ref 8–27)
Bilirubin Total: 0.4 mg/dL (ref 0.0–1.2)
CALCIUM: 9.3 mg/dL (ref 8.7–10.3)
CO2: 24 mmol/L (ref 20–29)
Chloride: 102 mmol/L (ref 96–106)
Creatinine, Ser: 0.59 mg/dL (ref 0.57–1.00)
GFR calc Af Amer: 108 mL/min/{1.73_m2} (ref >59)
GFR calc non Af Amer: 94 mL/min/{1.73_m2} (ref >59)
Globulin, Total: 2.7 g/dL (ref 1.5–4.5)
Glucose: 94 mg/dL (ref 65–99)
Potassium: 3.8 mmol/L (ref 3.5–5.2)
Sodium: 138 mmol/L (ref 134–144)
Total Protein: 6.6 g/dL (ref 6.0–8.5)

## 2018-05-07 LAB — TSH: TSH: 0.608 u[IU]/mL (ref 0.450–4.500)

## 2018-07-24 ENCOUNTER — Encounter: Payer: Self-pay | Admitting: *Deleted

## 2019-02-02 DIAGNOSIS — Z23 Encounter for immunization: Secondary | ICD-10-CM | POA: Diagnosis not present

## 2019-02-06 ENCOUNTER — Other Ambulatory Visit: Payer: Self-pay

## 2019-02-06 ENCOUNTER — Encounter: Payer: Self-pay | Admitting: Podiatry

## 2019-02-06 ENCOUNTER — Ambulatory Visit (INDEPENDENT_AMBULATORY_CARE_PROVIDER_SITE_OTHER): Payer: Medicare Other | Admitting: Podiatry

## 2019-02-06 DIAGNOSIS — Q828 Other specified congenital malformations of skin: Secondary | ICD-10-CM

## 2019-02-06 NOTE — Patient Instructions (Signed)

## 2019-02-10 NOTE — Progress Notes (Signed)
Subjective: Emily [name] presents to clinic with cc of painful lesion plantar aspect of left foot which are aggravated when weightbearing with and without shoe gear.  This pain limits her daily activities. Pain symptoms resolve with periodic professional debridement.  She states she will be traveling to [place] to take care of her mother for a week.   Shawnee Knapp, MD is her PCP.   Current Outpatient Medications on File Prior to Visit  Medication Sig Dispense Refill  . calcium carbonate (OSCAL) 1500 (600 Ca) MG TABS tablet Take by mouth 2 (two) times daily with a meal.    . ferrous sulfate 325 (65 FE) MG tablet Take 325 mg by mouth daily with breakfast.    . Multiple Vitamins-Minerals (EYE VITAMINS PO) Take by mouth.    . triamcinolone cream (KENALOG) 0.1 % Apply 1 application topically 2 (two) times daily as needed. 453.6 g 0  . Vitamin D, Cholecalciferol, 25 MCG (1000 UT) CAPS Take by mouth.     No current facility-administered medications on file prior to visit.      Allergies  Allergen Reactions  . Latex Itching and Rash   Objective:  Physical Examination:  Vascular  Examination: Capillary refill time immediate x 10 digits.  Palpable DP/PT pulses b/l.  Digital hair nonpalpable b/l.  No edema noted b/l.  Skin temperature gradient WNL b/l.  Dermatological Examination: Skin with normal turgor, texture and tone b/l.  No open wounds b/l.  No interdigital macerations noted b/l.  Toenails 1-5 b/l adequate length.   Porokeratotic lesion submet head 2 left foot with tenderness to palpation. No edema, no erythema, no drainage, no flocculence.   Musculoskeletal Examination: Muscle strength 5/5 to all muscle groups b/l.  No pain, crepitus or joint discomfort with active/passive ROM.  Neurological Examination: Sensation intact 5/5 b/l with 10 gram monofilament.  Vibratory sensation intact b/l.  Proprioceptive sensation intact b/l.  Assessment: 1. Porokeratosis  submet head 2 left foot  Plan: 1. Porokeratosis submet head 2 left foot pared and enucleated with sterile scalpel blade without incident. 2. Continue soft, supportive shoe gear daily. 3. Report any pedal injuries to medical professional. 4. Follow up 3 months. 5. Patient/POA to call should there be a question/concern in there interim.

## 2019-06-15 ENCOUNTER — Other Ambulatory Visit: Payer: Self-pay | Admitting: Family Medicine

## 2019-06-15 DIAGNOSIS — Z1231 Encounter for screening mammogram for malignant neoplasm of breast: Secondary | ICD-10-CM

## 2019-07-27 ENCOUNTER — Ambulatory Visit: Payer: Medicare Other

## 2019-07-31 ENCOUNTER — Telehealth: Payer: Self-pay

## 2019-07-31 NOTE — Telephone Encounter (Signed)
NOTES FROM [place], [place] [phone].  SENT REFERRAL TO SCHEDULING

## 2019-08-05 ENCOUNTER — Other Ambulatory Visit: Payer: Self-pay

## 2019-08-05 ENCOUNTER — Ambulatory Visit: Payer: Medicare Other | Admitting: Podiatry

## 2019-08-05 DIAGNOSIS — L97512 Non-pressure chronic ulcer of other part of right foot with fat layer exposed: Secondary | ICD-10-CM

## 2019-08-05 MED ORDER — GENTAMICIN SULFATE 0.1 % EX CREA: 1.0000 "application " | TOPICAL_CREAM | Freq: Two times a day (BID) | CUTANEOUS | 1 refills | Status: DC

## 2019-08-09 NOTE — Progress Notes (Signed)
   HPI: 69 y.o. female presenting today with a chief complaint of a wound noted to the right big toe that she noticed yesterday. She was getting a pedicure yesterday when she noticed the skin had opened up. She denies modifying factors. She has a bandage on the wound for treatment. Patient is here for further evaluation and treatment.   Past Medical History:  Diagnosis Date  . Anemia    iron runs low  . Anxiety   . Bronchitis   . Cataract   . History of chicken pox   . History of uterine fibroid   . Nontraumatic tear of skin    right side of vagina skin since 06-12-15, small amount of blood when wipes  . Tuberculosis    exposed as a young child     Physical Exam: General: The patient is alert and oriented x3 in no acute distress.  Dermatology: Wound #1 noted to the right great toe measuring 0.8 x 0.8 x 0.2 cm.   To the above-noted ulceration, there is no eschar. There is a moderate amount of slough, fibrin and necrotic tissue. Granulation tissue and wound base is red. There is no malodor. There is a minimal amount of serosanginous drainage noted. Periwound integrity is intact.  Skin is warm, dry and supple bilateral lower extremities.  Vascular: Palpable pedal pulses bilaterally. No edema or erythema noted. Capillary refill within normal limits.  Neurological: Epicritic and protective threshold grossly intact bilaterally.   Musculoskeletal Exam: Range of motion within normal limits to all pedal and ankle joints bilateral. Muscle strength 5/5 in all groups bilateral.    Assessment: 1. Ulceration of the right great toe   Plan of Care:  1. Patient evaluated.  2. Medically necessary excisional debridement including subcutaneous tissue was performed using a tissue nipper and a chisel blade. Excisional debridement of all the necrotic nonviable tissue down to healthy bleeding viable tissue was performed with post-debridement measurements same as pre-. 3. The wound was cleansed and  dry sterile dressing applied. 4. Prescription for Gentamicin cream provided to patient to use daily with a bandage.  5. Post op shoe dispensed.  6. Return to clinic at next scheduled appointment.    Daughter is a Psychiatric nurse.      Edrick Kins, DPM [facility]  Dr. Edrick Kins, DPM    [address],  [address]                Office [phone]  Fax [phone]

## 2019-08-11 ENCOUNTER — Encounter: Payer: Self-pay | Admitting: Podiatry

## 2019-08-11 ENCOUNTER — Ambulatory Visit: Payer: Medicare Other | Admitting: Podiatry

## 2019-08-11 ENCOUNTER — Other Ambulatory Visit: Payer: Self-pay

## 2019-08-11 VITALS — Temp 96.4°F

## 2019-08-11 DIAGNOSIS — M79672 Pain in left foot: Secondary | ICD-10-CM

## 2019-08-11 DIAGNOSIS — L84 Corns and callosities: Secondary | ICD-10-CM | POA: Diagnosis not present

## 2019-08-11 DIAGNOSIS — M79671 Pain in right foot: Secondary | ICD-10-CM | POA: Diagnosis not present

## 2019-08-11 DIAGNOSIS — Q828 Other specified congenital malformations of skin: Secondary | ICD-10-CM | POA: Diagnosis not present

## 2019-08-11 DIAGNOSIS — R7303 Prediabetes: Secondary | ICD-10-CM

## 2019-08-11 NOTE — Patient Instructions (Addendum)
Diabetes Mellitus and Foot Care Foot care is an important part of your health, especially when you have diabetes. Diabetes may cause you to have problems because of poor blood flow (circulation) to your feet and legs, which can cause your skin to:  Become thinner and drier.  Break more easily.  Heal more slowly.  Peel and crack. You may also have nerve damage (neuropathy) in your legs and feet, causing decreased feeling in them. This means that you may not notice minor injuries to your feet that could lead to more serious problems. Noticing and addressing any potential problems early is the best way to prevent future foot problems. How to care for your feet Foot hygiene  Wash your feet daily with warm water and mild soap. Do not use hot water. Then, pat your feet and the areas between your toes until they are completely dry. Do not soak your feet as this can dry your skin.  Trim your toenails straight across. Do not dig under them or around the cuticle. File the edges of your nails with an emery board or nail file.  Apply a moisturizing lotion or petroleum jelly to the skin on your feet and to dry, brittle toenails. Use lotion that does not contain alcohol and is unscented. Do not apply lotion between your toes. Shoes and socks  Wear clean socks or stockings every day. Make sure they are not too tight. Do not wear knee-high stockings since they may decrease blood flow to your legs.  Wear shoes that fit properly and have enough cushioning. Always look in your shoes before you put them on to be sure there are no objects inside.  To break in new shoes, wear them for just a few hours a day. This prevents injuries on your feet. Wounds, scrapes, corns, and calluses  Check your feet daily for blisters, cuts, bruises, sores, and redness. If you cannot see the bottom of your feet, use a mirror or ask someone for help.  Do not cut corns or calluses or try to remove them with medicine.  If you  find a minor scrape, cut, or break in the skin on your feet, keep it and the skin around it clean and dry. You may clean these areas with mild soap and water. Do not clean the area with peroxide, alcohol, or iodine.  If you have a wound, scrape, corn, or callus on your foot, look at it several times a day to make sure it is healing and not infected. Check for: ? Redness, swelling, or pain. ? Fluid or blood. ? Warmth. ? Pus or a bad smell. General instructions  Do not cross your legs. This may decrease blood flow to your feet.  Do not use heating pads or hot water bottles on your feet. They may burn your skin. If you have lost feeling in your feet or legs, you may not know this is happening until it is too late.  Protect your feet from hot and cold by wearing shoes, such as at the beach or on hot pavement.  Schedule a complete foot exam at least once a year (annually) or more often if you have foot problems. If you have foot problems, report any cuts, sores, or bruises to your health care provider immediately. Contact a health care provider if:  You have a medical condition that increases your risk of infection and you have any cuts, sores, or bruises on your feet.  You have an injury that is not   healing.  You have redness on your legs or feet.  You feel burning or tingling in your legs or feet.  You have pain or cramps in your legs and feet.  Your legs or feet are numb.  Your feet always feel cold.  You have pain around a toenail. Get help right away if:  You have a wound, scrape, corn, or callus on your foot and: ? You have pain, swelling, or redness that gets worse. ? You have fluid or blood coming from the wound, scrape, corn, or callus. ? Your wound, scrape, corn, or callus feels warm to the touch. ? You have pus or a bad smell coming from the wound, scrape, corn, or callus. ? You have a fever. ? You have a red line going up your leg. Summary  Check your feet every day  for cuts, sores, red spots, swelling, and blisters.  Moisturize feet and legs daily.  Wear shoes that fit properly and have enough cushioning.  If you have foot problems, report any cuts, sores, or bruises to your health care provider immediately.  Schedule a complete foot exam at least once a year (annually) or more often if you have foot problems. This information is not intended to replace advice given to you by your health care provider. Make sure you discuss any questions you have with your health care provider. Document Revised: 01/21/2019 Document Reviewed: 06/01/2016 Elsevier Patient Education  2020 Else[employer] Inc.  Corns and Calluses Corns are small areas of thickened skin that occur on the top, sides, or tip of a toe. They contain a cone-shaped core with a point that can press on a nerve below. This causes pain.  Calluses are areas of thickened skin that can occur anywhere on the body, including the hands, fingers, palms, soles of the feet, and heels. Calluses are usually larger than corns. What are the causes? Corns and calluses are caused by rubbing (friction) or pressure, such as from shoes that are too tight or do not fit properly. What increases the risk? Corns are more likely to develop in people who have misshapen toes (toe deformities), such as hammer toes. Calluses can occur with friction to any area of the skin. They are more likely to develop in people who:  Work with their hands.  Wear shoes that fit poorly, are too tight, or are high-heeled.  Have toe deformities. What are the signs or symptoms? Symptoms of a corn or callus include:  A hard growth on the skin.  Pain or tenderness under the skin.  Redness and swelling.  Increased discomfort while wearing tight-fitting shoes, if your feet are affected. If a corn or callus becomes infected, symptoms may include:  Redness and swelling that gets worse.  Pain.  Fluid, blood, or pus draining from the corn or  callus. How is this diagnosed? Corns and calluses may be diagnosed based on your symptoms, your medical history, and a physical exam. How is this treated? Treatment for corns and calluses may include:  Removing the cause of the friction or pressure. This may involve: ? Changing your shoes. ? Wearing shoe inserts (orthotics) or other protective layers in your shoes, such as a corn pad. ? Wearing gloves.  Applying medicine to the skin (topical medicine) to help soften skin in the hardened, thickened areas.  Removing layers of dead skin with a file to reduce the size of the corn or callus.  Removing the corn or callus with a scalpel or laser.  Taking   antibiotic medicines, if your corn or callus is infected.  Having surgery, if a toe deformity is the cause. Follow these instructions at home:   Take over-the-counter and prescription medicines only as told by your health care provider.  If you were prescribed an antibiotic, take it as told by your health care provider. Do not stop taking it even if your condition starts to improve.  Wear shoes that fit well. Avoid wearing high-heeled shoes and shoes that are too tight or too loose.  Wear any padding, protective layers, gloves, or orthotics as told by your health care provider.  Soak your hands or feet and then use a file or pumice stone to soften your corn or callus. Do this as told by your health care provider.  Check your corn or callus every day for symptoms of infection. Contact a health care provider if you:  Notice that your symptoms do not improve with treatment.  Have redness or swelling that gets worse.  Notice that your corn or callus becomes painful.  Have fluid, blood, or pus coming from your corn or callus.  Have new symptoms. Summary  Corns are small areas of thickened skin that occur on the top, sides, or tip of a toe.  Calluses are areas of thickened skin that can occur anywhere on the body, including the  hands, fingers, palms, and soles of the feet. Calluses are usually larger than corns.  Corns and calluses are caused by rubbing (friction) or pressure, such as from shoes that are too tight or do not fit properly.  Treatment may include wearing any padding, protective layers, gloves, or orthotics as told by your health care provider. This information is not intended to replace advice given to you by your health care provider. Make sure you discuss any questions you have with your health care provider. Document Revised: 08/20/2018 Document Reviewed: 03/13/2017 Elsevier Patient Education  2020 Elsevier Inc.  Onychomycosis/Fungal Toenails  WHAT IS IT? An infection that lies within the keratin of your nail plate that is caused by a fungus.  WHY ME? Fungal infections affect all ages, sexes, races, and creeds.  There may be many factors that predispose you to a fungal infection such as age, coexisting medical conditions such as diabetes, or an autoimmune disease; stress, medications, fatigue, genetics, etc.  Bottom line: fungus thrives in a warm, moist environment and your shoes offer such a location.  IS IT CONTAGIOUS? Theoretically, yes.  You do not want to share shoes, nail clippers or files with someone who has fungal toenails.  Walking around barefoot in the same room or sleeping in the same bed is unlikely to transfer the organism.  It is important to realize, however, that fungus can spread easily from one nail to the next on the same foot.  HOW DO WE TREAT THIS?  There are several ways to treat this condition.  Treatment may depend on many factors such as age, medications, pregnancy, liver and kidney conditions, etc.  It is best to ask your doctor which options are available to you.  8. No treatment.   Unlike many other medical concerns, you can live with this condition.  However for many people this can be a painful condition and may lead to ingrown toenails or a bacterial infection.  It is  recommended that you keep the nails cut short to help reduce the amount of fungal nail. 9. Topical treatment.  These range from herbal remedies to prescription strength nail lacquers.  About 40-50%   effective, topicals require twice daily application for approximately 9 to 12 months or until an entirely new nail has grown out.  The most effective topicals are medical grade medications available through physicians offices. 10. Oral antifungal medications.  With an 80-90% cure rate, the most common oral medication requires 3 to 4 months of therapy and stays in your system for a year as the new nail grows out.  Oral antifungal medications do require blood work to make sure it is a safe drug for you.  A liver function panel will be performed prior to starting the medication and after the first month of treatment.  It is important to have the blood work performed to avoid any harmful side effects.  In general, this medication safe but blood work is required. 11. Laser Therapy.  This treatment is performed by applying a specialized laser to the affected nail plate.  This therapy is noninvasive, fast, and non-painful.  It is not covered by insurance and is therefore, out of pocket.  The results have been very good with a 80-95% cure rate.  The [facility] is the only practice in the area to offer this therapy. 12. Permanent Nail Avulsion.  Removing the entire nail so that a new nail will not grow back. 

## 2019-08-11 NOTE — Progress Notes (Signed)
Subjective: Emily [name] presents today for follow up of corn(s) right 5th digit  which interfere(s) with ambulation. Aggravating factors include wearing enclosed shoe gear. Pain is relieved with periodic professional debridement. and painful plantar lesions plantar aspect of both feet.  Pain prevent comfortable ambulation. Aggravating factor is weightbearing with or without shoegear.   Allergies  Allergen Reactions  . Latex Itching and Rash     Objective: Vitals:   08/11/19 0833  Temp: (!) 96.4 F (35.8 C)    Pt 69 y.o. year old AA female WD, WN in NAD. AAO x 3.   Vascular Examination:  Capillary refill time to digits immediate b/l. Palpable DP pulses b/l. Palpable PT pulses b/l. Pedal hair absent b/l Skin temperature gradient within normal limits b/l. No edema noted b/l.  Dermatological Examination: Pedal skin with normal turgor, texture and tone bilaterally. No open wounds bilaterally. No interdigital macerations bilaterally. Anonychia noted L hallux and R hallux. Nailbed(s) epithelialized.  Right great toe ulceration healed dorsal nailbed. Hyperkeratotic lesion(s) R 5th toe.  No erythema, no edema, no drainage, no flocculence. Porokeratotic lesion(s) submet head 2 left foot and submet head 3 right foot. No erythema, no edema, no drainage, no flocculence.  Musculoskeletal: Normal muscle strength 5/5 to all lower extremity muscle groups bilaterally, no gross bony deformities bilaterally and no pain crepitus or joint limitation noted with ROM b/l  Neurological: Protective sensation intact 5/5 intact bilaterally with 10g monofilament b/l Vibratory sensation intact b/l  Assessment: 1. Corns   2. Porokeratosis   3. Pain in both feet   4. Prediabetes    Plan: -Corn(s) debrided R 5th toe without complication or incident. Total number debrided=1. -Painful porokeratotic lesion(s) submet head 2 left foot and submet head 3 right foot pared and enucleated with sterile scalpel blade  without incident. -Patient to continue soft, supportive shoe gear daily. -Patient to report any pedal injuries to medical professional immediately. -Patient/POA to call should there be question/concern in the interim.  Return if symptoms worsen or fail to improve, for diabetic nail and callus trim.

## 2019-08-18 ENCOUNTER — Ambulatory Visit
Admission: RE | Admit: 2019-08-18 | Discharge: 2019-08-18 | Disposition: A | Discharge status: Home / Self Care | Payer: Medicare Other | Source: Ambulatory Visit | Attending: Family Medicine | Admitting: Family Medicine

## 2019-08-18 ENCOUNTER — Other Ambulatory Visit: Payer: Self-pay

## 2019-08-18 DIAGNOSIS — Z1231 Encounter for screening mammogram for malignant neoplasm of breast: Secondary | ICD-10-CM

## 2019-08-19 ENCOUNTER — Telehealth: Payer: Self-pay | Admitting: *Deleted

## 2019-08-19 NOTE — Telephone Encounter (Signed)
Patient called and stated that she was in the office about 2 weeks ago and had something done to the nail and just wanted to know how to take care of it and how long and I left a message for the patient to call me back at the [facility] office [phone]. Lattie Haw

## 2019-08-31 ENCOUNTER — Ambulatory Visit: Payer: Medicare Other | Admitting: Podiatry

## 2019-09-04 ENCOUNTER — Ambulatory Visit (INDEPENDENT_AMBULATORY_CARE_PROVIDER_SITE_OTHER): Payer: Medicare Other

## 2019-09-04 ENCOUNTER — Encounter: Payer: Self-pay | Admitting: Podiatry

## 2019-09-04 ENCOUNTER — Ambulatory Visit: Payer: Medicare Other | Admitting: Podiatry

## 2019-09-04 ENCOUNTER — Other Ambulatory Visit: Payer: Self-pay

## 2019-09-04 VITALS — Temp 95.5°F

## 2019-09-04 DIAGNOSIS — M779 Enthesopathy, unspecified: Secondary | ICD-10-CM

## 2019-09-04 DIAGNOSIS — M778 Other enthesopathies, not elsewhere classified: Secondary | ICD-10-CM | POA: Diagnosis not present

## 2019-09-04 DIAGNOSIS — M79672 Pain in left foot: Secondary | ICD-10-CM

## 2019-09-04 DIAGNOSIS — M25572 Pain in left ankle and joints of left foot: Secondary | ICD-10-CM

## 2019-09-07 NOTE — Progress Notes (Signed)
Subjective:   Patient ID: Emily [name], female   DOB: 69 y.o.   MRN: [id number]   HPI Patient presents stating my left ankle has been bothering me for around a month and I do not remember specific injury.  I had some drainage of my right big toenail it is not occurring currently but I would like to get it checked to make sure there is no issues states that she is not significantly ambulatory currently   ROS      Objective:  Physical Exam  Neurovascular status unchanged with patient found to have relatively reduced range of motion subtalar midtarsal joint and muscle strength.  Patient is found to have mild to moderate inflammation sinus tarsi left that is not swollen currently and has reduced and on the right there was some discoloration of the nail but no active drainage noted     Assessment:  Sinus tarsitis left with inflammatory changes and probable arthritis along with nail disease right hallux     Plan:  H&P reviewed condition and at this point I have recommended sinus tarsi injection but patient refuses this so I discussed topicals he can she could use along with bracing supportive shoe and for the right soaks but I do not recommend any antibiotics currently.  Patient will be seen back to recheck  X-rays were negative for indications that there is pathology of an acute nature ankle with chronic arthritis no indication of stress fracture or advanced arthritis

## 2019-09-08 ENCOUNTER — Other Ambulatory Visit: Payer: Self-pay

## 2019-09-08 ENCOUNTER — Encounter: Payer: Self-pay | Admitting: Cardiology

## 2019-09-08 ENCOUNTER — Ambulatory Visit: Payer: Medicare Other | Admitting: Cardiology

## 2019-09-08 VITALS — BP 110/70 | HR 77 | Ht 62.25 in | Wt 149.0 lb

## 2019-09-08 DIAGNOSIS — E7800 Pure hypercholesterolemia, unspecified: Secondary | ICD-10-CM

## 2019-09-08 DIAGNOSIS — R079 Chest pain, unspecified: Secondary | ICD-10-CM | POA: Diagnosis not present

## 2019-09-08 NOTE — Progress Notes (Signed)
Cardiology Office Note:    Date:  09/08/2019   ID:  Emily [name], DOB Nov 12, 1950, MRN [id number]  PCP:  Emily Brasil, FNP  Cardiologist:  Emily Furbish, MD  Electrophysiologist:  None   Referring MD: Emily Brasil, FNP     History of Present Illness:    Emily [name] is a 69 y.o. female here for the evaluation of recurrent chest tightness at the request of Dr. Assunta Found.  In review of outpatient clinic note she related chest tightness when dealing with stressful situations when lying down.  No vomiting no sweating.  Stress stems from dealing with her son's insecurity anxiety has resolved since returning to work she states.  Sometimes she will have palpitations or fast heart rate and fatigue and lack of sleep.  Last chest pain with son as above. Helping with his 2 children. Paying for clothes. Stressful. Like a chocking sensation. Like trouble swallowing. Comes on more often now.   Weight has come back. Been to foot MD. Wearing ankle brace  Mother heart condition but she is 95.   Has been on atorvastatin 10 mg recently started.  She has felt some mild tightness when walking periodically.  She thinks that is because she is out of shape at times.  Going to [place] to be with her daughter for 2 months. 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 Daron Offer.    Been having some recent leg pain bilaterally.  Worried about potential blood flow issues.  It is difficult for me to feel distal pulses. Recent weight gain  Past Medical History:  Diagnosis Date  . Anemia    iron runs low  . Anxiety   . Anxiety state, unspecified 11/21/2012  . Breast fibrocystic disorder 11/21/2012  . Bronchitis   . Cataract   . Chest pain 11/21/2012   Normal stress echocardiogram 11/2010   . History of chicken pox   . History of uterine fibroid   . Incidental lung nodule, > 1mm and < 75mm 02/15/2012   71mm RUL subpleural nodule found on CXR screening for TB due to h/o TB exposure as a  child with positive ppd.  Recheck CT scan in 6-12 mos (March - Sept 2014) Repeat CT scan 01/2013 shows: Stable subpleural nodule in the right upper lobe measuring 5 mm. If this patient is at low risk for lung cancer then no further workup is necessary.  If this patient is at increased risk for lung cancer then foll  . Nontraumatic tear of skin    right side of vagina skin since 06-12-15, small amount of blood when wipes  . Prediabetes 11/21/2012  . Tuberculosis    exposed as a young child    Past Surgical History:  Procedure Laterality Date  . COLONOSCOPY WITH PROPOFOL N/A 04/24/2016   Procedure: COLONOSCOPY WITH PROPOFOL;  Surgeon: Garlan Fair, MD;  Location: [facility];  Service: Endoscopy;  Laterality: N/A;  . EYE SURGERY Bilateral    lens replacements  . FRACTURE SURGERY Right 2003   fibula  . surgery for fibroids  2000    Current Medications: Current Meds  Medication Sig  . atorvastatin (LIPITOR) 10 MG tablet Take 10 mg by mouth daily.  . calcium carbonate (OSCAL) 1500 (600 Ca) MG TABS tablet Take by mouth 2 (two) times daily with a meal.  . ferrous sulfate 325 (65 FE) MG tablet Take 325 mg by mouth daily with breakfast.  . gentamicin cream (GARAMYCIN) 0.1 % Apply  1 application topically 2 (two) times daily.  . Multiple Vitamins-Minerals (EYE VITAMINS PO) Take by mouth.  . triamcinolone cream (KENALOG) 0.1 % Apply 1 application topically 2 (two) times daily as needed.  . Vitamin D, Cholecalciferol, 25 MCG (1000 UT) CAPS Take by mouth.     Allergies:   Latex   Social History   Socioeconomic History  . Marital status: Divorced    Spouse name: Not on file  . Number of children: Not on file  . Years of education: Not on file  . Highest education level: Not on file  Occupational History  . Not on file  Tobacco Use  . Smoking status: Never Smoker  . Smokeless tobacco: Never Used  Substance and Sexual Activity  . Alcohol use: No  . Drug use: No  . Sexual activity:  Never  Other Topics Concern  . Not on file  Social History Narrative  . Not on file   Social Determinants of Health   Financial Resource Strain:   . Difficulty of Paying Living Expenses:   Food Insecurity:   . Worried About Charity fundraiser in the Last Year:   . Arboriculturist in the Last Year:   Transportation Needs:   . Film/video editor (Medical):   Marland Kitchen Lack of Transportation (Non-Medical):   Physical Activity:   . Days of Exercise per Week:   . Minutes of Exercise per Session:   Stress:   . Feeling of Stress :   Social Connections:   . Frequency of Communication with Friends and Family:   . Frequency of Social Gatherings with Friends and Family:   . Attends Religious Services:   . Active Member of Clubs or Organizations:   . Attends Archivist Meetings:   Marland Kitchen Marital Status:      Family History: The patient's family history includes Alcohol abuse in her brother, brother, and father; Arthritis in her mother; COPD (age of onset: 48) in her father; Depression in her paternal grandmother; Diabetes in her brother; Gout in her father; Hypertension in her brother, brother, and father; Mental illness in her brother; Stroke in her maternal grandmother.  ROS:   Please see the history of present illness.    Denies any fevers chills nausea vomiting syncope all other systems reviewed and are negative.  EKGs/Labs/Other Studies Reviewed:    The following studies were reviewed today: Prior office notes reviewed  EKG:  EKG is  ordered today.  The ekg ordered today demonstrates sinus rhythm 77 with no other abnormalities  Recent Labs: No results found for requested labs within last 8760 hours.  Recent Lipid Panel    Component Value Date/Time   CHOL 199 02/23/2016 0918   TRIG 41 02/23/2016 0918   HDL 97 02/23/2016 0918   CHOLHDL 2.1 02/23/2016 0918   VLDL 8 02/23/2016 0918   LDLCALC 94 02/23/2016 0918    Physical Exam:    VS:  BP 110/70   Pulse 77   Ht 5'  2.25" (1.581 m)   Wt 149 lb (67.6 kg)   BMI 27.03 kg/m     Wt Readings from Last 3 Encounters:  09/08/19 149 lb (67.6 kg)  05/06/18 131 lb 9.6 oz (59.7 kg)  10/02/17 123 lb (55.8 kg)     GEN:  Well nourished, well developed in no acute distress HEENT: Normal NECK: No JVD; No carotid bruits LYMPHATICS: No lymphadenopathy CARDIAC: RRR, no murmurs, rubs, gallops RESPIRATORY:  Clear to auscultation without  rales, wheezing or rhonchi  ABDOMEN: Soft, non-tender, non-distended MUSCULOSKELETAL:  No edema; No deformity, wearing ankle brace SKIN: Warm and dry NEUROLOGIC:  Alert and oriented x 3 PSYCHIATRIC:  Normal affect   ASSESSMENT:    1. Chest pain, unspecified type   2. Pure hypercholesterolemia    PLAN:    In order of problems listed above:  Chest discomfort/angina -We will go ahead and check a CT scan with possible FFR analysis for further evaluation of her coronary arteries.  She has both typical and atypical symptoms.  She states that she will be out of town for the next 2 months and therefore she will call us back when she is ready to schedule.  I think this would be a great test for her to see if we need to impact her medical therapy.  Hyperlipidemia -Continue with atorvastatin 10 mg.  Excellent. -LDL 122  Leg pain -Bilateral, I will go ahead and check lower extremity arterial Dopplers to make sure there is no evidence of blood flow issues.   Medication Adjustments/Labs and Tests Ordered: Current medicines are reviewed at length with the patient today.  Concerns regarding medicines are outlined above.  Orders Placed This Encounter  Procedures  . EKG 12-Lead   No orders of the defined types were placed in this encounter.   Patient Instructions  Medication Instructions:  The current medical regimen is effective;  continue present plan and medications.  *If you need a refill on your cardiac medications before your next appointment, please call your  pharmacy*  Testing/Procedures: Your physician has requested that you have Coronary CT. Cardiac computed tomography (CT) is a painless test that uses an x-ray machine to take clear, detailed pictures of your heart.  Your physician has requested that you have a lower extremity arterial duplex. This test is an ultrasound of the arteries in the legs or arms. It looks at arterial blood flow in the legs and arms. Allow one hour for Lower and Upper Arterial scans. There are no restrictions or special instructions   Please let us know when you return so that this testing can be ordered and scheduled.  Follow-Up: Follow up will be determined after the above testing has been completed.  At [facility], you and your health needs are our priority.  As part of our continuing mission to provide you with exceptional heart care, we have created designated Provider Care Teams.  These Care Teams include your primary Cardiologist (physician) and Advanced Practice Providers (APPs -  Physician Assistants and Nurse Practitioners) who all work together to provide you with the care you need, when you need it.  We recommend signing up for the patient portal called "MyChart".  Sign up information is provided on this After Visit Summary.  MyChart is used to connect with patients for Virtual Visits (Telemedicine).  Patients are able to view lab/test results, encounter notes, upcoming appointments, etc.  Non-urgent messages can be sent to your provider as well.   To learn more about what you can do with MyChart, go to [email or web address].    Thank you for choosing [facility]!!         Signed, Emily Furbish, MD  09/08/2019 10:26 AM    [facility]

## 2019-09-08 NOTE — Patient Instructions (Addendum)
Medication Instructions:  The current medical regimen is effective;  continue present plan and medications.  *If you need a refill on your cardiac medications before your next appointment, please call your pharmacy*  Testing/Procedures: Your physician has requested that you have Coronary CT. Cardiac computed tomography (CT) is a painless test that uses an x-ray machine to take clear, detailed pictures of your heart.  Your physician has requested that you have a lower extremity arterial duplex. This test is an ultrasound of the arteries in the legs or arms. It looks at arterial blood flow in the legs and arms. Allow one hour for Lower and Upper Arterial scans. There are no restrictions or special instructions   Please let us know when you return so that this testing can be ordered and scheduled.  Follow-Up: Follow up will be determined after the above testing has been completed.  At [facility], you and your health needs are our priority.  As part of our continuing mission to provide you with exceptional heart care, we have created designated Provider Care Teams.  These Care Teams include your primary Cardiologist (physician) and Advanced Practice Providers (APPs -  Physician Assistants and Nurse Practitioners) who all work together to provide you with the care you need, when you need it.  We recommend signing up for the patient portal called "MyChart".  Sign up information is provided on this After Visit Summary.  MyChart is used to connect with patients for Virtual Visits (Telemedicine).  Patients are able to view lab/test results, encounter notes, upcoming appointments, etc.  Non-urgent messages can be sent to your provider as well.   To learn more about what you can do with MyChart, go to [email or web address].    Thank you for choosing [facility]!!

## 2019-09-24 ENCOUNTER — Other Ambulatory Visit: Payer: Self-pay | Admitting: Podiatry

## 2019-09-24 DIAGNOSIS — M779 Enthesopathy, unspecified: Secondary | ICD-10-CM

## 2019-12-02 ENCOUNTER — Telehealth: Payer: Self-pay | Admitting: Cardiology

## 2019-12-02 DIAGNOSIS — I739 Peripheral vascular disease, unspecified: Secondary | ICD-10-CM

## 2019-12-02 DIAGNOSIS — I208 Other forms of angina pectoris: Secondary | ICD-10-CM

## 2019-12-02 NOTE — Telephone Encounter (Signed)
Left message to c/b.

## 2019-12-02 NOTE — Telephone Encounter (Signed)
Patient states she is calling to inquire about what the plan will be moving forward. She states during her appointment on 09/08/19 Dr. Marlou Porch discussed further testing due to ankle swelling she was experiencing. Please call to discuss.

## 2019-12-02 NOTE — Telephone Encounter (Signed)
Left message for pt to c/b.  At last office visit Dr Marlou Porch wanted the pt to be scheduled for Coronary CT with possible FFR for eval of chest pain and lower extremity arterial doppler with ABIs for leg pain and distant pulses. Pt was to call back once she returned from from [place], visiting her daughter, to schedule.

## 2019-12-02 NOTE — Telephone Encounter (Signed)
Patient called to return Pam's call. Pam stated she will call the patient back, patient advised. Please call.

## 2019-12-03 NOTE — Telephone Encounter (Signed)
Emily [name] is returning Pamela's call.

## 2019-12-03 NOTE — Telephone Encounter (Signed)
Left message to call office

## 2019-12-11 ENCOUNTER — Telehealth: Payer: Self-pay | Admitting: Cardiology

## 2019-12-11 NOTE — Telephone Encounter (Signed)
Spoke with pt and advised Dr Marlou Porch RN was calling regarding scheduling testing as previously requested by Dr Marlou Porch.  Pt states she is ready to move forward.  Pt advised orders will be placed for Coronary CT and lower extremity Doppler study.  Pt advised she will be contacted re: scheduling.  Pt verbalizes understanding and agrees with current plan.  Orders placed as requested by Dr Marlou Porch and message sent to precert and [facility].

## 2019-12-11 NOTE — Telephone Encounter (Signed)
Left message for pt to contact triage RN at [phone].

## 2019-12-11 NOTE — Telephone Encounter (Signed)
Emily [name] is returning Emily [name]'s call.

## 2019-12-11 NOTE — Addendum Note (Signed)
Addended by: Thora Lance on: 12/11/2019 12:06 PM   Modules accepted: Orders

## 2019-12-11 NOTE — Telephone Encounter (Signed)
     I went in pt chart to see who had called her this morning. I transferred the call to Hannah Beat, he took the call.

## 2019-12-14 ENCOUNTER — Telehealth: Payer: Self-pay | Admitting: Cardiology

## 2019-12-14 NOTE — Telephone Encounter (Signed)
Patient is calling stating Rosann Auerbach called her regarding scheduling a scan. Messaged Rosann Auerbach, she isn't sure what the patient is referring to. Patient stated she will check her messages to see the scan that was mentioned and will call back.

## 2019-12-15 NOTE — Telephone Encounter (Signed)
Attempted phone call to pt.  Left voicemail message to contact RN at [phone]. 

## 2019-12-24 ENCOUNTER — Other Ambulatory Visit (HOSPITAL_COMMUNITY): Payer: Self-pay | Admitting: Cardiology

## 2019-12-24 DIAGNOSIS — I739 Peripheral vascular disease, unspecified: Secondary | ICD-10-CM

## 2019-12-31 ENCOUNTER — Other Ambulatory Visit: Payer: Self-pay

## 2019-12-31 ENCOUNTER — Ambulatory Visit (HOSPITAL_COMMUNITY)
Admission: RE | Admit: 2019-12-31 | Discharge: 2019-12-31 | Disposition: A | Discharge status: Home / Self Care | Payer: Medicare Other | Source: Ambulatory Visit | Attending: Cardiology | Admitting: Cardiology

## 2019-12-31 DIAGNOSIS — I208 Other forms of angina pectoris: Secondary | ICD-10-CM | POA: Insufficient documentation

## 2019-12-31 DIAGNOSIS — I739 Peripheral vascular disease, unspecified: Secondary | ICD-10-CM | POA: Diagnosis not present

## 2020-01-01 ENCOUNTER — Telehealth: Payer: Self-pay | Admitting: Cardiology

## 2020-01-01 NOTE — Telephone Encounter (Signed)
New message:    Patient calling stating that some one called her I did not see a note. Please call patient.

## 2020-01-01 NOTE — Telephone Encounter (Signed)
Left message of results on pt's vm and requested she c/b if any questions or concerns.

## 2020-01-01 NOTE — Telephone Encounter (Signed)
Stable lower extremity Dopplers. No change from prior. Continue with exercise.  Candee Furbish, MD

## 2020-02-19 ENCOUNTER — Encounter (HOSPITAL_COMMUNITY): Payer: Self-pay | Admitting: Emergency Medicine

## 2020-02-19 ENCOUNTER — Ambulatory Visit (HOSPITAL_COMMUNITY)
Admission: EM | Admit: 2020-02-19 | Discharge: 2020-02-19 | Disposition: A | Discharge status: Home / Self Care | Payer: Medicare Other | Attending: Internal Medicine | Admitting: Internal Medicine

## 2020-02-19 ENCOUNTER — Other Ambulatory Visit: Payer: Self-pay

## 2020-02-19 DIAGNOSIS — U071 COVID-19: Secondary | ICD-10-CM | POA: Insufficient documentation

## 2020-02-19 DIAGNOSIS — Z8611 Personal history of tuberculosis: Secondary | ICD-10-CM | POA: Insufficient documentation

## 2020-02-19 DIAGNOSIS — R52 Pain, unspecified: Secondary | ICD-10-CM

## 2020-02-19 DIAGNOSIS — Z8709 Personal history of other diseases of the respiratory system: Secondary | ICD-10-CM | POA: Diagnosis not present

## 2020-02-19 DIAGNOSIS — J069 Acute upper respiratory infection, unspecified: Secondary | ICD-10-CM | POA: Diagnosis not present

## 2020-02-19 DIAGNOSIS — R519 Headache, unspecified: Secondary | ICD-10-CM | POA: Diagnosis not present

## 2020-02-19 DIAGNOSIS — Z79899 Other long term (current) drug therapy: Secondary | ICD-10-CM | POA: Insufficient documentation

## 2020-02-19 DIAGNOSIS — R7303 Prediabetes: Secondary | ICD-10-CM | POA: Insufficient documentation

## 2020-02-19 LAB — POCT URINALYSIS DIPSTICK, ED / UC
Glucose, UA: NEGATIVE mg/dL
Ketones, ur: 40 mg/dL — AB
Leukocytes,Ua: NEGATIVE
Nitrite: NEGATIVE
Protein, ur: 100 mg/dL — AB
Specific Gravity, Urine: 1.02 (ref 1.005–1.030)
Urobilinogen, UA: 0.2 mg/dL (ref 0.0–1.0)
pH: 6 (ref 5.0–8.0)

## 2020-02-19 NOTE — ED Notes (Signed)
PT attempted to collect urine but unable to at this time

## 2020-02-19 NOTE — ED Provider Notes (Signed)
[place]    CSN: [id number] Arrival date & time: 02/19/20  1406      History   Chief Complaint Chief Complaint  Patient presents with  . Headache  . Generalized Body Aches    HPI Gwendola Hornaday is a 69 y.o. female.   Patient presenting today with almost a week of body aches, feeling chills and sweats, headache, sore throat, rhinorrhea, and now also some nocturia and mild incontinence at times which is new for her. Denies rashes, N/V/D,abdominal pain, cough, CP, SOB. Has not been trying anything OTC for sxs. No known sick contacts.      Past Medical History:  Diagnosis Date  . Anemia    iron runs low  . Anxiety   . Anxiety state, unspecified 11/21/2012  . Breast fibrocystic disorder 11/21/2012  . Bronchitis   . Cataract   . Chest pain 11/21/2012   Normal stress echocardiogram 11/2010   . History of chicken pox   . History of uterine fibroid   . Incidental lung nodule, > 33mm and < 93mm 02/15/2012   52mm RUL subpleural nodule found on CXR screening for TB due to h/o TB exposure as a child with positive ppd.  Recheck CT scan in 6-12 mos (March - Sept 2014) Repeat CT scan 01/2013 shows: Stable subpleural nodule in the right upper lobe measuring 5 mm. If this patient is at low risk for lung cancer then no further workup is necessary.  If this patient is at increased risk for lung cancer then foll  . Nontraumatic tear of skin    right side of vagina skin since 06-12-15, small amount of blood when wipes  . Prediabetes 11/21/2012  . Tuberculosis    exposed as a young child    Patient Active Problem List   Diagnosis Date Noted  . Prediabetes 11/21/2012  . Breast fibrocystic disorder 11/21/2012  . Chest pain 11/21/2012  . Anxiety state, unspecified 11/21/2012  . Incidental lung nodule, > 30mm and < 42mm 02/15/2012    Past Surgical History:  Procedure Laterality Date  . COLONOSCOPY WITH PROPOFOL N/A 04/24/2016   Procedure: COLONOSCOPY WITH PROPOFOL;  Surgeon: Garlan Fair, MD;  Location: [facility];  Service: Endoscopy;  Laterality: N/A;  . EYE SURGERY Bilateral    lens replacements  . FRACTURE SURGERY Right 2003   fibula  . surgery for fibroids  2000    OB History   No obstetric history on file.      Home Medications    Prior to Admission medications   Medication Sig Start Date End Date Taking? Authorizing Provider  atorvastatin (LIPITOR) 10 MG tablet Take 10 mg by mouth daily.    [provider]  calcium carbonate (OSCAL) 1500 (600 Ca) MG TABS tablet Take by mouth 2 (two) times daily with a meal.    [provider]  ferrous sulfate 325 (65 FE) MG tablet Take 325 mg by mouth daily with breakfast.    [provider]  gentamicin cream (GARAMYCIN) 0.1 % Apply 1 application topically 2 (two) times daily. 08/05/19   Edrick Kins, DPM  Multiple Vitamins-Minerals (EYE VITAMINS PO) Take by mouth.    [provider]  triamcinolone cream (KENALOG) 0.1 % Apply 1 application topically 2 (two) times daily as needed. 05/06/18   Shawnee Knapp, MD  Vitamin D, Cholecalciferol, 25 MCG (1000 UT) CAPS Take by mouth.    [provider]    Family History Family History  Problem  Relation Age of Onset  . Diabetes Brother   . Hypertension Brother   . Alcohol abuse Brother   . Mental illness Brother        nervous breakdown  . Stroke Maternal Grandmother   . Depression Paternal Grandmother   . Arthritis Mother   . COPD Father 44       decsd due to lack of oxygen  . Hypertension Father   . Alcohol abuse Father   . Gout Father   . Alcohol abuse Brother   . Hypertension Brother     Social History Social History   Tobacco Use  . Smoking status: Never Smoker  . Smokeless tobacco: Never Used  Vaping Use  . Vaping Use: Never used  Substance Use Topics  . Alcohol use: No  . Drug use: No     Allergies   Latex   Review of Systems Review of Systems PER HPI   Physical Exam Triage Vital Signs ED  Triage Vitals  Enc Vitals Group     BP 02/19/20 1511 (!) 125/58     Pulse Rate 02/19/20 1511 82     Resp 02/19/20 1511 16     Temp 02/19/20 1511 98.6 F (37 C)     Temp Source 02/19/20 1511 Oral     SpO2 02/19/20 1511 98 %     Weight --      Height --      Head Circumference --      Peak Flow --      Pain Score 02/19/20 1512 6     Pain Loc --      Pain Edu? --      Excl. in [place]? --    No data found.  Updated Vital Signs BP (!) 125/58   Pulse 82   Temp 98.6 F (37 C) (Oral)   Resp 16   SpO2 98%   Visual Acuity Right Eye Distance:   Left Eye Distance:   Bilateral Distance:    Right Eye Near:   Left Eye Near:    Bilateral Near:     Physical Exam Vitals and nursing note reviewed.  Constitutional:      Appearance: Normal appearance. She is not ill-appearing.  HENT:     Head: Atraumatic.     Right Ear: Tympanic membrane normal.     Left Ear: Tympanic membrane normal.     Nose: Nose normal.     Mouth/Throat:     Mouth: Mucous membranes are moist.     Pharynx: Oropharynx is clear.  Eyes:     Extraocular Movements: Extraocular movements intact.     Conjunctiva/sclera: Conjunctivae normal.  Cardiovascular:     Rate and Rhythm: Normal rate and regular rhythm.     Heart sounds: Normal heart sounds.  Pulmonary:     Effort: Pulmonary effort is normal.     Breath sounds: Normal breath sounds.  Abdominal:     General: Bowel sounds are normal. There is no distension.     Palpations: Abdomen is soft.     Tenderness: There is no abdominal tenderness. There is no right CVA tenderness, left CVA tenderness or guarding.  Musculoskeletal:        General: Normal range of motion.     Cervical back: Normal range of motion and neck supple.  Skin:    General: Skin is warm and dry.  Neurological:     Mental Status: She is alert and oriented to person, place, and time.  Psychiatric:  Mood and Affect: Mood normal.        Thought Content: Thought content normal.         Judgment: Judgment normal.    UC Treatments / Results  Labs (all labs ordered are listed, but only abnormal results are displayed) Labs Reviewed  POCT URINALYSIS DIPSTICK, ED / UC - Abnormal; Notable for the following components:      Result Value   Bilirubin Urine SMALL (*)    Ketones, ur 40 (*)    Hgb urine dipstick SMALL (*)    Protein, ur 100 (*)    All other components within normal limits  SARS CORONAVIRUS 2 (TAT 6-24 HRS)    EKG   Radiology No results found.  Procedures Procedures (including critical care time)  Medications Ordered in UC Medications - No data to display  Initial Impression / Assessment and Plan / UC Course  I have reviewed the triage vital signs and the nursing notes.  Pertinent labs & imaging results that were available during my care of the patient were reviewed by me and considered in my medical decision making (see chart for details).     U/A benign, COVID pcr pending. Discussed supportive home care, isolation protocol while awaiting results. F/u if sxs worsening or not improving.   Final Clinical Impressions(s) / UC Diagnoses   Final diagnoses:  Viral URI   Discharge Instructions   None    ED Prescriptions    None     PDMP not reviewed this encounter.   Volney American, [place] 02/19/20 1724

## 2020-02-19 NOTE — ED Triage Notes (Signed)
PT reports flu like symptoms, headache, and loss of taste for 1 week.

## 2020-02-20 LAB — SARS CORONAVIRUS 2 (TAT 6-24 HRS): SARS Coronavirus 2: POSITIVE — AB

## 2020-02-21 ENCOUNTER — Telehealth: Payer: Self-pay | Admitting: Physician Assistant

## 2020-02-21 ENCOUNTER — Ambulatory Visit (HOSPITAL_COMMUNITY)
Admission: RE | Admit: 2020-02-21 | Discharge: 2020-02-21 | Disposition: A | Discharge status: Home / Self Care | Payer: Medicare Other | Source: Ambulatory Visit | Attending: Pulmonary Disease | Admitting: Pulmonary Disease

## 2020-02-21 ENCOUNTER — Other Ambulatory Visit: Payer: Self-pay | Admitting: Physician Assistant

## 2020-02-21 DIAGNOSIS — I739 Peripheral vascular disease, unspecified: Secondary | ICD-10-CM

## 2020-02-21 DIAGNOSIS — U071 COVID-19: Secondary | ICD-10-CM | POA: Diagnosis present

## 2020-02-21 DIAGNOSIS — R7303 Prediabetes: Secondary | ICD-10-CM

## 2020-02-21 DIAGNOSIS — Z23 Encounter for immunization: Secondary | ICD-10-CM | POA: Insufficient documentation

## 2020-02-21 MED ORDER — METHYLPREDNISOLONE SODIUM SUCC 125 MG IJ SOLR: 125.0000 mg | Freq: Once | INTRAMUSCULAR | Status: DC | PRN

## 2020-02-21 MED ORDER — SODIUM CHLORIDE 0.9 % IV SOLN: Freq: Once | INTRAVENOUS | Status: AC

## 2020-02-21 MED ORDER — EPINEPHRINE 0.3 MG/0.3ML IJ SOAJ: 0.3000 mg | Freq: Once | INTRAMUSCULAR | Status: DC | PRN

## 2020-02-21 MED ORDER — ALBUTEROL SULFATE HFA 108 (90 BASE) MCG/ACT IN AERS: 2.0000 | INHALATION_SPRAY | Freq: Once | RESPIRATORY_TRACT | Status: DC | PRN

## 2020-02-21 MED ORDER — FAMOTIDINE IN NACL 20-0.9 MG/50ML-% IV SOLN: 20.0000 mg | Freq: Once | INTRAVENOUS | Status: DC | PRN

## 2020-02-21 MED ORDER — SODIUM CHLORIDE 0.9 % IV SOLN: INTRAVENOUS | Status: DC | PRN

## 2020-02-21 MED ORDER — DIPHENHYDRAMINE HCL 50 MG/ML IJ SOLN: 50.0000 mg | Freq: Once | INTRAMUSCULAR | Status: DC | PRN

## 2020-02-21 NOTE — Discharge Instructions (Signed)

## 2020-02-21 NOTE — Telephone Encounter (Signed)
Called to discuss with Emily [name] about Covid symptoms and the use of casirivimab/imdevimab or bamlanivimab/etesevimab infusion for those with mild to moderate Covid symptoms and at a high risk of hospitalization.    Pt is qualified for this infusion at the monoclonal antibody infusion center due to co-morbid conditions and/or a member of an at-risk group, however would like to think more about the infusion at this time. Symptoms tier reviewed as well as criteria for ending isolation.  Symptoms reviewed that would warrant ED/Hospital evaluation. Preventative practices reviewed. Patient verbalized understanding. Patient advised to call back if he decides that he does want to get infusion. Callback number to the infusion center given. Patient advised to go to Urgent care or ED with severe symptoms. Last date pt would be eligible for infusion is 10/13.    Patient Active Problem List   Diagnosis Date Noted  . Prediabetes 11/21/2012  . Breast fibrocystic disorder 11/21/2012  . Chest pain 11/21/2012  . Anxiety state, unspecified 11/21/2012  . Incidental lung nodule, > 82mm and < 53mm 02/15/2012    Angelena Form PA-C

## 2020-02-21 NOTE — Progress Notes (Signed)
Patient ID: Emily [name], female   DOB: May 14, 1951, 69 y.o.   MRN: [id number]   Diagnosis: COVID-19  Physician: Dr. Joya Gaskins  Procedure: Covid Infusion Clinic Med: bamlanivimab\etesevimab infusion - Provided patient with bamlanimivab\etesevimab fact sheet for patients, parents and caregivers prior to infusion.  Complications: No immediate complications noted.  Discharge: Discharged home   [name] 02/21/2020

## 2020-02-21 NOTE — Telephone Encounter (Signed)
Called to discuss with patient about Covid symptoms and the use of casirivimab/imdevimab, a monoclonal antibody infusion for those with mild to moderate Covid symptoms and at a high risk of hospitalization.  Pt is qualified for this infusion at the [facility] infusion center due to; Specific high risk criteria : Older age (>/= 71 yo)   Message left to call back our hotline [phone] and sent my chart message.   Angelena Form PA-C  [facility]

## 2020-02-21 NOTE — Progress Notes (Signed)
I connected by phone with Emily [name] on 02/21/2020 at 10:26 AM to discuss the potential use of a new treatment for mild to moderate COVID-19 viral infection in non-hospitalized patients.  This patient is a 69 y.o. female that meets the FDA criteria for Emergency Use Authorization of COVID monoclonal antibody casirivimab/imdevimab or bamlanivimab/eteseviamb.  Has a (+) direct SARS-CoV-2 viral test result  Has mild or moderate COVID-19   Is NOT hospitalized due to COVID-19  Is within 10 days of symptom onset  Has at least one of the high risk factor(s) for progression to severe COVID-19 and/or hospitalization as defined in EUA.  Specific high risk criteria : Older age (>/= 71 yo) and Cardiovascular disease or hypertension   I have spoken and communicated the following to the patient or parent/caregiver regarding COVID monoclonal antibody treatment:  1. FDA has authorized the emergency use for the treatment of mild to moderate COVID-19 in adults and pediatric patients with positive results of direct SARS-CoV-2 viral testing who are 69 years of age and older weighing at least 40 kg, and who are at high risk for progressing to severe COVID-19 and/or hospitalization.  2. The significant known and potential risks and benefits of COVID monoclonal antibody, and the extent to which such potential risks and benefits are unknown.  3. Information on available alternative treatments and the risks and benefits of those alternatives, including clinical trials.  4. Patients treated with COVID monoclonal antibody should continue to self-isolate and use infection control measures (e.g., wear mask, isolate, social distance, avoid sharing personal items, clean and disinfect "high touch" surfaces, and frequent handwashing) according to CDC guidelines.   5. The patient or parent/caregiver has the option to accept or refuse COVID monoclonal antibody treatment.  After reviewing this information with the  patient, the patient has agreed to receive one of the available covid 19 monoclonal antibodies and will be provided an appropriate fact sheet prior to infusion.   Sx onset 10/3. Set up for infusion on 10/10 @ 11:30am. Pt is unvaccinated.    Angelena Form, PA-C 02/21/2020 10:26 AM

## 2020-02-25 ENCOUNTER — Telehealth: Payer: Self-pay | Admitting: Family Medicine

## 2020-02-25 NOTE — Telephone Encounter (Signed)
Pt was called per referral after having mab to make appt w/ [facility]. lvm

## 2020-06-06 ENCOUNTER — Other Ambulatory Visit: Payer: Medicare HMO

## 2020-06-06 ENCOUNTER — Other Ambulatory Visit: Payer: Self-pay

## 2020-06-06 DIAGNOSIS — Z20822 Contact with and (suspected) exposure to covid-19: Secondary | ICD-10-CM | POA: Diagnosis not present

## 2020-06-07 LAB — NOVEL CORONAVIRUS, NAA: SARS-CoV-2, NAA: NOT DETECTED

## 2020-06-07 LAB — SARS-COV-2, NAA 2 DAY TAT

## 2020-06-13 ENCOUNTER — Other Ambulatory Visit: Payer: Self-pay

## 2020-06-13 ENCOUNTER — Ambulatory Visit: Payer: Medicare HMO | Attending: Internal Medicine | Admitting: Internal Medicine

## 2020-06-13 ENCOUNTER — Encounter: Payer: Self-pay | Admitting: Internal Medicine

## 2020-06-13 VITALS — BP 113/72 | HR 75 | Temp 98.2°F | Resp 16 | Ht 63.0 in | Wt 145.6 lb

## 2020-06-13 DIAGNOSIS — L309 Dermatitis, unspecified: Secondary | ICD-10-CM

## 2020-06-13 DIAGNOSIS — Z23 Encounter for immunization: Secondary | ICD-10-CM | POA: Diagnosis not present

## 2020-06-13 DIAGNOSIS — E782 Mixed hyperlipidemia: Secondary | ICD-10-CM

## 2020-06-13 DIAGNOSIS — R7303 Prediabetes: Secondary | ICD-10-CM | POA: Diagnosis not present

## 2020-06-13 DIAGNOSIS — Z7689 Persons encountering health services in other specified circumstances: Secondary | ICD-10-CM | POA: Diagnosis not present

## 2020-06-13 DIAGNOSIS — Z2821 Immunization not carried out because of patient refusal: Secondary | ICD-10-CM

## 2020-06-13 MED ORDER — TRIAMCINOLONE ACETONIDE 0.1 % EX CREA: 1.0000 "application " | TOPICAL_CREAM | Freq: Two times a day (BID) | CUTANEOUS | 1 refills | Status: DC

## 2020-06-13 NOTE — Patient Instructions (Addendum)
Please sign a release for [place] to get your medical records from [place] Following a healthy eating pattern may help you to achieve and maintain a healthy body weight, reduce the risk of chronic disease, and live a long and productive life. It is important to follow a healthy eating pattern at an appropriate calorie level for your body. Your nutritional needs should be met primarily through food by choosing a variety of nutrient-rich foods. What are tips for following this plan? Reading food labels  Read labels and choose the following: ? Reduced or low sodium. ? Juices with 100% fruit juice. ? Foods with low saturated fats and high polyunsaturated and monounsaturated fats. ? Foods with whole grains, such as whole wheat, cracked wheat, brown rice, and wild rice. ? Whole grains that are fortified with folic acid. This is recommended for women who are pregnant or who want to become pregnant.  Read labels and avoid the following: ? Foods with a lot of added sugars. These include foods that contain brown sugar, corn sweetener, corn syrup, dextrose, fructose, glucose, high-fructose corn syrup, honey, invert sugar, lactose, malt syrup, maltose, molasses, raw sugar, sucrose, trehalose, or turbinado sugar.  Do not eat more than the following amounts of added sugar per day:  6 teaspoons (25 g) for women.  9 teaspoons (38 g) for men. ? Foods that contain processed or refined starches and grains. ? Refined grain products, such as white flour, degermed cornmeal, white bread, and white rice. Shopping  Choose nutrient-rich snacks, such as vegetables, whole fruits, and nuts. Avoid high-calorie and high-sugar snacks, such as potato chips, fruit snacks, and candy.  Use oil-based dressings and spreads on foods instead of solid fats such as butter, stick margarine, or cream cheese.  Limit pre-made sauces, mixes, and "instant" products such as flavored rice, instant noodles, and  ready-made pasta.  Try more plant-protein sources, such as tofu, tempeh, black beans, edamame, lentils, nuts, and seeds.  Explore eating plans such as the Mediterranean diet or vegetarian diet. Cooking  Use oil to saut or stir-fry foods instead of solid fats such as butter, stick margarine, or lard.  Try baking, boiling, grilling, or broiling instead of frying.  Remove the fatty part of meats before cooking.  Steam vegetables in water or broth. Meal planning  At meals, imagine dividing your plate into fourths: ? One-half of your plate is fruits and vegetables. ? One-fourth of your plate is whole grains. ? One-fourth of your plate is protein, especially lean meats, poultry, eggs, tofu, beans, or nuts.  Include low-fat dairy as part of your daily diet.   Lifestyle  Choose healthy options in all settings, including home, work, school, restaurants, or stores.  Prepare your food safely: ? Wash your hands after handling raw meats. ? Keep food preparation surfaces clean by regularly washing with hot, soapy water. ? Keep raw meats separate from ready-to-eat foods, such as fruits and vegetables. ? Cook seafood, meat, poultry, and eggs to the recommended internal temperature. ? Store foods at safe temperatures. In general:  Keep cold foods at 58F (4.4C) or below.  Keep hot foods at 158F (60C) or above.  Keep your freezer at [facility] (-17.8C) or below.  Foods are no longer safe to eat when they have been between the temperatures of 40-158F (4.4-60C) for more than 2 hours. What foods should I eat? Fruits Aim to eat 2 cup-equivalents of fresh, canned (in natural juice), or frozen fruits each day. Examples of  1 cup-equivalent of fruit include 1 small apple, 8 large strawberries, 1 cup canned fruit,  cup dried fruit, or 1 cup 100% juice. Vegetables Aim to eat 2-3 cup-equivalents of fresh and frozen vegetables each day, including different varieties and colors. Examples of 1  cup-equivalent of vegetables include 2 medium carrots, 2 cups raw, leafy greens, 1 cup chopped vegetable (raw or cooked), or 1 medium baked potato. Grains Aim to eat 6 ounce-equivalents of whole grains each day. Examples of 1 ounce-equivalent of grains include 1 slice of bread, 1 cup ready-to-eat cereal, 3 cups popcorn, or  cup cooked rice, pasta, or cereal. Meats and other proteins Aim to eat 5-6 ounce-equivalents of protein each day. Examples of 1 ounce-equivalent of protein include 1 egg, 1/2 cup nuts or seeds, or 1 tablespoon (16 g) peanut butter. A cut of meat or fish that is the size of a deck of cards is about 3-4 ounce-equivalents.  Of the protein you eat each week, try to have at least 8 ounces come from seafood. This includes salmon, trout, herring, and anchovies. Dairy Aim to eat 3 cup-equivalents of fat-free or low-fat dairy each day. Examples of 1 cup-equivalent of dairy include 1 cup (240 mL) milk, 8 ounces (250 g) yogurt, 1 ounces (44 g) natural cheese, or 1 cup (240 mL) fortified soy milk. Fats and oils  Aim for about 5 teaspoons (21 g) per day. Choose monounsaturated fats, such as canola and olive oils, avocados, peanut butter, and most nuts, or polyunsaturated fats, such as sunflower, corn, and soybean oils, walnuts, pine nuts, sesame seeds, sunflower seeds, and flaxseed. Beverages  Aim for six 8-oz glasses of water per day. Limit coffee to three to five 8-oz cups per day.  Limit caffeinated beverages that have added calories, such as soda and energy drinks.  Limit alcohol intake to no more than 1 drink a day for nonpregnant women and 2 drinks a day for men. One drink equals 12 oz of beer (355 mL), 5 oz of wine (148 mL), or 1 oz of hard liquor (44 mL). Seasoning and other foods  Avoid adding excess amounts of salt to your foods. Try flavoring foods with herbs and spices instead of salt.  Avoid adding sugar to foods.  Try using oil-based dressings, sauces, and spreads  instead of solid fats. This information is based on general [place] nutrition guidelines. For more information, visit BuildDNA.es. Exact amounts may vary based on your nutrition needs. Summary  A healthy eating plan may help you to maintain a healthy weight, reduce the risk of chronic diseases, and stay active throughout your life.  Plan your meals. Make sure you eat the right portions of a variety of nutrient-rich foods.  Try baking, boiling, grilling, or broiling instead of frying.  Choose healthy options in all settings, including home, work, school, restaurants, or stores. This information is not intended to replace advice given to you by your health care provider. Make sure you discuss any questions you have with your health care provider. Document Revised: 08/12/2017 Document Reviewed: 08/12/2017 Elsevier Patient Education  2021 [employer].   Td (Tetanus, Diphtheria) Vaccine: What You Need to Know 1. Why get vaccinated? Td vaccine can prevent tetanus and diphtheria. Tetanus enters the body through cuts or wounds. Diphtheria spreads from person to person.  TETANUS (T) causes painful stiffening of the muscles. Tetanus can lead to serious health problems, including being unable to open the mouth, having trouble swallowing and breathing, or death.  DIPHTHERIA (D)  can lead to difficulty breathing, heart failure, paralysis, or death. 2. Td vaccine Td is only for children 7 years and older, adolescents, and adults.  Td is usually given as a booster dose every 10 years, or after 5 years in the case of a severe or dirty wound or burn. Another vaccine, called "Tdap," may be used instead of Td. Tdap protects against pertussis, also known as "whooping cough," in addition to tetanus and diphtheria. Td may be given at the same time as other vaccines. 3. Talk with your health care provider Tell your vaccination provider if the person getting the vaccine:  Has had an allergic reaction  after a previous dose of any vaccine that protects against tetanus or diphtheria, or has any severe, life-threatening allergies  Has ever had Guillain-Barr Syndrome (also called "GBS")  Has had severe pain or swelling after a previous dose of any vaccine that protects against tetanus or diphtheria In some cases, your health care provider may decide to postpone Td vaccination until a future visit. People with minor illnesses, such as a cold, may be vaccinated. People who are moderately or severely ill should usually wait until they recover before getting Td vaccine.  Your health care provider can give you more information. 4. Risks of a vaccine reaction  Pain, redness, or swelling where the shot was given, mild fever, headache, feeling tired, and nausea, vomiting, diarrhea, or stomachache sometimes happen after Td vaccination. People sometimes faint after medical procedures, including vaccination. Tell your provider if you feel dizzy or have vision changes or ringing in the ears.  As with any medicine, there is a very remote chance of a vaccine causing a severe allergic reaction, other serious injury, or death. 5. What if there is a serious problem? An allergic reaction could occur after the vaccinated person leaves the clinic. If you see signs of a severe allergic reaction (hives, swelling of the face and throat, difficulty breathing, a fast heartbeat, dizziness, or weakness), call 9-1-1 and get the person to the nearest hospital.  For other signs that concern you, call your health care provider.  Adverse reactions should be reported to the Vaccine Adverse Event Reporting System (VAERS). Your health care provider will usually file this report, or you can do it yourself. Visit the VAERS website at www.vaers.SamedayNews.es or call (225)794-9759. VAERS is only for reporting reactions, and VAERS staff members do not give medical advice. 6. The National Vaccine Injury Compensation Program The [employer]  Vaccine Injury Compensation Program (VICP) is a federal program that was created to compensate people who may have been injured by certain vaccines. Claims regarding alleged injury or death due to vaccination have a time limit for filing, which may be as short as two years. Visit the VICP website at GoldCloset.com.ee or call (831)343-6813 to learn about the program and about filing a claim. 7. How can I learn more?  Ask your health care provider.  Call your local or state health department.  Visit the website of the Food and Drug Administration (FDA) for vaccine package inserts and additional information at TraderRating.uy.  Contact the Centers for Disease Control and Prevention (CDC): ? Call 208-401-9189 (1-800-CDC-INFO) or ? Visit CDC's website at http://hunter.com/. Vaccine Information Statement Td (Tetanus, Diphtheria) Vaccine (12/18/2019) This information is not intended to replace advice given to you by your health care provider. Make sure you discuss any questions you have with your health care provider. Document Revised: 02/04/2020 Document Reviewed: 02/04/2020 Elsevier Patient Education  2021 [employer].

## 2020-06-13 NOTE — Progress Notes (Signed)
Patient ID: Emily [name], female    DOB: December 20, 1950  MRN: [id number]  CC: New Patient (Initial Visit)   Subjective: Emily [name] is a 70 y.o. female who presents for new pt visit Her concerns today include:  Patient with history of prediabetes, anxiety, fibrocystic breast, HL, iron deficiency, COVID-19 infection 02/2020  Previous PCP with NP Novella Rob at [facility]; prior to that she was at [facility].  She was not satified with the care there.  Last seen 02/2020.   She did not bring meds with her but tells me she is on cholesterol lowering med 10 mg.  I see atorvastatin on her chart.  Also takes iron "from time to time."  Also takes Vit D 1000 IU, Zinc, Vit C and MV. Endorses hx of pre-DM and anxiety. Does "so so with eating habits." Helps to care for her 2 grand-kids.  She cooks and cleans for herself and for her son and the 2 grandchildren.  This keeps her busy where she usually is only able to get in about 2 meals a day.  Does not get in much fruits and veggies.  She reports being fairly active.  She is semiretired.  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  Does a lot of yard work. Lives alone.  She has 2 grown children 1 of which is a Psychiatric nurse in [place]. She is independent in ADLs including driving.  She ambulates unassisted.  C/o intermittent itchy rash over shoulders, upper posterior thorax and arms for yrs.  Never saw derm.  Skin very sensitive to lotions.  Currently using Cetaphil cream and Vaseline which helps.  She tells me that her doctor used to prescribe a compounded cream that included triamcinolone.  HM: Has not had COVID vaccine. Had MAB infusion in Oct 2021.  Declines flu shot.  Agrees for Tdap  Past medical, social, family history and surgical history reviewed. Patient Active Problem List   Diagnosis Date Noted  . Influenza vaccine refused 06/13/2020  . Prediabetes 11/21/2012  . Breast fibrocystic disorder 11/21/2012   . Chest pain 11/21/2012  . Anxiety state, unspecified 11/21/2012  . Incidental lung nodule, > 76mm and < 70mm 02/15/2012     Current Outpatient Medications on File Prior to Visit  Medication Sig Dispense Refill  . atorvastatin (LIPITOR) 10 MG tablet Take 10 mg by mouth daily.    . ferrous sulfate 325 (65 FE) MG tablet Take 325 mg by mouth daily with breakfast.    . Multiple Vitamins-Minerals (EYE VITAMINS PO) Take by mouth.    . Vitamin D, Cholecalciferol, 25 MCG (1000 UT) CAPS Take by mouth.     No current facility-administered medications on file prior to visit.    Allergies  Allergen Reactions  . Latex Itching and Rash    Social History   Socioeconomic History  . Marital status: Divorced    Spouse name: Not on file  . Number of children: 2  . Years of education: Not on file  . Highest education level: Some college, no degree  Occupational History  . Occupation: Day-care  Tobacco Use  . Smoking status: Never Smoker  . Smokeless tobacco: Never Used  Vaping Use  . Vaping Use: Never used  Substance and Sexual Activity  . Alcohol use: No  . Drug use: No  . Sexual activity: Never  Other Topics Concern  . Not on file  Social History Narrative  . Not on file  Social Determinants of Health   Financial Resource Strain: Not on file  Food Insecurity: Not on file  Transportation Needs: Not on file  Physical Activity: Not on file  Stress: Not on file  Social Connections: Not on file  Intimate Partner Violence: Not on file    Family History  Problem Relation Age of Onset  . Diabetes Brother   . Hypertension Brother   . Alcohol abuse Brother   . Mental illness Brother        nervous breakdown  . Stroke Maternal Grandmother   . Depression Paternal Grandmother   . Arthritis Mother   . COPD Father 26       decsd due to lack of oxygen  . Hypertension Father   . Alcohol abuse Father   . Gout Father   . Alcohol abuse Brother   . Hypertension Brother     Past  Surgical History:  Procedure Laterality Date  . COLONOSCOPY WITH PROPOFOL N/A 04/24/2016   Procedure: COLONOSCOPY WITH PROPOFOL;  Surgeon: Emily Fair, MD;  Location: [facility];  Service: Endoscopy;  Laterality: N/A;  . EYE SURGERY Bilateral    lens replacements  . FRACTURE SURGERY Right 2003   fibula  . surgery for fibroids  2000    ROS: Review of Systems Negative except as stated above  PHYSICAL EXAM: BP 113/72   Pulse 75   Temp 98.2 F (36.8 C)   Resp 16   Ht 5\' 3"  (1.6 m)   Wt 145 lb 9.6 oz (66 kg)   SpO2 97%   BMI 25.79 kg/m   Wt Readings from Last 3 Encounters:  06/13/20 145 lb 9.6 oz (66 kg)  09/08/19 149 lb (67.6 kg)  05/06/18 131 lb 9.6 oz (59.7 kg)    Physical Exam  General appearance - alert, well appearing, elderly female and in no distress.  She appears mildly unkept and has on several layers of clothing with mismatched socks Mental status - alert, oriented to person, place, and time Eyes - pupils equal and reactive, extraocular eye movements intact Nose - normal and patent, no erythema, discharge or polyps Mouth - mucous membranes moist, pharynx normal without lesions Neck - supple, no significant adenopathy Chest - clear to auscultation, no wheezes, rales or rhonchi, symmetric air entry Heart - normal rate, regular rhythm, normal S1, S2, no murmurs, rubs, clicks or gallops Extremities -no lower extremity edema.  Good peripheral pulses in the upper extremities. Skin: Skin over the posterior thorax and upper arms is very dry with mild to moderate excoriation marks Depression screen [facility] 2/9 06/13/2020 05/06/2018 10/02/2017  Decreased Interest 0 0 0  Down, Depressed, Hopeless 0 0 0  PHQ - 2 Score 0 0 0   GAD 7 : Generalized Anxiety Score 06/13/2020  Nervous, Anxious, on Edge 2  Control/stop worrying 0  Worry too much - different things 2  Trouble relaxing 0  Restless 0  Easily annoyed or irritable 0  Afraid - awful might happen 0  Total GAD 7  Score 4      CMP Latest Ref Rng & Units 05/06/2018 07/20/2017 09/24/2016  Glucose 65 - 99 mg/dL 94 89 97  BUN 8 - 27 mg/dL 19 18 21   Creatinine 0.57 - 1.00 mg/dL 0.59 0.70 0.69  Sodium 134 - 144 mmol/L 138 140 139  Potassium 3.5 - 5.2 mmol/L 3.8 3.9 4.0  Chloride 96 - 106 mmol/L 102 101 103  CO2 20 - 29 mmol/L 24 26 26  Calcium 8.7 - 10.3 mg/dL 9.3 9.2 8.8  Total Protein 6.0 - 8.5 g/dL 6.6 7.0 6.8  Total Bilirubin 0.0 - 1.2 mg/dL 0.4 <0.2 0.4  Alkaline Phos 39 - 117 IU/L 56 57 58  AST 0 - 40 IU/L 15 23 25   ALT 0 - 32 IU/L 18 19 26    Lipid Panel     Component Value Date/Time   CHOL 199 02/23/2016 0918   TRIG 41 02/23/2016 0918   HDL 97 02/23/2016 0918   CHOLHDL 2.1 02/23/2016 0918   VLDL 8 02/23/2016 0918   LDLCALC 94 02/23/2016 0918    CBC    Component Value Date/Time   WBC 3.8 05/06/2018 1102   WBC 4.4 (A) 07/20/2017 1455   WBC 4.4 08/18/2015 1050   RBC 4.40 05/06/2018 1102   RBC 4.43 07/20/2017 1455   RBC 4.29 08/18/2015 1050   HGB 11.7 05/06/2018 1102   HCT 37.3 05/06/2018 1102   PLT 257 05/06/2018 1102   MCV 85 05/06/2018 1102   MCH 26.6 05/06/2018 1102   MCH 26.5 (A) 07/20/2017 1455   MCH 27.3 08/18/2015 1050   MCHC 31.4 (L) 05/06/2018 1102   MCHC 32.0 07/20/2017 1455   MCHC 31.8 (L) 08/18/2015 1050   RDW 12.8 05/06/2018 1102   LYMPHSABS 1,496 08/18/2015 1050   MONOABS 484 08/18/2015 1050   EOSABS 88 08/18/2015 1050   BASOSABS 0 08/18/2015 1050    ASSESSMENT AND PLAN: 1. Encounter to establish care  2. Dermatitis Encourage her to keep the skin moisturized as much as possible.  Recommend putting on the Cetaphil cream after she dries off from each bath. - triamcinolone (KENALOG) 0.1 %; Apply 1 application topically 2 (two) times daily.  Dispense: 45 g; Refill: 1  3. Influenza vaccine refused Recommended.  Patient declined  4. Mixed hyperlipidemia She will continue atorvastatin.  She has enough at home  5. Prediabetes Healthy eating habits  discussed and encouraged.  Printed information given. - CBC - Comprehensive metabolic panel - Lipid panel - Hemoglobin A1c  6. Need for COVID-19 vaccine She is 3 months out since having Covid and having received monoclonal antibody.  Encouraged her to be vaccinated.  She declines me submitting her name to our vaccine clinic.  She states that she will get it at [facility]  7. Need for Tdap vaccination Given today.  Patient to sign a release for me to get her records from [facility].    Patient was given the opportunity to ask questions.  Patient verbalized understanding of the plan and was able to repeat key elements of the plan.   Orders Placed This Encounter  Procedures  . Tdap vaccine greater than or equal to 7yo IM  . CBC  . Comprehensive metabolic panel  . Lipid panel  . Hemoglobin A1c     Requested Prescriptions   Signed Prescriptions Disp Refills  . triamcinolone (KENALOG) 0.1 % 45 g 1    Sig: Apply 1 application topically 2 (two) times daily.    Return in about 3 months (around 09/10/2020) for 3 mth for Medicare Wellness Visit. Please sign a release for records from [place], MD, Rosalita Chessman

## 2020-06-14 LAB — LIPID PANEL
Chol/HDL Ratio: 2 ratio (ref 0.0–4.4)
Cholesterol, Total: 198 mg/dL (ref 100–199)
HDL: 99 mg/dL (ref >39)
LDL Chol Calc (NIH): 90 mg/dL (ref 0–99)
Triglycerides: 45 mg/dL (ref 0–149)
VLDL Cholesterol Cal: 9 mg/dL (ref 5–40)

## 2020-06-14 LAB — COMPREHENSIVE METABOLIC PANEL
ALT: 18 IU/L (ref 0–32)
AST: 22 IU/L (ref 0–40)
Albumin/Globulin Ratio: 1.5 (ref 1.2–2.2)
Albumin: 4.5 g/dL (ref 3.7–4.7)
Alkaline Phosphatase: 72 IU/L (ref 44–121)
BUN/Creatinine Ratio: 30 — ABNORMAL HIGH (ref 12–28)
BUN: 23 mg/dL (ref 8–27)
Bilirubin Total: 0.2 mg/dL (ref 0.0–1.2)
CO2: 24 mmol/L (ref 20–29)
Calcium: 9.3 mg/dL (ref 8.7–10.3)
Chloride: 103 mmol/L (ref 96–106)
Creatinine, Ser: 0.76 mg/dL (ref 0.57–1.00)
GFR calc Af Amer: 91 mL/min/{1.73_m2} (ref >59)
GFR calc non Af Amer: 79 mL/min/{1.73_m2} (ref >59)
Globulin, Total: 3.1 g/dL (ref 1.5–4.5)
Glucose: 99 mg/dL (ref 65–99)
Potassium: 4.2 mmol/L (ref 3.5–5.2)
Sodium: 144 mmol/L (ref 134–144)
Total Protein: 7.6 g/dL (ref 6.0–8.5)

## 2020-06-14 LAB — HEMOGLOBIN A1C
Est. average glucose Bld gHb Est-mCnc: 126 mg/dL
Hgb A1c MFr Bld: 6 % — ABNORMAL HIGH (ref 4.8–5.6)

## 2020-06-14 LAB — CBC
Hematocrit: 39.1 % (ref 34.0–46.6)
Hemoglobin: 12.5 g/dL (ref 11.1–15.9)
MCH: 27 pg (ref 26.6–33.0)
MCHC: 32 g/dL (ref 31.5–35.7)
MCV: 84 fL (ref 79–97)
Platelets: 269 10*3/uL (ref 150–450)
RBC: 4.63 x10E6/uL (ref 3.77–5.28)
RDW: 13.1 % (ref 11.7–15.4)
WBC: 4.7 10*3/uL (ref 3.4–10.8)

## 2020-06-14 NOTE — Progress Notes (Signed)
Let patient know that her blood cell counts including red blood cell, white blood cells and platelet counts are normal.  Kidney and liver function tests are normal.  Cholesterol level is good.  She is still in the range for prediabetes.  Healthy eating habits and regular exercise as discussed will help prevent progression to diabetes.

## 2020-06-15 ENCOUNTER — Telehealth: Payer: Self-pay

## 2020-06-15 NOTE — Telephone Encounter (Signed)
Contacted pt to go over lab results pt is aware and doesn't have any questions or concerns 

## 2020-07-08 ENCOUNTER — Telehealth: Payer: Self-pay | Admitting: Internal Medicine

## 2020-07-08 DIAGNOSIS — M25571 Pain in right ankle and joints of right foot: Secondary | ICD-10-CM

## 2020-07-08 NOTE — Telephone Encounter (Signed)
Will forward to provider to submit referral

## 2020-07-08 NOTE — Telephone Encounter (Signed)
Copied from [place] [phone]. Topic: Referral - Request for Referral >> Jul 07, 2020  3:48 PM Yvette Rack wrote: Has patient seen PCP for this complaint? yes  *If NO, is insurance requiring patient see PCP for this issue before PCP can refer them? Referral for which specialty: Podiatrist  Preferred provider/office: [place] / [facility] Reason for referral: swelling and stinging of the ankle

## 2020-07-21 ENCOUNTER — Other Ambulatory Visit: Payer: Self-pay

## 2020-07-21 ENCOUNTER — Encounter: Payer: Self-pay | Admitting: Podiatry

## 2020-07-21 ENCOUNTER — Ambulatory Visit: Payer: Medicare HMO | Admitting: Podiatry

## 2020-07-21 DIAGNOSIS — B351 Tinea unguium: Secondary | ICD-10-CM

## 2020-07-21 DIAGNOSIS — R600 Localized edema: Secondary | ICD-10-CM | POA: Diagnosis not present

## 2020-07-21 DIAGNOSIS — M779 Enthesopathy, unspecified: Secondary | ICD-10-CM | POA: Diagnosis not present

## 2020-07-21 NOTE — Progress Notes (Signed)
Subjective:   Patient ID: Emily [name], female   DOB: 70 y.o.   MRN: [id number]   HPI She presents with several concerns with 1 being ankle pain left and also flatfoot deformity and nail disease that she is concerned about.  Also concerned about her circulation   ROS      Objective:  Physical Exam  Neurovascular status was found to be intact with good pulses and I do not think this is part of her problem with some swelling in the outside of the left ankle around the peroneal complex with collapsed arch as part of the pathology along with chronic low-grade edema with negative Homans' sign.  Nails are just thickened but no other signs of pathology     Assessment:  Probability for foot structure leading to increased stress on the lateral ankle tendon group with possibility for sinus tarsitis with nail disease and lesions     Plan:  H&P reviewed all conditions.  I do not recommend vascular analysis as pulses were strong and I did explain the foot structure and considerations long-term for injection treatment for possible bracing.  Patient had multiple questions which were answered today.  I did dispense ankle compression stocking to try to take pressure off her foot and ankle

## 2020-07-22 ENCOUNTER — Other Ambulatory Visit: Payer: Self-pay | Admitting: Internal Medicine

## 2020-07-22 DIAGNOSIS — Z1231 Encounter for screening mammogram for malignant neoplasm of breast: Secondary | ICD-10-CM

## 2020-08-17 ENCOUNTER — Encounter: Payer: Self-pay | Admitting: Nurse Practitioner

## 2020-08-17 ENCOUNTER — Other Ambulatory Visit: Payer: Self-pay

## 2020-08-17 ENCOUNTER — Ambulatory Visit: Payer: Medicare HMO | Attending: Nurse Practitioner | Admitting: Nurse Practitioner

## 2020-08-17 DIAGNOSIS — M26609 Unspecified temporomandibular joint disorder, unspecified side: Secondary | ICD-10-CM | POA: Diagnosis not present

## 2020-08-17 NOTE — Progress Notes (Signed)
Virtual Visit via Telephone Note Due to national recommendations of social distancing due to [place] 19, telehealth visit is felt to be most appropriate for this patient at this time.  I discussed the limitations, risks, security and privacy concerns of performing an evaluation and management service by telephone and the availability of in person appointments. I also discussed with the patient that there may be a patient responsible charge related to this service. The patient expressed understanding and agreed to proceed.    I connected with Karenann Cai on 08/17/20  at   3:30 PM EDT  EDT by telephone and verified that I am speaking with the correct person using two identifiers.   Consent I discussed the limitations, risks, security and privacy concerns of performing an evaluation and management service by telephone and the availability of in person appointments. I also discussed with the patient that there may be a patient responsible charge related to this service. The patient expressed understanding and agreed to proceed.   Location of Patient: Private Residence   Location of Provider: [place] and [place] participating in Telemedicine visit: Geryl Rankins FNP-BC [place]    History of Present Illness: Telemedicine visit for: Jaw Pain  She has been having right sided jaw pain for the past 4 days. She does have a history of TMJ. She went to her dentist appointment yesterday at 8am and was administered "a shot" and dental cleaning. Patient states she was told she needs to have the cap removed from one of her teeth and have the tooth cleaned out and cap replaced. Since she received treatment she is no longer experiencing jaw pain today. She also states she was instructed to wear her mouth guard at night which she has not been doing. She denies frontal or maxillary headache, periorbital congestion, fever, purulent nasal discharge, ear pain or  pressure.   Past Medical History:  Diagnosis Date  . Anemia    iron runs low  . Anxiety   . Anxiety state, unspecified 11/21/2012  . Breast fibrocystic disorder 11/21/2012  . Bronchitis   . Cataract   . Chest pain 11/21/2012   Normal stress echocardiogram 11/2010   . History of chicken pox   . History of uterine fibroid   . Incidental lung nodule, > 29mm and < 79mm 02/15/2012   55mm RUL subpleural nodule found on CXR screening for TB due to h/o TB exposure as a child with positive ppd.  Recheck CT scan in 6-12 mos (March - Sept 2014) Repeat CT scan 01/2013 shows: Stable subpleural nodule in the right upper lobe measuring 5 mm. If this patient is at low risk for lung cancer then no further workup is necessary.  If this patient is at increased risk for lung cancer then foll  . Nontraumatic tear of skin    right side of vagina skin since 06-12-15, small amount of blood when wipes  . Prediabetes 11/21/2012  . Tuberculosis    exposed as a young child    Past Surgical History:  Procedure Laterality Date  . COLONOSCOPY WITH PROPOFOL N/A 04/24/2016   Procedure: COLONOSCOPY WITH PROPOFOL;  Surgeon: Garlan Fair, MD;  Location: [facility];  Service: Endoscopy;  Laterality: N/A;  . EYE SURGERY Bilateral    lens replacements  . FRACTURE SURGERY Right 2003   fibula  . surgery for fibroids  2000    Family History  Problem Relation Age of Onset  . Diabetes  Brother   . Hypertension Brother   . Alcohol abuse Brother   . Mental illness Brother        nervous breakdown  . Stroke Maternal Grandmother   . Depression Paternal Grandmother   . Arthritis Mother   . COPD Father 40       decsd due to lack of oxygen  . Hypertension Father   . Alcohol abuse Father   . Gout Father   . Alcohol abuse Brother   . Hypertension Brother     Social History   Socioeconomic History  . Marital status: Divorced    Spouse name: Not on file  . Number of children: 2  . Years of education: Not on file  .  Highest education level: Some college, no degree  Occupational History  . Occupation: Day-care  Tobacco Use  . Smoking status: Never Smoker  . Smokeless tobacco: Never Used  Vaping Use  . Vaping Use: Never used  Substance and Sexual Activity  . Alcohol use: No  . Drug use: No  . Sexual activity: Never  Other Topics Concern  . Not on file  Social History Narrative  . Not on file   Social Determinants of Health   Financial Resource Strain: Not on file  Food Insecurity: Not on file  Transportation Needs: Not on file  Physical Activity: Not on file  Stress: Not on file  Social Connections: Not on file     Observations/Objective: Awake, alert and oriented x 3   Review of Systems  Constitutional: Negative for fever, malaise/fatigue and weight loss.  HENT: Negative.  Negative for congestion, ear discharge, ear pain, hearing loss, nosebleeds, sinus pain, sore throat and tinnitus.   Eyes: Negative.  Negative for blurred vision, double vision and photophobia.  Respiratory: Negative.  Negative for cough, shortness of breath and stridor.   Cardiovascular: Negative.  Negative for chest pain, palpitations and leg swelling.  Gastrointestinal: Negative.  Negative for heartburn, nausea and vomiting.  Musculoskeletal: Negative.  Negative for myalgias.  Neurological: Negative.  Negative for dizziness, focal weakness, seizures and headaches.  Psychiatric/Behavioral: Negative.  Negative for suicidal ideas.    Assessment and Plan: Meryn was seen today for facial pain.  Diagnoses and all orders for this visit:  TMJ dysfunction Follow up with Dentist. She is awaiting xray results from dentist   Follow Up Instructions Return if symptoms worsen or fail to improve.     I discussed the assessment and treatment plan with the patient. The patient was provided an opportunity to ask questions and all were answered. The patient agreed with the plan and demonstrated an understanding of the  instructions.   The patient was advised to call back or seek an in-person evaluation if the symptoms worsen or if the condition fails to improve as anticipated.  I provided 8 minutes of non-face-to-face time during this encounter including median intraservice time, reviewing previous notes, labs, imaging, medications and explaining diagnosis and management.  Gildardo Pounds, FNP-BC

## 2020-09-13 ENCOUNTER — Ambulatory Visit: Payer: Medicare HMO | Attending: Internal Medicine | Admitting: Internal Medicine

## 2020-09-13 ENCOUNTER — Encounter: Payer: Self-pay | Admitting: Internal Medicine

## 2020-09-13 ENCOUNTER — Other Ambulatory Visit: Payer: Self-pay

## 2020-09-13 VITALS — BP 114/71 | HR 85 | Resp 16 | Ht 61.5 in | Wt 147.2 lb

## 2020-09-13 DIAGNOSIS — Z0000 Encounter for general adult medical examination without abnormal findings: Secondary | ICD-10-CM | POA: Diagnosis not present

## 2020-09-13 DIAGNOSIS — Z2821 Immunization not carried out because of patient refusal: Secondary | ICD-10-CM

## 2020-09-13 DIAGNOSIS — Z780 Asymptomatic menopausal state: Secondary | ICD-10-CM | POA: Diagnosis not present

## 2020-09-13 DIAGNOSIS — Z7189 Other specified counseling: Secondary | ICD-10-CM | POA: Diagnosis not present

## 2020-09-13 DIAGNOSIS — H5203 Hypermetropia, bilateral: Secondary | ICD-10-CM

## 2020-09-13 NOTE — Patient Instructions (Signed)
Please read and discussed the advanced directive handout that I gave you today with your son or closest next of kin.  Should you decide to execute an advanced directive, please bring a copy of this time document for our records.  I have referred you to your eye doctor for general eye exam.  You will receive a call to be scheduled for the bone density study to screen for osteoporosis.  We discussed your health goal of starting your exercise by walking in the park 3 days a week for 20 minutes.  You should start as soon as possible.  Use a stress ball to help strengthen your hands.

## 2020-09-13 NOTE — Progress Notes (Signed)
Subjective:   Emily [name] is a 70 y.o. female who presents for Medicare Annual (Subsequent) preventive examination. Patient with history of prediabetes, anxiety, fibrocystic breast, HL, iron deficiency, COVID-19 infection 02/2020, TMJ  Review of Systems    -MSK: Patient reports some weakness in both hands at times especially when she has to do a lot of work.  She will be going to help take care of her 68 year old mother and wonders whether she would need some physical therapy on her hands before going.  Gives history of arthritis in the hands.  No swelling or stiffness at this time.       Objective:    Today's Vitals   09/13/20 1025  BP: 114/71  Pulse: 85  Resp: 16  SpO2: 96%  Weight: 147 lb 3.2 oz (66.8 kg)  Height: 5' 1.5" (1.562 m)   Body mass index is 27.36 kg/m.  Advanced Directives 09/13/2020 04/24/2016 04/24/2016 04/20/2016 06/13/2015 01/06/2014 09/25/2013  Does Patient Have a Medical Advance Directive? No No No No No No Patient does not have advance directive;Patient would like information  Would patient like information on creating a medical advance directive? Yes (Inpatient - patient defers creating a medical advance directive at this time - Information given) - - - No - patient declined information No - patient declined information Advance directive packet given    Current Medications (verified) Outpatient Encounter Medications as of 09/13/2020  Medication Sig  . atorvastatin (LIPITOR) 10 MG tablet Take 10 mg by mouth daily.  . ferrous sulfate 325 (65 FE) MG tablet Take 325 mg by mouth daily with breakfast.  . Multiple Vitamins-Minerals (EYE VITAMINS PO) Take by mouth.  . triamcinolone (KENALOG) 0.1 % Apply 1 application topically 2 (two) times daily. (Patient not taking: Reported on 08/17/2020)  . Vitamin D, Cholecalciferol, 25 MCG (1000 UT) CAPS Take by mouth.   No facility-administered encounter medications on file as of 09/13/2020.    Allergies (verified) Latex    History: Past Medical History:  Diagnosis Date  . Anemia    iron runs low  . Anxiety   . Anxiety state, unspecified 11/21/2012  . Breast fibrocystic disorder 11/21/2012  . Bronchitis   . Cataract   . Chest pain 11/21/2012   Normal stress echocardiogram 11/2010   . History of chicken pox   . History of uterine fibroid   . Incidental lung nodule, > 18mm and < 56mm 02/15/2012   55mm RUL subpleural nodule found on CXR screening for TB due to h/o TB exposure as a child with positive ppd.  Recheck CT scan in 6-12 mos (March - Sept 2014) Repeat CT scan 01/2013 shows: Stable subpleural nodule in the right upper lobe measuring 5 mm. If this patient is at low risk for lung cancer then no further workup is necessary.  If this patient is at increased risk for lung cancer then foll  . Nontraumatic tear of skin    right side of vagina skin since 06-12-15, small amount of blood when wipes  . Prediabetes 11/21/2012  . Tuberculosis    exposed as a young child   Past Surgical History:  Procedure Laterality Date  . COLONOSCOPY WITH PROPOFOL N/A 04/24/2016   Procedure: COLONOSCOPY WITH PROPOFOL;  Surgeon: Garlan Fair, MD;  Location: [facility];  Service: Endoscopy;  Laterality: N/A;  . EYE SURGERY Bilateral    lens replacements  . FRACTURE SURGERY Right 2003   fibula  . surgery for fibroids  2000   Family  History  Problem Relation Age of Onset  . Diabetes Brother   . Hypertension Brother   . Alcohol abuse Brother   . Mental illness Brother        nervous breakdown  . Stroke Maternal Grandmother   . Depression Paternal Grandmother   . Arthritis Mother   . COPD Father 2       decsd due to lack of oxygen  . Hypertension Father   . Alcohol abuse Father   . Gout Father   . Alcohol abuse Brother   . Hypertension Brother    Social History   Socioeconomic History  . Marital status: Divorced    Spouse name: Not on file  . Number of children: 2  . Years of education: Not on file  .  Highest education level: Some college, no degree  Occupational History  . Occupation: Day-care  Tobacco Use  . Smoking status: Never Smoker  . Smokeless tobacco: Never Used  Vaping Use  . Vaping Use: Never used  Substance and Sexual Activity  . Alcohol use: No  . Drug use: No  . Sexual activity: Never  Other Topics Concern  . Not on file  Social History Narrative  . Not on file   Social Determinants of Health   Financial Resource Strain: Not on file  Food Insecurity: Not on file  Transportation Needs: Not on file  Physical Activity: Not on file  Stress: Not on file  Social Connections: Not on file    Tobacco Counseling Non-smoker Does not drink ETOH beverages No street drug use  Clinical Intake: Pain : No/denies pain    Diabetic? No  Activities of Daily Living In your present state of health, do you have any difficulty performing the following activities: 09/13/2020  Hearing? N  Vision? N  Difficulty concentrating or making decisions? N  Walking or climbing stairs? N  Dressing or bathing? N  Doing errands, shopping? N  Preparing Food and eating ? N  Using the Toilet? N  In the past six months, have you accidently leaked urine? N  Do you have problems with loss of bowel control? N  Managing your Medications? N  Managing your Finances? N  Housekeeping or managing your Housekeeping? N  Some recent data might be hidden    Patient Care Team: Ladell Pier, MD as PCP - General (Internal Medicine) Jerline Pain, MD as PCP - Cardiology (Cardiology) Gaynelle Arabian, MD as Consulting Physician (Orthopedic Surgery) Garlan Fair, MD as Consulting Physician (Gastroenterology) Dr. Herbert Deaner -ophthalmology [place], Mariea Clonts - Podiatry  Indicate any recent Medical Services you may have received from other than [facility] providers in the past year (date may be approximate).     Assessment:   This is a routine wellness examination for Emily [name].  Hearing/Vision screen   Hearing Screening   125Hz  250Hz  500Hz  1000Hz  2000Hz  3000Hz  4000Hz  6000Hz  8000Hz   Right ear:           Left ear:             Visual Acuity Screening   Right eye Left eye Both eyes  Without correction: 20 70 20 50 20 40  With correction:     Had cataract surgery BL about 5 yrs by Dr. Herbert Deaner.  Over due for f/u.  Wears OTC readers  Dietary issues and exercise activities discussed: -Eating habits: feels she can do better with eating habits.  Needs to eat more fresh veggies. Does well with fruit intake -Baby sits her  grandchildren so not going to gym like she use to.  Plans to start walking at a nearby park 3 days a wk  Goals Addressed            This Visit's Progress   . Exercise 3x per week (30 min per time)       "I want to start my walking back." Plans to walk at a nearby park 3 days a wk      Depression Screen PHQ 2/9 Scores 09/13/2020 08/17/2020 06/13/2020 05/06/2018 10/02/2017 07/20/2017 02/16/2017  PHQ - 2 Score 0 0 0 0 0 0 0  PHQ- 9 Score 0 0 - - - - -  Exception Documentation - - - - - - Patient refusal    Fall Risk Fall Risk  09/13/2020 08/17/2020 06/13/2020 05/06/2018 10/02/2017  Falls in the past year? 0 0 0 0 No  Number falls in past yr: 0 0 0 - -  Injury with Fall? 0 0 0 - -  Risk for fall due to : No Fall Risks - - - -    FALL RISK PREVENTION PERTAINING TO THE HOME:  Any stairs in or around the home? Yes  If so, are there any without handrails? No  Home free of loose throw rugs in walkways, pet beds, electrical cords, etc? Yes  Adequate lighting in your home to reduce risk of falls? Yes   ASSISTIVE DEVICES UTILIZED TO PREVENT FALLS:  Life alert? No And does not feel she needs one at this time Use of a cane, walker or w/c? No  Grab bars in the bathroom? No And does not feel she needs one at this time Shower chair or bench in shower? No .  She feels she may benefit from 1 and plans to get one on her own. Elevated toilet seat or a handicapped toilet? No   TIMED UP AND  GO:  Was the test performed? Yes .  Length of time to ambulate 10 feet: 8 sec.   Gait steady and fast without use of assistive device  Cognitive Function: MMSE - Mini Mental State Exam 09/13/2020  Orientation to time 5  Orientation to Place 5  Registration 3  Attention/ Calculation 5  Recall 3  Language- name 2 objects 2  Language- repeat 1  Language- follow 3 step command 3  Language- read & follow direction 1  Write a sentence 1  Copy design 0  Total score 29      Immunizations Immunization History  Administered Date(s) Administered  . Influenza,inj,Quad PF,6+ Mos 01/27/2015, 02/16/2017  . Influenza-Unspecified 01/29/2013, 02/25/2016  . Pneumococcal Conjugate-13 01/27/2015  . Pneumococcal Polysaccharide-23 03/29/2016  . Tdap 06/13/2020  . Zoster Recombinat (Shingrix) 07/31/2016    TDAP status: Up to date  Flu Vaccine status: Declined, Education has been provided regarding the importance of this vaccine but patient still declined. Advised may receive this vaccine at local pharmacy or Health Dept. Aware to provide a copy of the vaccination record if obtained from local pharmacy or Health Dept. Verbalized acceptance and understanding.  Pneumococcal vaccine status: Up to date  Covid-19 vaccine status: Declined, Education has been provided regarding the importance of this vaccine but patient still declined. Advised may receive this vaccine at local pharmacy or Health Dept.or vaccine clinic. Aware to provide a copy of the vaccination record if obtained from local pharmacy or Health Dept. Verbalized acceptance and understanding.  Qualifies for Shingles Vaccine? Yes   Zostavax completed No   Shingrix Completed?: Yes.  Had 2nd shot of Shingrix last mth at [facility]) per her report.  Screening Tests Health Maintenance  Topic Date Due  . COVID-19 Vaccine (1) Never done  . INFLUENZA VACCINE  12/12/2020  . MAMMOGRAM  08/17/2021  . COLONOSCOPY (Pts 45-53yrs  Insurance coverage will need to be confirmed)  04/24/2026  . TETANUS/TDAP  06/13/2030  . DEXA SCAN  Completed  . Hepatitis C Screening  Completed  . PNA vac Low Risk Adult  Completed  . HPV VACCINES  Aged Out    Health Maintenance  Health Maintenance Due  Topic Date Due  . COVID-19 Vaccine (1) Never done    Colorectal cancer screening: Type of screening: Colonoscopy. Completed 04/24/2016. Repeat every 10 years  Mammogram status: Ordered 09/2020. Pt provided with contact info and advised to call to schedule appt.   Bone Density status: Ordered 09/13/2020. Pt provided with contact info and advised to call to schedule appt.  Lung Cancer Screening: (Low Dose CT Chest recommended if Age 37-80 years, 30 pack-year currently smoking OR have quit w/in 15years.) does not qualify.   Lung Cancer Screening Referral: NA  Additional Screening:  Hepatitis C Screening: does qualify; Completed 01/2015  Vision Screening: Recommended annual ophthalmology exams for early detection of glaucoma and other disorders of the eye. Is the patient up to date with their annual eye exam?  Yes  Who is the provider or what is the name of the office in which the patient attends annual eye exams? Dr. Herbert Deaner If pt is not established with a provider, would they like to be referred to a provider to establish care? NA.   Dental Screening: Recommended annual dental exams for proper oral hygiene. Sees a dentist 2 x a year  Community Resource Referral / Chronic Care Management: CRR required this visit?  No   CCM required this visit?  No      Plan:    1. Encounter for Medicare annual wellness exam Discussed and encourage healthy eating habits. Patient has set a goal to start exercising again 3 times a week.  I have encouraged her to get started as soon as possible.  2. Advanced directives, counseling/discussion Discussed with patient advanced directives and the components of an advanced directive including living  will and healthcare power of attorney.  Patient interested in executing advanced directive.  She was given our packet to take home to read over and discuss with her family members.  Recommend that she bring [place] a copy once she has executed the advanced directive.  3. Postmenopausal estrogen deficiency Discussed osteoporosis screening.  She is due.  Last study was done 5 years ago. - DG Bone Density; Future  4. Farsightedness, bilateral - Ambulatory referral to Ophthalmology  5. COVID-19 vaccination declined Encouraged her to get the COVID-19 vaccine.  However she has declined at this time.   I have personally reviewed and noted the following in the patient's chart:   . Medical and social history . Use of alcohol, tobacco or illicit drugs  . Current medications and supplements including opioid prescriptions.  . Functional ability and status . Nutritional status . Physical activity . Advanced directives . List of other physicians . Hospitalizations, surgeries, and ER visits in previous 12 months . Vitals . Screenings to include cognitive, depression, and falls . Referrals and appointments  In addition, I have reviewed and discussed with patient certain preventive protocols, quality metrics, and best practice recommendations. A written personalized care plan for preventive services as well as  general preventive health recommendations were provided to patient.     Karle Plumber, MD   09/13/2020

## 2020-09-15 ENCOUNTER — Ambulatory Visit
Admission: RE | Admit: 2020-09-15 | Discharge: 2020-09-15 | Disposition: A | Discharge status: Home / Self Care | Payer: Medicare HMO | Source: Ambulatory Visit | Attending: Internal Medicine | Admitting: Internal Medicine

## 2020-09-15 ENCOUNTER — Other Ambulatory Visit: Payer: Self-pay

## 2020-09-15 DIAGNOSIS — Z1231 Encounter for screening mammogram for malignant neoplasm of breast: Secondary | ICD-10-CM

## 2020-09-16 ENCOUNTER — Ambulatory Visit: Payer: Medicare HMO

## 2020-09-16 ENCOUNTER — Telehealth: Payer: Self-pay

## 2020-09-16 NOTE — Telephone Encounter (Signed)
Contacted pt to go over mm results pt didn't answer lvm

## 2020-09-24 DIAGNOSIS — Z1152 Encounter for screening for COVID-19: Secondary | ICD-10-CM | POA: Diagnosis not present

## 2020-12-07 ENCOUNTER — Other Ambulatory Visit: Payer: Self-pay | Admitting: Internal Medicine

## 2020-12-07 NOTE — Telephone Encounter (Signed)
Requested medications are due for refill today.  Unknown  Requested medications are on the active medications list.  yes  Last refill. unknown  Future visit scheduled.   no  Notes to clinic.  Historical medication.

## 2020-12-07 NOTE — Telephone Encounter (Signed)
Medication Refill - Medication: atorvastatin (LIPITOR) 10 MG tablet   Has the patient contacted their pharmacy? yes (Agent: If no, request that the patient contact the pharmacy for the refill.) (Agent: If yes, when and what did the pharmacy advise?)contact pcp  Preferred Pharmacy (with phone number or street name):  [id number] [place]  [phone] Fax:  [phone]     Agent: Please be advised that RX refills may take up to 3 business days. We ask that you follow-up with your pharmacy.

## 2020-12-08 MED ORDER — ATORVASTATIN CALCIUM 10 MG PO TABS
10.0000 mg | ORAL_TABLET | Freq: Every day | ORAL | 1 refills | Status: DC
Start: 2020-12-08 — End: 2021-04-26

## 2021-01-27 ENCOUNTER — Ambulatory Visit: Payer: Medicare HMO | Attending: Internal Medicine | Admitting: Internal Medicine

## 2021-01-27 ENCOUNTER — Other Ambulatory Visit: Payer: Self-pay

## 2021-01-27 VITALS — BP 128/94 | HR 76 | Resp 16 | Wt 146.8 lb

## 2021-01-27 DIAGNOSIS — Z23 Encounter for immunization: Secondary | ICD-10-CM | POA: Diagnosis not present

## 2021-01-27 DIAGNOSIS — M25511 Pain in right shoulder: Secondary | ICD-10-CM | POA: Diagnosis not present

## 2021-01-27 DIAGNOSIS — G8929 Other chronic pain: Secondary | ICD-10-CM

## 2021-01-27 DIAGNOSIS — R7303 Prediabetes: Secondary | ICD-10-CM

## 2021-01-27 NOTE — Progress Notes (Signed)
Patient ID: Emily [name], female    DOB: [birth year]  MRN: [id number]  CC: Shoulder Pain   Subjective: Koula Russo is a 70 y.o. female who presents for UC visit Her concerns today include:  Patient with history of prediabetes, anxiety, fibrocystic breast, HL, iron deficiency, COVID-19 infection 02/2020, TMJ  C/o pain in RT shoulder Sometimes it is in the upper arm and elbow. Started in May while in [place] helping to take care of her mother who is bedridden.  She was having to do some lifting and turning of her mother.   Pain in the shoulder is worse when laying down on the opposite shoulder.  She states that it feels better when she lays on the right shoulder.  For the most part she is still able to use the shoulder joint.  She takes Tylenol as needed but not every day.  PreDM:  doing better with eating habits.  She is fairly active.  She she has been helping a friend keep her children.  She walks about 3 times a week with them.  She is still taking her Lipitor as prescribed.  Patient tells me she had called our office sometime last month to report that she had a bump or saw in the vaginal area on the left side.  She is not sure whether it still there or not.  Not sexually active.  Wanting to know whether she needs to have a Pap smear.  HM:  due for flu shot.  Has not had any COVID vaccine. She is skeptical about the vaccine  Patient Active Problem List   Diagnosis Date Noted   Influenza vaccine refused 06/13/2020   Dermatitis 06/13/2020   Mixed hyperlipidemia 06/13/2020   Prediabetes 11/21/2012   Breast fibrocystic disorder 11/21/2012   Chest pain 11/21/2012   Anxiety state, unspecified 11/21/2012   Incidental lung nodule, > 36m and < 846m10/08/2011     Current Outpatient Medications on File Prior to Visit  Medication Sig Dispense Refill   atorvastatin (LIPITOR) 10 MG tablet Take 1 tablet (10 mg total) by mouth daily. 90 tablet 1   ferrous sulfate 325 (65 FE) MG tablet Take  325 mg by mouth daily with breakfast.     Multiple Vitamins-Minerals (EYE VITAMINS PO) Take by mouth.     triamcinolone (KENALOG) 0.1 % Apply 1 application topically 2 (two) times daily. (Patient not taking: Reported on 08/17/2020) 45 g 1   Vitamin D, Cholecalciferol, 25 MCG (1000 UT) CAPS Take by mouth.     No current facility-administered medications on file prior to visit.    Allergies  Allergen Reactions   Latex Itching and Rash    Social History   Socioeconomic History   Marital status: Divorced    Spouse name: Not on file   Number of children: 2   Years of education: Not on file   Highest education level: Some college, no degree  Occupational History   Occupation: Day-care  Tobacco Use   Smoking status: Never   Smokeless tobacco: Never  Vaping Use   Vaping Use: Never used  Substance and Sexual Activity   Alcohol use: No   Drug use: No   Sexual activity: Never  Other Topics Concern   Not on file  Social History Narrative   Not on file   Social Determinants of Health   Financial Resource Strain: Not on file  Food Insecurity: Not on file  Transportation Needs: Not on file  Physical Activity: Not  on file  Stress: Not on file  Social Connections: Not on file  Intimate Partner Violence: Not on file    Family History  Problem Relation Age of Onset   Diabetes Brother    Hypertension Brother    Alcohol abuse Brother    Mental illness Brother        nervous breakdown   Stroke Maternal Grandmother    Depression Paternal Grandmother    Arthritis Mother    COPD Father 43       decsd due to lack of oxygen   Hypertension Father    Alcohol abuse Father    Gout Father    Alcohol abuse Brother    Hypertension Brother     Past Surgical History:  Procedure Laterality Date   BREAST BIOPSY Right 05/28/2016   DENSE STROMAL FIBROSIS    COLONOSCOPY WITH PROPOFOL N/A 04/24/2016   Procedure: COLONOSCOPY WITH PROPOFOL;  Surgeon: Garlan Fair, MD;  Location: [facility]  ENDOSCOPY;  Service: Endoscopy;  Laterality: N/A;   EYE SURGERY Bilateral    lens replacements   FRACTURE SURGERY Right 2003   fibula   surgery for fibroids  2000    ROS: Review of Systems Negative except as stated above  PHYSICAL EXAM: BP (!) 128/94   Pulse 76   Resp 16   Wt 146 lb 12.8 oz (66.6 kg)   SpO2 96%   BMI 27.29 kg/m   Physical Exam  General appearance - alert, well appearing, elderly African-American female and in no distress Mental status - normal mood, behavior, speech, dress, motor activity, and thought processes Chest - clear to auscultation, no wheezes, rales or rhonchi, symmetric air entry Heart - normal rate, regular rhythm, normal S1, S2, no murmurs, rubs, clicks or gallops Pelvic -[employer] student Leamon Arnt present for exam: No external vaginal lesions seen. Musculoskeletal -right shoulder: No tenderness on palpation of the anterior posterior joint.  She has mild discomfort with passive range of motion especially with attempted elevation.   CMP Latest Ref Rng & Units 06/13/2020 05/06/2018 07/20/2017  Glucose 65 - 99 mg/dL 99 94 89  BUN 8 - 27 mg/dL '23 19 18  '$ Creatinine 0.57 - 1.00 mg/dL 0.76 0.59 0.70  Sodium 134 - 144 mmol/L 144 138 140  Potassium 3.5 - 5.2 mmol/L 4.2 3.8 3.9  Chloride 96 - 106 mmol/L 103 102 101  CO2 20 - 29 mmol/L '24 24 26  '$ Calcium 8.7 - 10.3 mg/dL 9.3 9.3 9.2  Total Protein 6.0 - 8.5 g/dL 7.6 6.6 7.0  Total Bilirubin 0.0 - 1.2 mg/dL 0.2 0.4 <0.2  Alkaline Phos 44 - 121 IU/L 72 56 57  AST 0 - 40 IU/L '22 15 23  '$ ALT 0 - 32 IU/L '18 18 19   '$ Lipid Panel     Component Value Date/Time   CHOL 198 06/13/2020 0930   TRIG 45 06/13/2020 0930   HDL 99 06/13/2020 0930   CHOLHDL 2.0 06/13/2020 0930   CHOLHDL 2.1 02/23/2016 0918   VLDL 8 02/23/2016 0918   LDLCALC 90 06/13/2020 0930    CBC    Component Value Date/Time   WBC 4.7 06/13/2020 0930   WBC 4.4 (A) 07/20/2017 1455   WBC 4.4 08/18/2015 1050   RBC 4.63 06/13/2020 0930   RBC 4.43  07/20/2017 1455   RBC 4.29 08/18/2015 1050   HGB 12.5 06/13/2020 0930   HCT 39.1 06/13/2020 0930   PLT 269 06/13/2020 0930   MCV 84 06/13/2020 0930  MCH 27.0 06/13/2020 0930   MCH 26.5 (A) 07/20/2017 1455   MCH 27.3 08/18/2015 1050   MCHC 32.0 06/13/2020 0930   MCHC 32.0 07/20/2017 1455   MCHC 31.8 (L) 08/18/2015 1050   RDW 13.1 06/13/2020 0930   LYMPHSABS 1,496 08/18/2015 1050   MONOABS 484 08/18/2015 1050   EOSABS 88 08/18/2015 1050   BASOSABS 0 08/18/2015 1050    ASSESSMENT AND PLAN: 1. Chronic right shoulder pain Pain started in August after she was doing a lot of lifting, pushing and pulling and helping to take care of her mother who is bed ridden.  Differential diagnosis includes arthritis, subacromial tendinitis or other pathology.  We will get an x-ray.  Patient declined referral to orthopedics.  She prefers to do some physical therapy. Continue Tylenol as needed. - DG Shoulder Right; Future - Ambulatory referral to Physical Therapy  2. Need for immunization against influenza - Flu Vaccine QUAD 65moIM (Fluarix, Fluzone & Alfiuria Quad PF)  3. Prediabetes Encouraged her to continue healthy eating habits and regular exercise. - Hemoglobin A1c  Reassurance given from examination of the vaginal area.  No abnormal lesions seen externally.  Patient was given the opportunity to ask questions.  Patient verbalized understanding of the plan and was able to repeat key elements of the plan.   Orders Placed This Encounter  Procedures   Flu Vaccine QUAD 622moM (Fluarix, Fluzone & Alfiuria Quad PF)     Requested Prescriptions    No prescriptions requested or ordered in this encounter    No follow-ups on file.  DeKarle PlumberMD, FACP

## 2021-01-28 LAB — HEMOGLOBIN A1C
Est. average glucose Bld gHb Est-mCnc: 131 mg/dL
Hgb A1c MFr Bld: 6.2 % — ABNORMAL HIGH (ref 4.8–5.6)

## 2021-01-30 ENCOUNTER — Telehealth: Payer: Self-pay

## 2021-01-30 NOTE — Telephone Encounter (Signed)
Contacted pt to go over lab results pt didn't answer lvm asking pt to give me a call at her/his earliest convenience   Sent a CRM and forward labs to NT to give pt labs when they call back

## 2021-02-08 ENCOUNTER — Ambulatory Visit (HOSPITAL_COMMUNITY)
Admission: RE | Admit: 2021-02-08 | Discharge: 2021-02-08 | Disposition: A | Discharge status: Home / Self Care | Payer: Medicare HMO | Source: Ambulatory Visit | Attending: Internal Medicine | Admitting: Internal Medicine

## 2021-02-08 ENCOUNTER — Other Ambulatory Visit: Payer: Self-pay

## 2021-02-08 DIAGNOSIS — M25511 Pain in right shoulder: Secondary | ICD-10-CM | POA: Insufficient documentation

## 2021-02-08 DIAGNOSIS — G8929 Other chronic pain: Secondary | ICD-10-CM | POA: Diagnosis not present

## 2021-02-14 ENCOUNTER — Telehealth: Payer: Self-pay

## 2021-02-14 NOTE — Telephone Encounter (Signed)
Contacted pt to go over lab results pt didn't answer lvm   Sent a CRM and forward labs to NT to give pt labs when they call back   

## 2021-02-15 NOTE — Telephone Encounter (Signed)
Patient called back- she has decided not to go on her trip- she wants to go ahead and schedule her PT- patient advised they have her referral and she may call them to schedule- they have been trying to reach her.

## 2021-02-25 ENCOUNTER — Other Ambulatory Visit: Payer: Self-pay

## 2021-02-25 ENCOUNTER — Encounter: Payer: Self-pay | Admitting: Rehabilitative and Restorative Service Providers"

## 2021-02-25 ENCOUNTER — Ambulatory Visit: Payer: Medicare HMO | Attending: Internal Medicine | Admitting: Rehabilitative and Restorative Service Providers"

## 2021-02-25 DIAGNOSIS — R293 Abnormal posture: Secondary | ICD-10-CM | POA: Diagnosis not present

## 2021-02-25 DIAGNOSIS — G8929 Other chronic pain: Secondary | ICD-10-CM | POA: Diagnosis not present

## 2021-02-25 DIAGNOSIS — M6281 Muscle weakness (generalized): Secondary | ICD-10-CM | POA: Diagnosis not present

## 2021-02-25 DIAGNOSIS — M25511 Pain in right shoulder: Secondary | ICD-10-CM | POA: Diagnosis not present

## 2021-02-25 NOTE — Patient Instructions (Addendum)
   Access Code: [email or web address] URL: [email or web address] Date: 02/25/2021 Prepared by: Myra Rude  Exercises Single Arm Doorway Pec Stretch at 90 Degrees Abduction - 2 x daily - 7 x weekly - 1 sets - 2 reps - 30 sec hold Gentle Levator Scapulae Stretch - 2 x daily - 7 x weekly - 1 sets - 2 reps - 30 sec hold  Advised pt to work on posture; showed pt posture in the mirror

## 2021-02-25 NOTE — Therapy (Signed)
[place], [place], [address] Phone: [phone]   Fax:  [phone]  Physical Therapy Evaluation  Patient Details  Name: Emily [name] MRN: [id number] Date of Birth: 21-Jan-1951 Referring Provider (PT): Dr. Karle Plumber, MD   Encounter Date: 02/25/2021   PT End of Session - 02/25/21 0850     Visit Number 1    Number of Visits 12    Date for PT Re-Evaluation 04/08/21    Authorization Type [employer] MCR    Progress Note Due on Visit 10    PT Start Time 0853    PT Stop Time 0943    PT Time Calculation (min) 50 min    Activity Tolerance Patient tolerated treatment well    Behavior During Therapy [facility] for tasks assessed/performed             Past Medical History:  Diagnosis Date   Anemia    iron runs low   Anxiety    Anxiety state, unspecified 11/21/2012   Breast fibrocystic disorder 11/21/2012   Bronchitis    Cataract    Chest pain 11/21/2012   Normal stress echocardiogram 11/2010    History of chicken pox    History of uterine fibroid    Incidental lung nodule, > 24mm and < 93mm 02/15/2012   13mm RUL subpleural nodule found on CXR screening for TB due to h/o TB exposure as a child with positive ppd.  Recheck CT scan in 6-12 mos (March - Sept 2014) Repeat CT scan 01/2013 shows: Stable subpleural nodule in the right upper lobe measuring 5 mm. If this patient is at low risk for lung cancer then no further workup is necessary.  If this patient is at increased risk for lung cancer then foll   Nontraumatic tear of skin    right side of vagina skin since 06-12-15, small amount of blood when wipes   Prediabetes 11/21/2012   Tuberculosis    exposed as a young child    Past Surgical History:  Procedure Laterality Date   BREAST BIOPSY Right 05/28/2016   DENSE STROMAL FIBROSIS    COLONOSCOPY WITH PROPOFOL N/A 04/24/2016   Procedure: COLONOSCOPY WITH PROPOFOL;  Surgeon: Garlan Fair, MD;  Location: [facility]  ENDOSCOPY;  Service: Endoscopy;  Laterality: N/A;   EYE SURGERY Bilateral    lens replacements   FRACTURE SURGERY Right 2003   fibula   surgery for fibroids  2000    There were no vitals filed for this visit.    Subjective Assessment - 02/25/21 0858     Subjective My right shoulder pain has been going on since before March. 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 As soon as I lay down the pain comes; it is like a toothache. It is cold just at the shoulder shoulder. I have grandkids that I have to care for as well but i don't have to lift them anymore.    Limitations Lifting;House hold activities    How long can you sit comfortably? 6 hours    How long can you stand comfortably? unknown    How long can you walk comfortably? unknown    Diagnostic tests Xray with negative findings    Patient Stated Goals to use my arm without pain    Currently in Pain? No/denies    Pain Score 0-No pain    Pain Location Shoulder    Pain Orientation Right  Pain Descriptors / Indicators Dull;Throbbing    Pain Type Chronic pain    Pain Radiating Towards lateroanterior axilla/shoulder with occasional radiation to elbow    Pain Onset More than a month ago    Pain Frequency Intermittent    Aggravating Factors  left sidelying, carrying items, usage    Pain Relieving Factors Tylenol, R sidelying, rest    Effect of Pain on Daily Activities patient modifies activities but still performs with discomfort    Multiple Pain Sites No                [facility] PT Assessment - 02/25/21 0001       Assessment   Medical Diagnosis R shoulder pain    Referring Provider (PT) Dr. Karle Plumber, MD    Onset Date/Surgical Date 08/10/20    Hand Dominance Right    Next MD Visit TBD    Prior Therapy none      Precautions   Precautions None      Restrictions   Weight Bearing Restrictions No      Balance Screen   Has the patient fallen in the past 6 months No      [place] residence      Prior Function   Level of Independence Independent      Cognition   Overall Cognitive Status Within Functional Limits for tasks assessed      Sensation   Light Touch Appears Intact      Coordination   Gross Motor Movements are Fluid and Coordinated Yes      Posture/Postural Control   Posture Comments rounded shoulders, thoracic curvature to the right, R scapula heigher, scapula = from spine; R humeral head lower; R clavicle lower; thoracic kyphosis      ROM / Strength   AROM / PROM / Strength AROM;Strength;PROM      AROM   Overall AROM Comments WFL R shoulder but with pain at end ROM      PROM   Overall PROM Comments WNL with pain at end PROM flexion      Strength   Overall Strength Comments R shoulder flex 3-/5, abdct 3/5, IR 4+/5, ER 3-/5, elbow flex/ext 3/5.      Palpation   Palpation comment biceps TTP especially proximally; R Upper Trap tighter      Special Tests   Other special tests Empty can +, Impingement test +; Lift off test + for discomfort with pt substituting and forward flexing, Sulcus -, Relocation -                        Objective measurements completed on examination: See above findings.                     PT Long Term Goals - 02/25/21 0947       PT LONG TERM GOAL #1   Title Pt will be indep with HEP in prep for discharge for R shoulder pain management    Baseline issued at eval    Time 6    Period Weeks    Status New    Target Date 04/08/21      PT LONG TERM GOAL #2   Title Pt will be able to carry grocery bags with </=3/10 R shoulder pain    Baseline unable without pain >/=5/10    Time 6    Period Weeks    Status New  Target Date 04/08/21      PT LONG TERM GOAL #3   Title Pt will be able to lay in L sidelying without R shoulder pain awakening her x 1 hour    Baseline unable    Time 6    Period Weeks    Status New    Target Date 04/08/21       PT LONG TERM GOAL #4   Title Pt will be able to perform yardwork x 1 hour without R shoulder pain    Baseline 15 min    Time 6    Period Weeks    Status New    Target Date 04/08/21      PT LONG TERM GOAL #5   Title Pt will improve foto score to 63% function to assist with iADLs    Baseline 45%    Time 6    Period Weeks    Status New    Target Date 04/08/21      Additional Long Term Goals   Additional Long Term Goals Yes      PT LONG TERM GOAL #6   Title Pt will demo improved R shoulder strength >/=1 MMT grade to assist with functional activities    Baseline R shoulder flex 3-/5, abdct 3/5, IR 4+/5, ER 3-/5, elbow flex/ext 3/5.    Time 6    Period Weeks    Status New    Target Date 04/08/21                    Plan - 02/25/21 0946     Clinical Impression Statement Pt presents to PT with R shoulder biceps tendonitis, impingement syndrome R and poor posture. Pt would benefit from further PT to address R biceps/shoulder pain and postural strengthening/stretching.    Personal Factors and Comorbidities Other;Comorbidity 1;Comorbidity 2   allergic to latex   Comorbidities fibrotic R breast, allergic to latex    Examination-Activity Limitations Lift;Caring for Others;Reach Overhead;Carry;Sleep    Examination-Participation Restrictions Laundry;Shop;Cleaning;Community Activity;Driving    Stability/Clinical Decision Making Evolving/Moderate complexity    Clinical Decision Making Moderate    Rehab Potential Good    PT Frequency 2x / week    PT Duration 6 weeks    PT Treatment/Interventions Cryotherapy;Electrical Stimulation;Iontophoresis 4mg /ml Dexamethasone;Moist Heat;Ultrasound;Functional mobility training;Therapeutic activities;Therapeutic exercise;Patient/family education;Manual techniques;Taping;Dry needling;Passive range of motion;Vasopneumatic Device    PT Next Visit Plan review HEP; pec stretching, thoracic strengthening, R Upper Trap stretching, discussed various  options for sleeping positions for R shoulder (L sidelying painful for R shoulder due to draping across body), [place] to R biceps tendon, allergic to latex    PT Home Exercise Plan Access Code: [email or web address]    Consulted and Agree with Plan of Care Patient             Patient will benefit from skilled therapeutic intervention in order to improve the following deficits and impairments:  Decreased activity tolerance, Decreased endurance, Decreased range of motion, Decreased mobility, Decreased strength, Impaired flexibility, Impaired UE functional use, Postural dysfunction, Pain  Visit Diagnosis: Chronic right shoulder pain  Abnormal posture  Muscle weakness (generalized)     Problem List Patient Active Problem List   Diagnosis Date Noted   Influenza vaccine refused 06/13/2020   Dermatitis 06/13/2020   Mixed hyperlipidemia 06/13/2020   Prediabetes 11/21/2012   Breast fibrocystic disorder 11/21/2012   Chest pain 11/21/2012   Anxiety state, unspecified 11/21/2012   Incidental lung nodule, > 75mm and < 54mm 02/15/2012  America Brown, PT 02/25/2021, 12:05 PM  [facility] [address] [place], [place], [address] Phone: [phone]   Fax:  [phone]  Name: Emily [name] MRN: [id number] Date of Birth: [birth year]

## 2021-03-07 ENCOUNTER — Ambulatory Visit: Payer: Self-pay | Admitting: *Deleted

## 2021-03-07 NOTE — Telephone Encounter (Signed)
Pt reports loose stools, onset Friday, 4 days ago. States 2-3 daily, "Loose, at times watery." States had coffee with Half and Half in it Friday, "When this all started." Reports yesterday and this AM had soft stool "Not loose but soft."  Also reports increased fatigue and one episode of urinary incontinence this AM while asleep. Denies dysuria, no other associated urinary symptoms. Denies fever, no abdominal pain, states is staying hydrated. HAs been taking Kaopectate 2-3 times daily. Tested negative for covid Sunday.  Pt is out of state until Friday. Would like advise from Dr. Wynetta Emery. Assured pt NT would route to practice for PCPs review and final disposition. Advised UC/ED for worsening symptoms. Pt verbalizes understanding. Please advise: [phone]

## 2021-03-07 NOTE — Telephone Encounter (Signed)
   Patient called in with diarrhea problem, all week, wants to know what she can do. Please call back .     Reason for Disposition  [1] MODERATE diarrhea (e.g., 4-6 times / day more than normal) AND [2] age > 70 years    2-3 times daily, ad 2 "Soft" stools x 2 days  Answer Assessment - Initial Assessment Questions 1. DIARRHEA SEVERITY: "How bad is the diarrhea?" "How many more stools have you had in the past 24 hours than normal?"    - NO DIARRHEA (SCALE 0)   - MILD (SCALE 1-3): Few loose or mushy BMs; increase of 1-3 stools over normal daily number of stools; mild increase in ostomy output.   -  MODERATE (SCALE 4-7): Increase of 4-6 stools daily over normal; moderate increase in ostomy output. * SEVERE (SCALE 8-10; OR 'WORST POSSIBLE'): Increase of 7 or more stools daily over normal; moderate increase in ostomy output; incontinence.     Mild, mostly watery. 2. ONSET: "When did the diarrhea begin?"      Friday 3. BM CONSISTENCY: "How loose or watery is the diarrhea?"      Soft this AM, loose at times 4. VOMITING: "Are you also vomiting?" If Yes, ask: "How many times in the past 24 hours?"      no 5. ABDOMINAL PAIN: "Are you having any abdominal pain?" If Yes, ask: "What does it feel like?" (e.g., crampy, dull, intermittent, constant)      no 6. ABDOMINAL PAIN SEVERITY: If present, ask: "How bad is the pain?"  (e.g., Scale 1-10; mild, moderate, or severe)   - MILD (1-3): doesn't interfere with normal activities, abdomen soft and not tender to touch    - MODERATE (4-7): interferes with normal activities or awakens from sleep, abdomen tender to touch    - SEVERE (8-10): excruciating pain, doubled over, unable to do any normal activities       NA 7. ORAL INTAKE: If vomiting, "Have you been able to drink liquids?" "How much liquids have you had in the past 24 hours?"     Yes 8. HYDRATION: "Any signs of dehydration?" (e.g., dry mouth [not just dry lips], too weak to stand, dizziness, new  weight loss) "When did you last urinate?"     no  10. ANTIBIOTIC USE: "Are you taking antibiotics now or have you taken antibiotics in the past 2 months?"       no 11. OTHER SYMPTOMS: "Do you have any other symptoms?" (e.g., fever, blood in stool)       Incontinent urine, one episode this AM.  Protocols used: Diarrhea-A-AH

## 2021-03-11 ENCOUNTER — Ambulatory Visit: Payer: Medicare HMO | Admitting: Physical Therapy

## 2021-03-11 ENCOUNTER — Other Ambulatory Visit: Payer: Self-pay

## 2021-03-11 ENCOUNTER — Encounter: Payer: Self-pay | Admitting: Physical Therapy

## 2021-03-11 DIAGNOSIS — M25511 Pain in right shoulder: Secondary | ICD-10-CM | POA: Diagnosis not present

## 2021-03-11 DIAGNOSIS — G8929 Other chronic pain: Secondary | ICD-10-CM | POA: Diagnosis not present

## 2021-03-11 DIAGNOSIS — M6281 Muscle weakness (generalized): Secondary | ICD-10-CM

## 2021-03-11 DIAGNOSIS — R293 Abnormal posture: Secondary | ICD-10-CM

## 2021-03-11 NOTE — Therapy (Signed)
[place], [place], [address] Phone: [phone]   Fax:  [phone]  Physical Therapy Treatment  Patient Details  Name: Emily [name] MRN: [id number] Date of Birth: 04/04/1951 Referring Provider (PT): Dr. Karle Plumber, MD   Encounter Date: 03/11/2021   PT End of Session - 03/11/21 0956     Visit Number 2    Number of Visits 12    Date for PT Re-Evaluation 04/08/21    Authorization Type [employer] MCR    Progress Note Due on Visit 10    PT Start Time 0904    PT Stop Time 0949    PT Time Calculation (min) 45 min    Activity Tolerance Patient tolerated treatment well    Behavior During Therapy Emily [name] for tasks assessed/performed             Past Medical History:  Diagnosis Date   Anemia    iron runs low   Anxiety    Anxiety state, unspecified 11/21/2012   Breast fibrocystic disorder 11/21/2012   Bronchitis    Cataract    Chest pain 11/21/2012   Normal stress echocardiogram 11/2010    History of chicken pox    History of uterine fibroid    Incidental lung nodule, > 13mm and < 47mm 02/15/2012   66mm RUL subpleural nodule found on CXR screening for TB due to h/o TB exposure as a child with positive ppd.  Recheck CT scan in 6-12 mos (March - Sept 2014) Repeat CT scan 01/2013 shows: Stable subpleural nodule in the right upper lobe measuring 5 mm. If this patient is at low risk for lung cancer then no further workup is necessary.  If this patient is at increased risk for lung cancer then foll   Nontraumatic tear of skin    right side of vagina skin since 06-12-15, small amount of blood when wipes   Prediabetes 11/21/2012   Tuberculosis    exposed as a young child    Past Surgical History:  Procedure Laterality Date   BREAST BIOPSY Right 05/28/2016   DENSE STROMAL FIBROSIS    COLONOSCOPY WITH PROPOFOL N/A 04/24/2016   Procedure: COLONOSCOPY WITH PROPOFOL;  Surgeon: Garlan Fair, MD;  Location: [facility]  ENDOSCOPY;  Service: Endoscopy;  Laterality: N/A;   EYE SURGERY Bilateral    lens replacements   FRACTURE SURGERY Right 2003   fibula   surgery for fibroids  2000    There were no vitals filed for this visit.   Subjective Assessment - 03/11/21 0926     Subjective Emily [name] returns for her first follow up visit since initial evaluation 2 weeks ago. She had traveled out of town and today notes shoulder soreness from taking care of her grandchildren. She reports some neck soreness from stretch for HEP (see plan/assessment).    Currently in Pain? Yes    Pain Score 3     Pain Location Shoulder    Pain Orientation Right    Pain Descriptors / Indicators Dull;Throbbing    Pain Type Chronic pain    Pain Onset More than a month ago    Pain Frequency Intermittent    Aggravating Factors  left sidelying, reaching and carrying activities    Pain Relieving Factors Tylenol, R sidelying, rest    Effect of Pain on Daily Activities Limits ability and tolerance for ADLs/IADLs involving reaching and lifting  [facility] [facility] PT Treatment/Exercise - 03/11/21 0001       Exercises   Exercises Shoulder;Elbow      Elbow Exercises   Other elbow exercises Right bicep low load isometric at 90 deg elbow flexion 3x30 seconds      Shoulder Exercises: Supine   Other Supine Exercises supine wand flexion AAROM 2x5 reps      Shoulder Exercises: Seated   Other Seated Exercises HEP review for form for levator stretch and demo/brief practice upper trapezius stretch      Shoulder Exercises: Sidelying   External Rotation AROM;Strengthening;Right;15 reps    ABduction Limitations Right scaption to 90 deg x 15 reps      Shoulder Exercises: Standing   Other Standing Exercises Left shoulder ER isometric 3x30 seconds      Manual Therapy   Manual Therapy Joint mobilization;Soft tissue mobilization    Joint Mobilization Right GH AP mobilization initial grade 1-2  progressed to grade 3    Soft tissue mobilization STM right bicep, lateral pec and posterior scapular region                     PT Education - 03/11/21 0955     Education Details HEP review and updates, Theracane use for right upper trapezius region STM    Person(s) Educated Patient    Methods Explanation;Demonstration;Verbal cues;Handout    Comprehension Verbalized understanding;Returned demonstration;Verbal cues required                 PT Long Term Goals - 02/25/21 0947       PT LONG TERM GOAL #1   Title Pt will be indep with HEP in prep for discharge for R shoulder pain management    Baseline issued at eval    Time 6    Period Weeks    Status New    Target Date 04/08/21      PT LONG TERM GOAL #2   Title Pt will be able to carry grocery bags with </=3/10 R shoulder pain    Baseline unable without pain >/=5/10    Time 6    Period Weeks    Status New    Target Date 04/08/21      PT LONG TERM GOAL #3   Title Pt will be able to lay in L sidelying without R shoulder pain awakening her x 1 hour    Baseline unable    Time 6    Period Weeks    Status New    Target Date 04/08/21      PT LONG TERM GOAL #4   Title Pt will be able to perform yardwork x 1 hour without R shoulder pain    Baseline 15 min    Time 6    Period Weeks    Status New    Target Date 04/08/21      PT LONG TERM GOAL #5   Title Pt will improve foto score to 63% function to assist with iADLs    Baseline 45%    Time 6    Period Weeks    Status New    Target Date 04/08/21      Additional Long Term Goals   Additional Long Term Goals Yes      PT LONG TERM GOAL #6   Title Pt will demo improved R shoulder strength >/=1 MMT grade to assist with functional activities    Baseline R shoulder flex 3-/5, abdct 3/5, IR 4+/5, ER 3-/5, elbow  flex/ext 3/5.    Time 6    Period Weeks    Status New    Target Date 04/08/21                   Plan - 03/11/21 0956     Clinical  Impression Statement At visit 2 reviewed HEP from eval-suspect neck soreness had been from pt. performing levator stretch on contralateral side so reviewed form for right side and included upper trapezius as well depending on region of symptoms for muscle tension. For tendinopathy added low load long duration (30 second) isometric holds to help with pain relief and initiate tissue healing/remodeling wtih mild soreness but overall good tolerance. Included manual therapy for pain relief (with joint mobs) and to address myofascial component of symptoms with good response on improved exercise tolerance following intervention. Given chronicity of symptoms expect progress to be gradual but plan continue PT for further efforts to help relieve pain and address associated functional limitations for reaching and lifting activities.    Personal Factors and Comorbidities Other;Comorbidity 1;Comorbidity 2    Comorbidities fibrotic R breast, allergic to latex    Examination-Activity Limitations Lift;Caring for Others;Reach Overhead;Carry;Sleep    Examination-Participation Restrictions Laundry;Shop;Cleaning;Community Activity;Driving    Stability/Clinical Decision Making Evolving/Moderate complexity    Clinical Decision Making Moderate    Rehab Potential Good    PT Frequency 2x / week    PT Duration 6 weeks    PT Treatment/Interventions Cryotherapy;Electrical Stimulation;Iontophoresis 4mg /ml Dexamethasone;Moist Heat;Ultrasound;Functional mobility training;Therapeutic activities;Therapeutic exercise;Patient/family education;Manual techniques;Taping;Dry needling;Passive range of motion;Vasopneumatic Device    PT Next Visit Plan review HEP updates as needed, continue shoulder ROM/strengthening and isometrics vs. progress to eccentric for bicep/shoulder as tolerated, continue manual as found beneficial, had discussed potential dry needling but pt. wishes to hold for now so would plan STM for myofascial component of  symptoms/further inclusion of manual therapy prn for pain    PT Home Exercise Plan Access Code: [email or web address]    Consulted and Agree with Plan of Care Patient             Patient will benefit from skilled therapeutic intervention in order to improve the following deficits and impairments:  Decreased activity tolerance, Decreased endurance, Decreased range of motion, Decreased mobility, Decreased strength, Impaired flexibility, Impaired UE functional use, Postural dysfunction, Pain  Visit Diagnosis: Chronic right shoulder pain  Abnormal posture  Muscle weakness (generalized)     Problem List Patient Active Problem List   Diagnosis Date Noted   Influenza vaccine refused 06/13/2020   Dermatitis 06/13/2020   Mixed hyperlipidemia 06/13/2020   Prediabetes 11/21/2012   Breast fibrocystic disorder 11/21/2012   Chest pain 11/21/2012   Anxiety state, unspecified 11/21/2012   Incidental lung nodule, > 54mm and < 79mm 02/15/2012   Beaulah Dinning, PT, DPT 03/11/21 10:03 AM   [place] [facility] [name] [address] [place], [place], [address] Phone: [phone]   Fax:  [phone]  Name: Emily [name] MRN: [id number] Date of Birth: October 13, 1950

## 2021-03-14 ENCOUNTER — Ambulatory Visit: Payer: Medicare HMO

## 2021-03-14 ENCOUNTER — Other Ambulatory Visit: Payer: Self-pay

## 2021-03-14 ENCOUNTER — Ambulatory Visit: Payer: Medicare HMO | Attending: Internal Medicine

## 2021-03-14 DIAGNOSIS — M6281 Muscle weakness (generalized): Secondary | ICD-10-CM | POA: Diagnosis not present

## 2021-03-14 DIAGNOSIS — G8929 Other chronic pain: Secondary | ICD-10-CM | POA: Insufficient documentation

## 2021-03-14 DIAGNOSIS — R293 Abnormal posture: Secondary | ICD-10-CM | POA: Insufficient documentation

## 2021-03-14 DIAGNOSIS — M25511 Pain in right shoulder: Secondary | ICD-10-CM | POA: Insufficient documentation

## 2021-03-15 NOTE — Therapy (Signed)
[place], [place], [address] Phone: [phone]   Fax:  [phone]  Physical Therapy Treatment  Patient Details  Name: Emily [name] MRN: [id number] Date of Birth: Sep 13, 1950 Referring Provider (PT): Dr. Karle Plumber, MD   Encounter Date: 03/14/2021   PT End of Session - 03/15/21 0555     Visit Number 3    Number of Visits 12    Date for PT Re-Evaluation 04/08/21    Authorization Type [employer] MCR    Progress Note Due on Visit 10    PT Start Time 0848    PT Stop Time 0932    PT Time Calculation (min) 44 min    Activity Tolerance Patient tolerated treatment well    Behavior During Therapy [facility] for tasks assessed/performed             Past Medical History:  Diagnosis Date   Anemia    iron runs low   Anxiety    Anxiety state, unspecified 11/21/2012   Breast fibrocystic disorder 11/21/2012   Bronchitis    Cataract    Chest pain 11/21/2012   Normal stress echocardiogram 11/2010    History of chicken pox    History of uterine fibroid    Incidental lung nodule, > 39mm and < 9mm 02/15/2012   26mm RUL subpleural nodule found on CXR screening for TB due to h/o TB exposure as a child with positive ppd.  Recheck CT scan in 6-12 mos (March - Sept 2014) Repeat CT scan 01/2013 shows: Stable subpleural nodule in the right upper lobe measuring 5 mm. If this patient is at low risk for lung cancer then no further workup is necessary.  If this patient is at increased risk for lung cancer then foll   Nontraumatic tear of skin    right side of vagina skin since 06-12-15, small amount of blood when wipes   Prediabetes 11/21/2012   Tuberculosis    exposed as a young child    Past Surgical History:  Procedure Laterality Date   BREAST BIOPSY Right 05/28/2016   DENSE STROMAL FIBROSIS    COLONOSCOPY WITH PROPOFOL N/A 04/24/2016   Procedure: COLONOSCOPY WITH PROPOFOL;  Surgeon: Garlan Fair, MD;  Location: [facility]  ENDOSCOPY;  Service: Endoscopy;  Laterality: N/A;   EYE SURGERY Bilateral    lens replacements   FRACTURE SURGERY Right 2003   fibula   surgery for fibroids  2000    There were no vitals filed for this visit.   Subjective Assessment - 03/14/21 0901     Subjective Pt reports she is using less tylenol at night, so she feels like the pain is improving    Patient Stated Goals to use my arm without pain    Currently in Pain? Yes    Pain Score 3     Pain Location Shoulder    Pain Orientation Right    Pain Descriptors / Indicators Dull;Throbbing    Pain Type Chronic pain    Pain Onset More than a month ago                               [facility] Adult PT Treatment/Exercise - 03/15/21 0001       Exercises   Exercises Shoulder;Elbow      Elbow Exercises   Other elbow exercises --      Shoulder Exercises: Supine   Flexion --    Other  Supine Exercises supine wand flexion AAROM 2x5 reps      Shoulder Exercises: Sidelying   External Rotation Right;10 reps   2 sets   ABduction Limitations Right scaption to 90 deg  2x10 reps      Shoulder Exercises: Standing   Flexion Right;10 reps   table top, 2 sets     Shoulder Exercises: Isometric Strengthening   External Rotation Limitations 3x30 sec    Other Isometric Exercises R bicep isometric, 3x30 sec      Shoulder Exercises: Stretch   Other Shoulder Stretches Upper trap and levator scap. stretches 20 sec, x2 each      Manual Therapy   Manual Therapy Soft tissue mobilization    Soft tissue mobilization STM to the R bicep and cross friction massage to the R biceptal tendon as tolerated                     PT Education - 03/15/21 0552     Education Details Education for sleeping positions for comfort and support for managing pain and for more restful sleep, and Cross friction massage to the R bicepital tendon 3-5 mins, 2-3x daily    Person(s) Educated Patient    Methods Explanation;Demonstration;Tactile  cues;Verbal cues;Handout    Comprehension Verbalized understanding;Returned demonstration;Verbal cues required;Tactile cues required                 PT Long Term Goals - 02/25/21 0947       PT LONG TERM GOAL #1   Title Pt will be indep with HEP in prep for discharge for R shoulder pain management    Baseline issued at eval    Time 6    Period Weeks    Status New    Target Date 04/08/21      PT LONG TERM GOAL #2   Title Pt will be able to carry grocery bags with </=3/10 R shoulder pain    Baseline unable without pain >/=5/10    Time 6    Period Weeks    Status New    Target Date 04/08/21      PT LONG TERM GOAL #3   Title Pt will be able to lay in L sidelying without R shoulder pain awakening her x 1 hour    Baseline unable    Time 6    Period Weeks    Status New    Target Date 04/08/21      PT LONG TERM GOAL #4   Title Pt will be able to perform yardwork x 1 hour without R shoulder pain    Baseline 15 min    Time 6    Period Weeks    Status New    Target Date 04/08/21      PT LONG TERM GOAL #5   Title Pt will improve foto score to 63% function to assist with iADLs    Baseline 45%    Time 6    Period Weeks    Status New    Target Date 04/08/21      Additional Long Term Goals   Additional Long Term Goals Yes      PT LONG TERM GOAL #6   Title Pt will demo improved R shoulder strength >/=1 MMT grade to assist with functional activities    Baseline R shoulder flex 3-/5, abdct 3/5, IR 4+/5, ER 3-/5, elbow flex/ext 3/5.    Time 6    Period Weeks    Status New  Target Date 04/08/21                   Plan - 03/15/21 0556     Clinical Impression Statement PT was continued for isometric strengthening, lower neck and upper shoulder flexibility. Educationwas provided for sleeping positions/R UE support for more restful sleep and cross friction massage for tissue healing/remodeling. Pt returned demonstration for cross friction massage. Pt tolerated  the session well without increase of R shoulder pain above baseline. Pt's subjective report indicates initial improvement of R shoulder pain.    Personal Factors and Comorbidities Other;Comorbidity 1;Comorbidity 2    Comorbidities fibrotic R breast, allergic to latex    Examination-Activity Limitations Lift;Caring for Others;Reach Overhead;Carry;Sleep    Examination-Participation Restrictions Laundry;Shop;Cleaning;Community Activity;Driving    Stability/Clinical Decision Making Evolving/Moderate complexity    Clinical Decision Making Moderate    Rehab Potential Good    PT Frequency 2x / week    PT Duration 6 weeks    PT Treatment/Interventions Cryotherapy;Electrical Stimulation;Iontophoresis 4mg /ml Dexamethasone;Moist Heat;Ultrasound;Functional mobility training;Therapeutic activities;Therapeutic exercise;Patient/family education;Manual techniques;Taping;Dry needling;Passive range of motion;Vasopneumatic Device    PT Next Visit Plan review HEP updates as needed, continue shoulder ROM/strengthening and isometrics vs. progress to eccentric for bicep/shoulder as tolerated, continue manual as found beneficial, had discussed potential dry needling but pt. wishes to hold for now so would plan STM for myofascial component of symptoms/further inclusion of manual therapy prn for pain. Review sleeping positions and cross friction massage.    PT Home Exercise Plan Access Code: [email or web address]    Consulted and Agree with Plan of Care Patient             Patient will benefit from skilled therapeutic intervention in order to improve the following deficits and impairments:  Decreased activity tolerance, Decreased endurance, Decreased range of motion, Decreased mobility, Decreased strength, Impaired flexibility, Impaired UE functional use, Postural dysfunction, Pain  Visit Diagnosis: Chronic right shoulder pain  Abnormal posture  Muscle weakness (generalized)     Problem List Patient Active Problem List    Diagnosis Date Noted   Influenza vaccine refused 06/13/2020   Dermatitis 06/13/2020   Mixed hyperlipidemia 06/13/2020   Prediabetes 11/21/2012   Breast fibrocystic disorder 11/21/2012   Chest pain 11/21/2012   Anxiety state, unspecified 11/21/2012   Incidental lung nodule, > 51mm and < 49mm 02/15/2012   Gar Ponto MS, PT 03/15/21 6:07 AM   [facility] [address] [place], [place], [address] Phone: [phone]   Fax:  [phone]  Name: Cailey Trigueros MRN: [id number] Date of Birth: [birth year]

## 2021-03-16 ENCOUNTER — Ambulatory Visit: Payer: Medicare HMO | Admitting: Internal Medicine

## 2021-03-18 ENCOUNTER — Ambulatory Visit: Payer: Medicare HMO | Admitting: Rehabilitative and Restorative Service Providers"

## 2021-03-21 ENCOUNTER — Ambulatory Visit: Payer: Medicare HMO

## 2021-03-22 ENCOUNTER — Ambulatory Visit
Admission: RE | Admit: 2021-03-22 | Discharge: 2021-03-22 | Disposition: A | Discharge status: Home / Self Care | Payer: Medicare HMO | Source: Ambulatory Visit | Attending: Internal Medicine | Admitting: Internal Medicine

## 2021-03-22 ENCOUNTER — Other Ambulatory Visit: Payer: Self-pay

## 2021-03-22 DIAGNOSIS — Z780 Asymptomatic menopausal state: Secondary | ICD-10-CM | POA: Diagnosis not present

## 2021-03-24 ENCOUNTER — Telehealth: Payer: Self-pay

## 2021-03-24 NOTE — Telephone Encounter (Signed)
Contacted pt to go over bone density results pt is aware and doesn't have any questions or concerns

## 2021-03-28 ENCOUNTER — Other Ambulatory Visit: Payer: Self-pay

## 2021-03-28 ENCOUNTER — Ambulatory Visit: Payer: Medicare HMO

## 2021-03-28 DIAGNOSIS — M6281 Muscle weakness (generalized): Secondary | ICD-10-CM

## 2021-03-28 DIAGNOSIS — M25511 Pain in right shoulder: Secondary | ICD-10-CM | POA: Diagnosis not present

## 2021-03-28 DIAGNOSIS — R293 Abnormal posture: Secondary | ICD-10-CM

## 2021-03-28 DIAGNOSIS — G8929 Other chronic pain: Secondary | ICD-10-CM

## 2021-03-28 NOTE — Therapy (Signed)
[place], [place], [address] Phone: [phone]   Fax:  [phone]  Physical Therapy Treatment  Patient Details  Name: Emily [name] MRN: [id number] Date of Birth: [birth year] Referring Provider (PT): Dr. Karle Plumber, MD   Encounter Date: 03/28/2021   PT End of Session - 03/28/21 0933     Visit Number 4    Number of Visits 12    Date for PT Re-Evaluation 04/08/21    Authorization Type [employer] MCR    Progress Note Due on Visit 10    PT Start Time 0932    PT Stop Time 1014    PT Time Calculation (min) 42 min    Activity Tolerance Patient tolerated treatment well    Behavior During Therapy [facility] for tasks assessed/performed             Past Medical History:  Diagnosis Date   Anemia    iron runs low   Anxiety    Anxiety state, unspecified 11/21/2012   Breast fibrocystic disorder 11/21/2012   Bronchitis    Cataract    Chest pain 11/21/2012   Normal stress echocardiogram 11/2010    History of chicken pox    History of uterine fibroid    Incidental lung nodule, > 49mm and < 69mm 02/15/2012   58mm RUL subpleural nodule found on CXR screening for TB due to h/o TB exposure as a child with positive ppd.  Recheck CT scan in 6-12 mos (March - Sept 2014) Repeat CT scan 01/2013 shows: Stable subpleural nodule in the right upper lobe measuring 5 mm. If this patient is at low risk for lung cancer then no further workup is necessary.  If this patient is at increased risk for lung cancer then foll   Nontraumatic tear of skin    right side of vagina skin since 06-12-15, small amount of blood when wipes   Prediabetes 11/21/2012   Tuberculosis    exposed as a young child    Past Surgical History:  Procedure Laterality Date   BREAST BIOPSY Right 05/28/2016   DENSE STROMAL FIBROSIS    COLONOSCOPY WITH PROPOFOL N/A 04/24/2016   Procedure: COLONOSCOPY WITH PROPOFOL;  Surgeon: Garlan Fair, MD;  Location: [facility]  ENDOSCOPY;  Service: Endoscopy;  Laterality: N/A;   EYE SURGERY Bilateral    lens replacements   FRACTURE SURGERY Right 2003   fibula   surgery for fibroids  2000    There were no vitals filed for this visit.   Subjective Assessment - 03/28/21 0940     Subjective Pt reports she is doing better with her R shoulder only having 1 period of increase pain yesterday when sitting with cold air hitting it. Overall 75% better. Sleeping with less pain, less interupted sleep.    Patient Stated Goals to use my arm without pain    Currently in Pain? Yes    Pain Score 0-No pain    Pain Location Shoulder    Pain Orientation Right    Pain Descriptors / Indicators Throbbing    Pain Onset More than a month ago    Pain Frequency Intermittent    Aggravating Factors  Cold air, liffting too much weight                               [facility] Adult PT Treatment/Exercise - 03/29/21 0001       Exercises   Exercises  Shoulder;Elbow      Shoulder Exercises: Supine   Other Supine Exercises supine wand flexion AAROM 2x5 reps      Shoulder Exercises: Sidelying   External Rotation Right;10 reps   2 sets   External Rotation Weight (lbs) 2    ABduction Limitations Right scaption to 90 deg  2x10 reps      Shoulder Exercises: Standing   Flexion Right;10 reps   wall slide, 2 sets   Row Right;10 reps   2 sets     Shoulder Exercises: Isometric Strengthening   External Rotation Limitations 3x30 sec    Other Isometric Exercises R bicep isometric, 3x30 sec      Shoulder Exercises: Stretch   Other Shoulder Stretches Upper trap and levator scap. stretches 20 sec, x2 each    Other Shoulder Stretches R UE, 90d, pec stretch,2x20sec      Manual Therapy   Manual Therapy Soft tissue mobilization    Soft tissue mobilization Cross friction massage to the R biceptal tendon as tolerated                     PT Education - 03/29/21 0622     Education Details proviced education for  proper technique for pt's HEP and cross friction massage    Person(s) Educated Patient    Methods Explanation;Demonstration;Tactile cues;Verbal cues    Comprehension Verbalized understanding;Returned demonstration;Verbal cues required;Tactile cues required;Need further instruction                 PT Long Term Goals - 02/25/21 0947       PT LONG TERM GOAL #1   Title Pt will be indep with HEP in prep for discharge for R shoulder pain management    Baseline issued at eval    Time 6    Period Weeks    Status New    Target Date 04/08/21      PT LONG TERM GOAL #2   Title Pt will be able to carry grocery bags with </=3/10 R shoulder pain    Baseline unable without pain >/=5/10    Time 6    Period Weeks    Status New    Target Date 04/08/21      PT LONG TERM GOAL #3   Title Pt will be able to lay in L sidelying without R shoulder pain awakening her x 1 hour    Baseline unable    Time 6    Period Weeks    Status New    Target Date 04/08/21      PT LONG TERM GOAL #4   Title Pt will be able to perform yardwork x 1 hour without R shoulder pain    Baseline 15 min    Time 6    Period Weeks    Status New    Target Date 04/08/21      PT LONG TERM GOAL #5   Title Pt will improve foto score to 63% function to assist with iADLs    Baseline 45%    Time 6    Period Weeks    Status New    Target Date 04/08/21      Additional Long Term Goals   Additional Long Term Goals Yes      PT LONG TERM GOAL #6   Title Pt will demo improved R shoulder strength >/=1 MMT grade to assist with functional activities    Baseline R shoulder flex 3-/5, abdct 3/5, IR 4+/5, ER  3-/5, elbow flex/ext 3/5.    Time 6    Period Weeks    Status New    Target Date 04/08/21                   Plan - 03/28/21 0934     Clinical Impression Statement Pt reports good progress regarding her R shoulder pain. PT was provided for CFM to the bicepital tendon f/b ther ex for flexibility, strengthening  and tissue remodeling. Verval and tactile cueing were provided to assist pt in the completion of her ther ex which are part of her HEP. Pt is making appropriate progress re: R shoulder pain and use.    Personal Factors and Comorbidities Other;Comorbidity 1;Comorbidity 2    Comorbidities fibrotic R breast, allergic to latex    Examination-Activity Limitations Lift;Caring for Others;Reach Overhead;Carry;Sleep    Examination-Participation Restrictions Laundry;Shop;Cleaning;Community Activity;Driving    Stability/Clinical Decision Making Evolving/Moderate complexity    Clinical Decision Making Moderate    Rehab Potential Good    PT Frequency 2x / week    PT Duration 6 weeks    PT Treatment/Interventions Cryotherapy;Electrical Stimulation;Iontophoresis 4mg /ml Dexamethasone;Moist Heat;Ultrasound;Functional mobility training;Therapeutic activities;Therapeutic exercise;Patient/family education;Manual techniques;Taping;Dry needling;Passive range of motion;Vasopneumatic Device    PT Next Visit Plan review HEP updates as needed, continue shoulder ROM/strengthening and isometrics vs. progress to eccentric for bicep/shoulder as tolerated, continue manual as found beneficial, had discussed potential dry needling but pt. wishes to hold for now so would plan STM for myofascial component of symptoms/further inclusion of manual therapy prn for pain. Review sleeping positions and cross friction massage. Continue education with HEP.    PT Home Exercise Plan Access Code: [email or web address]    Consulted and Agree with Plan of Care Patient             Patient will benefit from skilled therapeutic intervention in order to improve the following deficits and impairments:  Decreased activity tolerance, Decreased endurance, Decreased range of motion, Decreased mobility, Decreased strength, Impaired flexibility, Impaired UE functional use, Postural dysfunction, Pain  Visit Diagnosis: Chronic right shoulder pain  Abnormal  posture  Muscle weakness (generalized)     Problem List Patient Active Problem List   Diagnosis Date Noted   Influenza vaccine refused 06/13/2020   Dermatitis 06/13/2020   Mixed hyperlipidemia 06/13/2020   Prediabetes 11/21/2012   Breast fibrocystic disorder 11/21/2012   Chest pain 11/21/2012   Anxiety state, unspecified 11/21/2012   Incidental lung nodule, > 26mm and < 110mm 02/15/2012    Gar Ponto MS, PT 03/29/21 6:33 AM   [facility] [address] [place], [place], [address] Phone: [phone]   Fax:  [phone]  Name: Emily [name] MRN: [id number] Date of Birth: 1950/07/03

## 2021-04-01 ENCOUNTER — Ambulatory Visit: Payer: Medicare HMO | Admitting: Rehabilitative and Restorative Service Providers"

## 2021-04-01 ENCOUNTER — Other Ambulatory Visit: Payer: Self-pay

## 2021-04-01 ENCOUNTER — Encounter: Payer: Self-pay | Admitting: Rehabilitative and Restorative Service Providers"

## 2021-04-01 DIAGNOSIS — G8929 Other chronic pain: Secondary | ICD-10-CM

## 2021-04-01 DIAGNOSIS — R293 Abnormal posture: Secondary | ICD-10-CM

## 2021-04-01 DIAGNOSIS — M6281 Muscle weakness (generalized): Secondary | ICD-10-CM

## 2021-04-01 DIAGNOSIS — M25511 Pain in right shoulder: Secondary | ICD-10-CM | POA: Diagnosis not present

## 2021-04-01 NOTE — Therapy (Signed)
[place], [place], [address] Phone: [phone]   Fax:  [phone]  Physical Therapy Treatment/Re-eval  Patient Details  Name: Emily [name] MRN: [id number] Date of Birth: Mar 02, 1951 Referring Provider (PT): Dr. Karle Plumber, MD   Encounter Date: 04/01/2021   PT End of Session - 04/01/21 0842     Visit Number 5    Number of Visits 12    Date for PT Re-Evaluation 04/29/21    Authorization Type [employer] MCR    Progress Note Due on Visit 10    PT Start Time 0815    PT Stop Time 9767    PT Time Calculation (min) 44 min    Activity Tolerance Patient tolerated treatment well;No increased pain    Behavior During Therapy [facility] for tasks assessed/performed             Past Medical History:  Diagnosis Date   Anemia    iron runs low   Anxiety    Anxiety state, unspecified 11/21/2012   Breast fibrocystic disorder 11/21/2012   Bronchitis    Cataract    Chest pain 11/21/2012   Normal stress echocardiogram 11/2010    History of chicken pox    History of uterine fibroid    Incidental lung nodule, > 61mm and < 76mm 02/15/2012   77mm RUL subpleural nodule found on CXR screening for TB due to h/o TB exposure as a child with positive ppd.  Recheck CT scan in 6-12 mos (March - Sept 2014) Repeat CT scan 01/2013 shows: Stable subpleural nodule in the right upper lobe measuring 5 mm. If this patient is at low risk for lung cancer then no further workup is necessary.  If this patient is at increased risk for lung cancer then foll   Nontraumatic tear of skin    right side of vagina skin since 06-12-15, small amount of blood when wipes   Prediabetes 11/21/2012   Tuberculosis    exposed as a young child    Past Surgical History:  Procedure Laterality Date   BREAST BIOPSY Right 05/28/2016   DENSE STROMAL FIBROSIS    COLONOSCOPY WITH PROPOFOL N/A 04/24/2016   Procedure: COLONOSCOPY WITH PROPOFOL;  Surgeon: Garlan Fair, MD;  Location: [facility] ENDOSCOPY;  Service: Endoscopy;  Laterality: N/A;   EYE SURGERY Bilateral    lens replacements   FRACTURE SURGERY Right 2003   fibula   surgery for fibroids  2000    There were no vitals filed for this visit.   Subjective Assessment - 04/01/21 0816     Subjective I am doing great. No pain this week. Only have discomfort slightly at night but not last night.    Currently in Pain? No/denies    Pain Score 0-No pain                               [facility] Adult PT Treatment/Exercise - 04/01/21 0001       Elbow Exercises   Other elbow exercises elbow iso R 15 with 2-3 sec hold; standing 2 lb bicep curls R x 15      Shoulder Exercises: Supine   Other Supine Exercises supine wand chest press x 20; supine wand shoulder flex x 20, supine wand R shoulder ER x 20    Other Supine Exercises supine R shoulder ext iso x 15 with 2-3 sec hold      Shoulder Exercises:  Seated   Other Seated Exercises UBE level 1 x 2.5 min ant, 2.5 min posterior while discussing sleeping positions when supine      Shoulder Exercises: Sidelying   Other Sidelying Exercises L sidelying R shoulder ER x 15 without weight due to 2 lb weight with slight discomfort      Shoulder Exercises: Standing   Other Standing Exercises GTB row x 15, GTB shoulder ext x 12 with max PT verbal and visual cues for technique, wall pushups x 20; no weight R front raise x 15, no weight lateral raise x 15; all to R UE    Other Standing Exercises doorway stretch bil UEs 2x15 sec      Shoulder Exercises: Isometric Strengthening   Other Isometric Exercises R biceps iso hold x 10 with 2-3 sec hold      Manual Therapy   Soft tissue mobilization cross friction massage to R bicep tendon                     PT Education - 04/01/21 0841     Education Details discussed with pt instead of sleeping with both arms behind her head when supine to sleep with them on her stomach. she is going to  try it; reviewed CFM    Person(s) Educated Patient    Methods Explanation    Comprehension Verbalized understanding;Returned demonstration                 PT Long Term Goals - 04/01/21 0845       PT LONG TERM GOAL #1   Title Pt will be indep with HEP in prep for discharge for R shoulder pain management    Time 4    Status On-going    Target Date 04/29/21      PT LONG TERM GOAL #2   Title Pt will be able to carry grocery bags with </=3/10 R shoulder pain; 2/10 pain reported    Time 6    Status Achieved      PT LONG TERM GOAL #3   Title Pt will be able to lay in L sidelying without R shoulder pain awakening her x 1 hour    Time 4    Status On-going    Target Date 04/29/21      PT LONG TERM GOAL #4   Title Pt will be able to perform yardwork x 1 hour without R shoulder pain    Time 6    Status Achieved      PT LONG TERM GOAL #5   Title Pt will improve foto score to 63% function to assist with iADLs    Time 4    Status On-going    Target Date 04/29/21      PT LONG TERM GOAL #6   Title Pt will demo improved R shoulder strength >/=1 MMT grade to assist with functional activities    Time 4    Status On-going    Target Date 04/29/21                   Plan - 04/01/21 0842     Clinical Impression Statement Pt reports good progress regarding her R shoulder pain.  Pt is making appropriate progress re: R shoulder pain and use. continue R UE strengthening and flexibility to increase usage functionally without pain.    Rehab Potential Good    PT Frequency 2x / week    PT Duration 4 weeks  PT Treatment/Interventions Cryotherapy;Electrical Stimulation;Iontophoresis 4mg /ml Dexamethasone;Moist Heat;Ultrasound;Functional mobility training;Therapeutic activities;Therapeutic exercise;Patient/family education;Manual techniques;Taping;Dry needling;Passive range of motion;Vasopneumatic Device    PT Next Visit Plan continue shoulder ROM/strengthening and isometrics vs.  progress to eccentric for bicep/shoulder as tolerated R UE, continue manual as found beneficial, had discussed potential dry needling but pt. wishes to hold for now so would plan STM for myofascial component of symptoms/further inclusion of manual therapy prn for pain. Continue education with HEP.    Consulted and Agree with Plan of Care Patient             Patient will benefit from skilled therapeutic intervention in order to improve the following deficits and impairments:  Decreased activity tolerance, Decreased endurance, Decreased range of motion, Decreased mobility, Decreased strength, Impaired flexibility, Impaired UE functional use, Postural dysfunction, Pain  Visit Diagnosis: Chronic right shoulder pain  Abnormal posture  Muscle weakness (generalized)     Problem List Patient Active Problem List   Diagnosis Date Noted   Influenza vaccine refused 06/13/2020   Dermatitis 06/13/2020   Mixed hyperlipidemia 06/13/2020   Prediabetes 11/21/2012   Breast fibrocystic disorder 11/21/2012   Chest pain 11/21/2012   Anxiety state, unspecified 11/21/2012   Incidental lung nodule, > 55mm and < 42mm 02/15/2012    America Brown, PT 04/01/2021, 9:58 AM  [facility] [address] [place], [place], [address] Phone: [phone]   Fax:  [phone]  Name: Emily [name] MRN: [id number] Date of Birth: Dec 01, 1950

## 2021-04-05 ENCOUNTER — Other Ambulatory Visit: Payer: Self-pay

## 2021-04-05 ENCOUNTER — Encounter: Payer: Self-pay | Admitting: Rehabilitative and Restorative Service Providers"

## 2021-04-05 ENCOUNTER — Ambulatory Visit: Payer: Medicare HMO | Admitting: Rehabilitative and Restorative Service Providers"

## 2021-04-05 DIAGNOSIS — R293 Abnormal posture: Secondary | ICD-10-CM | POA: Diagnosis not present

## 2021-04-05 DIAGNOSIS — G8929 Other chronic pain: Secondary | ICD-10-CM | POA: Diagnosis not present

## 2021-04-05 DIAGNOSIS — M6281 Muscle weakness (generalized): Secondary | ICD-10-CM | POA: Diagnosis not present

## 2021-04-05 DIAGNOSIS — M25511 Pain in right shoulder: Secondary | ICD-10-CM | POA: Diagnosis not present

## 2021-04-05 NOTE — Therapy (Signed)
[place], [place], [address] Phone: [phone]   Fax:  [phone]  Physical Therapy Treatment  Patient Details  Name: Emily [name] MRN: [id number] Date of Birth: [birth year] Referring Provider (PT): Dr. Karle Plumber, MD   Encounter Date: 04/05/2021   PT End of Session - 04/05/21 0951     Visit Number 6    Number of Visits 12    Date for PT Re-Evaluation 04/29/21    Authorization Type [employer] MCR    Progress Note Due on Visit 10    PT Start Time 0929    PT Stop Time 1013    PT Time Calculation (min) 44 min    Activity Tolerance Patient tolerated treatment well;No increased pain    Behavior During Therapy WFL for tasks assessed/performed             Past Medical History:  Diagnosis Date   Anemia    iron runs low   Anxiety    Anxiety state, unspecified 11/21/2012   Breast fibrocystic disorder 11/21/2012   Bronchitis    Cataract    Chest pain 11/21/2012   Normal stress echocardiogram 11/2010    History of chicken pox    History of uterine fibroid    Incidental lung nodule, > 7mm and < 54mm 02/15/2012   8mm RUL subpleural nodule found on CXR screening for TB due to h/o TB exposure as a child with positive ppd.  Recheck CT scan in 6-12 mos (March - Sept 2014) Repeat CT scan 01/2013 shows: Stable subpleural nodule in the right upper lobe measuring 5 mm. If this patient is at low risk for lung cancer then no further workup is necessary.  If this patient is at increased risk for lung cancer then foll   Nontraumatic tear of skin    right side of vagina skin since 06-12-15, small amount of blood when wipes   Prediabetes 11/21/2012   Tuberculosis    exposed as a young child    Past Surgical History:  Procedure Laterality Date   BREAST BIOPSY Right 05/28/2016   DENSE STROMAL FIBROSIS    COLONOSCOPY WITH PROPOFOL N/A 04/24/2016   Procedure: COLONOSCOPY WITH PROPOFOL;  Surgeon: Garlan Fair, MD;   Location: [facility];  Service: Endoscopy;  Laterality: N/A;   EYE SURGERY Bilateral    lens replacements   FRACTURE SURGERY Right 2003   fibula   surgery for fibroids  2000    There were no vitals filed for this visit.   Subjective Assessment - 04/05/21 0926     Subjective No pain.    Currently in Pain? No/denies    Pain Score 0-No pain                               [facility] Adult PT Treatment/Exercise - 04/05/21 0001       Shoulder Exercises: Supine   Other Supine Exercises 2 lb shoulder punch R x 20; shoulder row iso x 20 with 2-3 sec hold R; R shoulder ext iso x 2-3 sec x 20; RTB chest pull x 20; RTB hip pull x 20; scapular depression R x 20    Other Supine Exercises 2 lb R scapular protraction/retraction x 15, rhythmic stabilization up/down, in/out x 20 each;      Shoulder Exercises: Seated   Other Seated Exercises UBE level 1 x 3.5 min, x 2.5 min posterior with PT discussing  pain.    Other Seated Exercises Rhomboid stretch 2x30 sec each side      Shoulder Exercises: Standing   Other Standing Exercises wall pushups x 20, wall angels x 15, horiz abdct from against wall x 15 to stretch chest performed unilat bil. doorway chest stretch 2x30 sec. all performed bil for posture.    Other Standing Exercises GTB row x 20, GTB ER x 15. all performed bil for posture; followup with doorway chest stretch 2x30 sec after strengthening exercises again.                          PT Long Term Goals - 04/05/21 0953       PT LONG TERM GOAL #1   Title Pt will be indep with HEP in prep for discharge for R shoulder pain management    Status On-going      PT LONG TERM GOAL #2   Title Pt will be able to carry grocery bags with </=3/10 R shoulder pain; 2/10 pain reported    Status Achieved      PT LONG TERM GOAL #3   Title Pt will be able to lay in L sidelying without R shoulder pain awakening her x 1 hour    Status On-going      PT LONG TERM GOAL #4    Title Pt will be able to perform yardwork x 1 hour without R shoulder pain    Status Achieved      PT LONG TERM GOAL #5   Title Pt will improve foto score to 63% function to assist with iADLs    Status On-going                   Plan - 04/05/21 0951     Clinical Impression Statement Pt continues to report good progress regarding her R shoulder pain. Pt is making appropriate progress re: R shoulder pain and use. continue R UE strengthening and flexibility to increase usage functionally without pain. Pt is able to perform more activity with therapy without pain.    Rehab Potential Good    PT Frequency 2x / week    PT Duration 4 weeks    PT Treatment/Interventions Cryotherapy;Electrical Stimulation;Iontophoresis 4mg /ml Dexamethasone;Moist Heat;Ultrasound;Functional mobility training;Therapeutic activities;Therapeutic exercise;Patient/family education;Manual techniques;Taping;Dry needling;Passive range of motion;Vasopneumatic Device    PT Next Visit Plan continue shoulder ROM/strengthening and isometrics vs. progress to eccentric for bicep/shoulder as tolerated R UE, posture therex    Consulted and Agree with Plan of Care Patient             Patient will benefit from skilled therapeutic intervention in order to improve the following deficits and impairments:  Decreased activity tolerance, Decreased endurance, Decreased range of motion, Decreased mobility, Decreased strength, Impaired flexibility, Impaired UE functional use, Postural dysfunction, Pain  Visit Diagnosis: Chronic right shoulder pain  Abnormal posture  Muscle weakness (generalized)     Problem List Patient Active Problem List   Diagnosis Date Noted   Influenza vaccine refused 06/13/2020   Dermatitis 06/13/2020   Mixed hyperlipidemia 06/13/2020   Prediabetes 11/21/2012   Breast fibrocystic disorder 11/21/2012   Chest pain 11/21/2012   Anxiety state, unspecified 11/21/2012   Incidental lung nodule, > 26mm  and < 41mm 02/15/2012    America Brown, PT 04/05/2021, 10:16 AM  [facility] [address] [place], [place], [address] Phone: [phone]   Fax:  [phone]  Name: Emily [name]  MRN: [id number] Date of Birth: 19-Jul-1950

## 2021-04-05 NOTE — Patient Instructions (Signed)
Advised pt she may be sore tomorrow and to use ice/heat 10-15 min as needed and not do exercises and then resume again on Friday. Also advised to functionally move arm tomorrow if muscular soreness to help rid of soreness.

## 2021-04-12 ENCOUNTER — Ambulatory Visit: Payer: Self-pay | Admitting: *Deleted

## 2021-04-12 NOTE — Telephone Encounter (Signed)
Pt reports right knee swollen since October. Denies pain, knee only with swelling, no redness or warmth. States elevates and ices when starts to swell during day, goes down. Pt requesting referral for P.T.. States helped her tremendously with her shoulder in the past. Care advise given, pt verbalizes understanding.  Please advise:  [phone]     Reason for Disposition  Swollen knee joint (Exception: area of localized swelling which is itchy)  Answer Assessment - Initial Assessment Questions 1. LOCATION: "Where is the swelling located?"  (e.g., left, right, both knees)     Right knee 2. SIZE and DESCRIPTION: "What does the swelling look like?"  (e.g., entire knee, localized)     Knee only, twice the size at times 3. ONSET: "When did the swelling start?" "Does it come and go, or is it there all the time?"     October 4. PAIN: "Is there any pain?" If Yes, ask: "How bad is it?" (Scale 1-10; or mild, moderate, severe)     Not much 5. SETTING: "Has there been any recent work, exercise or other activity that involved that part of the body?"      no 6. AGGRAVATING FACTORS: "What makes the knee swelling worse?" (e.g., walking, climbing stairs, running)     "Being up and about" 7. ASSOCIATED SYMPTOMS: "Is there any pain or redness?"     NO 8. OTHER SYMPTOMS: "Do you have any other symptoms?" (e.g., chest pain, difficulty breathing, fever, calf pain)     No  Protocols used: Knee Swelling-A-AH

## 2021-04-14 ENCOUNTER — Ambulatory Visit: Payer: Medicare HMO

## 2021-04-18 NOTE — Telephone Encounter (Signed)
Contacted pt to go over provider response pt didn't answer lvm  

## 2021-04-20 ENCOUNTER — Other Ambulatory Visit: Payer: Self-pay

## 2021-04-20 ENCOUNTER — Ambulatory Visit: Payer: Medicare HMO | Attending: Internal Medicine

## 2021-04-20 DIAGNOSIS — R293 Abnormal posture: Secondary | ICD-10-CM | POA: Diagnosis not present

## 2021-04-20 DIAGNOSIS — M25511 Pain in right shoulder: Secondary | ICD-10-CM | POA: Diagnosis not present

## 2021-04-20 DIAGNOSIS — G8929 Other chronic pain: Secondary | ICD-10-CM

## 2021-04-20 DIAGNOSIS — M6281 Muscle weakness (generalized): Secondary | ICD-10-CM | POA: Diagnosis not present

## 2021-04-20 NOTE — Therapy (Signed)
[place], [place], [address] Phone: [phone]   Fax:  [phone]  Physical Therapy Treatment  Patient Details  Name: Emily [name] MRN: [id number] Date of Birth: [birth year] Referring Provider (PT): Dr. Karle Plumber, MD   Encounter Date: 04/20/2021   PT End of Session - 04/20/21 0848     Visit Number 7    Number of Visits 12    Date for PT Re-Evaluation 04/29/21    Authorization Type [employer]et[employer] MCR    PT Start Time 8110    PT Stop Time 0930    PT Time Calculation (min) 43 min    Activity Tolerance Patient tolerated treatment well;No increased pain    Behavior During Therapy WFL for tasks assessed/performed             Past Medical History:  Diagnosis Date   Anemia    iron runs low   Anxiety    Anxiety state, unspecified 11/21/2012   Breast fibrocystic disorder 11/21/2012   Bronchitis    Cataract    Chest pain 11/21/2012   Normal stress echocardiogram 11/2010    History of chicken pox    History of uterine fibroid    Incidental lung nodule, > 61m and < 836m10/08/2011   68m104mUL subpleural nodule found on CXR screening for TB due to h/o TB exposure as a child with positive ppd.  Recheck CT scan in 6-12 mos (March - Sept 2014) Repeat CT scan 01/2013 shows: Stable subpleural nodule in the right upper lobe measuring 5 mm. If this patient is at low risk for lung cancer then no further workup is necessary.  If this patient is at increased risk for lung cancer then foll   Nontraumatic tear of skin    right side of vagina skin since 06-12-15, small amount of blood when wipes   Prediabetes 11/21/2012   Tuberculosis    exposed as a young child    Past Surgical History:  Procedure Laterality Date   BREAST BIOPSY Right 05/28/2016   DENSE STROMAL FIBROSIS    COLONOSCOPY WITH PROPOFOL N/A 04/24/2016   Procedure: COLONOSCOPY WITH PROPOFOL;  Surgeon: MarGarlan FairD;  Location: [facility];  Service:  Endoscopy;  Laterality: N/A;   EYE SURGERY Bilateral    lens replacements   FRACTURE SURGERY Right 2003   fibula   surgery for fibroids  2000    There were no vitals filed for this visit.   Subjective Assessment - 04/20/21 0851     Subjective Pt reports not feeling well this past week and has not been as consistent with her HEP. The majority of the time no pain, but has occasional pain c certain movements.    Diagnostic tests Xray with negative findings    Patient Stated Goals to use my arm without pain    Currently in Pain? No/denies    Pain Location Shoulder    Pain Orientation Right                               [facility] Adult PT Treatment/Exercise - 04/20/21 0001       Shoulder Exercises: Supine   Protraction Both;20 reps    Protraction Weight (lbs) 2    Horizontal ABduction Both;20 reps    Theraband Level (Shoulder Horizontal ABduction) Level 2 (Red)    External Rotation Both;20 reps    Theraband Level (Shoulder External Rotation) Level  2 (Red)      Shoulder Exercises: Standing   Protraction Both;20 reps    Theraband Level (Shoulder Protraction) Level 2 (Red)    Extension Both;15 reps    Theraband Level (Shoulder Extension) Level 2 (Red)    Row Both;15 reps    Theraband Level (Shoulder Row) Level 2 (Red)      Shoulder Exercises: ROM/Strengthening   UBE (Upper Arm Bike) L1; 2 mins both forward and backwards      Shoulder Exercises: Stretch   Other Shoulder Stretches R pectoral doorway stretch 3x 30"                          PT Long Term Goals - 04/20/21 0904       PT LONG TERM GOAL #3   Title Pt will be able to lay in L sidelying without R shoulder pain awakening her x 1 hour. 04/20/21: pt reports her R shoulder is no longer waking her up at night    Status Achieved    Target Date 04/20/21      PT LONG TERM GOAL #5   Title Pt will improve foto score to 63% function to assist with iADLs. 04/20/21: 65% functional ability     Baseline 45%    Status Achieved    Target Date 04/20/21      PT LONG TERM GOAL #6   Title Pt will demo improved R shoulder strength >/=1 MMT grade to assist with functional activities. 04/20/21: Sh ER, flex and abd=4+/5, elbow flex , sh IR and add= 5/5    Baseline R shoulder flex 3-/5, abdct 3/5, IR 4+/5, ER 3-/5, elbow flex/ext 3/5.    Status Achieved    Target Date 04/20/21                   Plan - 04/20/21 0849     Clinical Impression Statement Pt is making good progress with R shoulder pain, ROM, strength and function. Pt has met goals for R shoulder strength and her functional ability score per FOTO. PT was completed for RTC and periscapular strengthening. Pt tolerated today's session without adverse effects. Will develop final HEP with probable DC from OPPT at that time if pt is continuing to do well.    Personal Factors and Comorbidities Other;Comorbidity 1;Comorbidity 2    Comorbidities fibrotic R breast, allergic to latex    Examination-Activity Limitations Lift;Caring for Others;Reach Overhead;Carry;Sleep    Examination-Participation Restrictions Laundry;Shop;Cleaning;Community Activity;Driving    Stability/Clinical Decision Making Evolving/Moderate complexity    Clinical Decision Making Moderate    Rehab Potential Good    PT Frequency 2x / week    PT Duration 4 weeks    PT Treatment/Interventions Cryotherapy;Electrical Stimulation;Iontophoresis 41m/ml Dexamethasone;Moist Heat;Ultrasound;Functional mobility training;Therapeutic activities;Therapeutic exercise;Patient/family education;Manual techniques;Taping;Dry needling;Passive range of motion;Vasopneumatic Device    PT Next Visit Plan continue shoulder ROM/strengthening and isometrics vs. progress to eccentric for bicep/shoulder as tolerated R UE, posture therex    PT Home Exercise Plan Access Code: [email or web address]   Consulted and Agree with Plan of Care Patient             Patient will benefit from skilled  therapeutic intervention in order to improve the following deficits and impairments:  Decreased activity tolerance, Decreased endurance, Decreased range of motion, Decreased mobility, Decreased strength, Impaired flexibility, Impaired UE functional use, Postural dysfunction, Pain  Visit Diagnosis: Chronic right shoulder pain  Abnormal posture  Muscle weakness (generalized)  Problem List Patient Active Problem List   Diagnosis Date Noted   Influenza vaccine refused 06/13/2020   Dermatitis 06/13/2020   Mixed hyperlipidemia 06/13/2020   Prediabetes 11/21/2012   Breast fibrocystic disorder 11/21/2012   Chest pain 11/21/2012   Anxiety state, unspecified 11/21/2012   Incidental lung nodule, > 49m and < 86m10/08/2011    AlGar PontoS, PT 04/20/21 11:06 AM   [facility]ora[place]hone: [phone] Fax:  [phone]: Emily FANGUYRN: [id number] of Birth: 1209/28/1952

## 2021-04-21 ENCOUNTER — Telehealth: Payer: Self-pay | Admitting: Internal Medicine

## 2021-04-21 DIAGNOSIS — M25561 Pain in right knee: Secondary | ICD-10-CM

## 2021-04-21 NOTE — Telephone Encounter (Signed)
Pt was following up on seeing a doctor for her knee pain, wanted to know what Dr Wynetta Emery recommends.

## 2021-04-21 NOTE — Telephone Encounter (Signed)
Returned pt call pt didn't answer lvm  If pt calls back please give Dr. Wynetta Emery response in regards to previous message  Let patient know it sounds like she probably needs to see an orthopedic specialist for the knee first rather than physical therapy.  Let me know if she wants me to submit the referral for the orthopedics.

## 2021-04-21 NOTE — Telephone Encounter (Signed)
Copied from [place] [phone]. Topic: General - Other >> Apr 19, 2021  4:37 PM Celene Kras wrote: Reason for CRM: Pt calling to return call to nurse from NT notes on 04/12/21. Attempted to contact office x3 with no response. Please advise.

## 2021-04-21 NOTE — Telephone Encounter (Signed)
Referral Request - Has patient seen PCP for this complaint? yes *If NO, is insurance requiring patient see PCP for this issue before PCP can refer them? Referral for which specialty:orthopaedic Preferred provider/office: N/A Reason for referral:knee pain

## 2021-04-22 ENCOUNTER — Other Ambulatory Visit: Payer: Self-pay

## 2021-04-22 ENCOUNTER — Ambulatory Visit: Payer: Medicare HMO

## 2021-04-22 DIAGNOSIS — M25511 Pain in right shoulder: Secondary | ICD-10-CM | POA: Diagnosis not present

## 2021-04-22 DIAGNOSIS — M6281 Muscle weakness (generalized): Secondary | ICD-10-CM

## 2021-04-22 DIAGNOSIS — R293 Abnormal posture: Secondary | ICD-10-CM | POA: Diagnosis not present

## 2021-04-22 DIAGNOSIS — G8929 Other chronic pain: Secondary | ICD-10-CM | POA: Diagnosis not present

## 2021-04-23 NOTE — Therapy (Addendum)
[place], [place], [address] Phone: [phone]   Fax:  [phone]  Physical Therapy Treatment/Discharge  Patient Details  Name: Emily [name] MRN: [id number] Date of Birth: 22-Mar-1951 Referring Provider (PT): Dr. Karle Plumber, MD   Encounter Date: 04/22/2021   PT End of Session - 04/22/21 1044     Visit Number 8    Number of Visits 12    Date for PT Re-Evaluation 04/29/21    Authorization Type [employer]et[employer] MCR    PT Start Time 1038    PT Stop Time 1110    PT Time Calculation (min) 32 min    Activity Tolerance Patient tolerated treatment well;No increased pain    Behavior During Therapy WFL for tasks assessed/performed             Past Medical History:  Diagnosis Date   Anemia    iron runs low   Anxiety    Anxiety state, unspecified 11/21/2012   Breast fibrocystic disorder 11/21/2012   Bronchitis    Cataract    Chest pain 11/21/2012   Normal stress echocardiogram 11/2010    History of chicken pox    History of uterine fibroid    Incidental lung nodule, > 132m and < 818m10/08/2011   32m69mUL subpleural nodule found on CXR screening for TB due to h/o TB exposure as a child with positive ppd.  Recheck CT scan in 6-12 mos (March - Sept 2014) Repeat CT scan 01/2013 shows: Stable subpleural nodule in the right upper lobe measuring 5 mm. If this patient is at low risk for lung cancer then no further workup is necessary.  If this patient is at increased risk for lung cancer then foll   Nontraumatic tear of skin    right side of vagina skin since 06-12-15, small amount of blood when wipes   Prediabetes 11/21/2012   Tuberculosis    exposed as a young child    Past Surgical History:  Procedure Laterality Date   BREAST BIOPSY Right 05/28/2016   DENSE STROMAL FIBROSIS    COLONOSCOPY WITH PROPOFOL N/A 04/24/2016   Procedure: COLONOSCOPY WITH PROPOFOL;  Surgeon: MarGarlan FairD;  Location: [facility];   Service: Endoscopy;  Laterality: N/A;   EYE SURGERY Bilateral    lens replacements   FRACTURE SURGERY Right 2003   fibula   surgery for fibroids  2000    There were no vitals filed for this visit.   Subjective Assessment - 04/22/21 1043     Subjective Pt continues to report improve R shoulder function and decreased pain    Diagnostic tests Xray with negative findings    Patient Stated Goals to use my arm without pain    Currently in Pain? No/denies    Pain Location Shoulder    Pain Orientation Right                               [facility] Adult PT Treatment/Exercise - 04/23/21 0001       Shoulder Exercises: Standing   External Rotation Both;10 reps   2 sets   Extension Both;10 reps    Theraband Level (Shoulder Extension) Level 2 (Red)    Row Both;10 reps    Theraband Level (Shoulder Row) Level 2 (Red)      Shoulder Exercises: Isometric Strengthening   External Rotation --   3x30"   Other Isometric Exercises Bicep 3x 30"  Shoulder Exercises: Stretch   Other Shoulder Stretches R pectoral doorway, upper trap, and levator stretches 2x 30", each                     PT Education - 04/22/21 1114     Education Details Finalized HEP    Person(s) Educated Patient    Methods Explanation;Demonstration    Comprehension Verbalized understanding;Returned demonstration                 PT Long Term Goals - 04/22/21 1106       PT LONG TERM GOAL #1   Title Pt will be indep with HEP in prep for discharge for R shoulder pain management    Status Achieved    Target Date 04/22/21      PT LONG TERM GOAL #2   Title Pt will be able to carry grocery bags with </=3/10 R shoulder pain; 2/10 pain reported. 04/22/21: 0/10 c carrying grocery bags    Status Achieved    Target Date 04/22/21      PT LONG TERM GOAL #3   Title Pt will be able to lay in L sidelying without R shoulder pain awakening her x 1 hour. 04/20/21: pt reports her R shoulder is no  longer waking her up at night    Status Achieved    Target Date 04/20/21      PT LONG TERM GOAL #4   Title Pt will be able to perform yardwork x 1 hour without R shoulder pain    Status Achieved      PT LONG TERM GOAL #5   Title Pt will improve foto score to 63% function to assist with iADLs. 04/20/21: 65% functional ability    Baseline 45%    Status Achieved    Target Date 04/20/21      PT LONG TERM GOAL #6   Title Pt will demo improved R shoulder strength >/=1 MMT grade to assist with functional activities. 04/20/21: Sh ER, flex and abd=4+/5, elbow flex , sh IR and add= 5/5    Baseline R shoulder flex 3-/5, abdct 3/5, IR 4+/5, ER 3-/5, elbow flex/ext 3/5.    Status Achieved    Target Date 04/20/21                   Plan - 04/22/21 1044     Clinical Impression Statement Pt compelted her last PT session. Pt has made good progress re; function and pain of herR shoulder meeting all goals. Pt is Ind in a HEP to to maintain current LOF. Pt is in agreement with DC from PT services.    Personal Factors and Comorbidities Other;Comorbidity 1;Comorbidity 2    Comorbidities fibrotic R breast, allergic to latex    Examination-Activity Limitations Lift;Caring for Others;Reach Overhead;Carry;Sleep    Examination-Participation Restrictions Laundry;Shop;Cleaning;Community Activity;Driving    Stability/Clinical Decision Making Evolving/Moderate complexity    Clinical Decision Making Moderate    Rehab Potential Good    PT Frequency 2x / week    PT Duration 4 weeks    PT Treatment/Interventions Cryotherapy;Electrical Stimulation;Iontophoresis 17m/ml Dexamethasone;Moist Heat;Ultrasound;Functional mobility training;Therapeutic activities;Therapeutic exercise;Patient/family education;Manual techniques;Taping;Dry needling;Passive range of motion;Vasopneumatic Device    PT Next Visit Plan --    PT Home Exercise Plan Access Code: [email or web address]   Consulted and Agree with Plan of Care Patient              Patient will benefit from skilled therapeutic intervention in order to improve the  following deficits and impairments:  Decreased activity tolerance, Decreased endurance, Decreased range of motion, Decreased mobility, Decreased strength, Impaired flexibility, Impaired UE functional use, Postural dysfunction, Pain  Visit Diagnosis: Chronic right shoulder pain  Abnormal posture  Muscle weakness (generalized)     Problem List Patient Active Problem List   Diagnosis Date Noted   Influenza vaccine refused 06/13/2020   Dermatitis 06/13/2020   Mixed hyperlipidemia 06/13/2020   Prediabetes 11/21/2012   Breast fibrocystic disorder 11/21/2012   Chest pain 11/21/2012   Anxiety state, unspecified 11/21/2012   Incidental lung nodule, > 6m and < 847m10/08/2011   PHYSICAL THERAPY DISCHARGE SUMMARY  Visits from Start of Care: 8  Current functional level related to goals / functional outcomes: See above   Remaining deficits: See above   Education / Equipment: HEP, Use of cold packs and CFM to manage symptoms  Patient agrees to discharge. Patient goals were met. Patient is being discharged due to being pleased with the current functional level.    AlGar PontoS, PT 04/23/21 8:35 AM   [facility]hone: [phone] Fax:  [phone]: DiAUNISTY REALIRN: [id number] of Birth: [birth year]

## 2021-04-24 NOTE — Telephone Encounter (Signed)
Called pt left VM.

## 2021-04-26 ENCOUNTER — Ambulatory Visit (INDEPENDENT_AMBULATORY_CARE_PROVIDER_SITE_OTHER): Payer: Medicare HMO

## 2021-04-26 ENCOUNTER — Encounter (HOSPITAL_COMMUNITY): Payer: Self-pay | Admitting: Emergency Medicine

## 2021-04-26 ENCOUNTER — Ambulatory Visit (HOSPITAL_COMMUNITY)
Admission: EM | Admit: 2021-04-26 | Discharge: 2021-04-26 | Disposition: A | Discharge status: Home / Self Care | Payer: Medicare HMO | Attending: Physician Assistant | Admitting: Physician Assistant

## 2021-04-26 ENCOUNTER — Other Ambulatory Visit: Payer: Self-pay

## 2021-04-26 DIAGNOSIS — R059 Cough, unspecified: Secondary | ICD-10-CM

## 2021-04-26 DIAGNOSIS — J069 Acute upper respiratory infection, unspecified: Secondary | ICD-10-CM

## 2021-04-26 LAB — POC INFLUENZA A AND B ANTIGEN (URGENT CARE ONLY)
INFLUENZA A ANTIGEN, POC: NEGATIVE
INFLUENZA B ANTIGEN, POC: NEGATIVE

## 2021-04-26 NOTE — ED Provider Notes (Signed)
[facility]    CSN: [id number] Arrival date & time: 04/26/21  1401      History   Chief Complaint Chief Complaint  Patient presents with   Cough    HPI Emily [name] is a 70 y.o. female.   Pt complains of a cough and congestion.  Pt reports she had a negative home covid test that was negative.  Pt request a flu test.    The history is provided by the patient. No language interpreter was used.  Cough Cough characteristics:  Non-productive Sputum characteristics:  Nondescript Severity:  Moderate Onset quality:  Gradual Timing:  Constant Progression:  Worsening Chronicity:  New Smoker: no   Relieved by:  Nothing Worsened by:  Nothing Ineffective treatments:  None tried Associated symptoms: no fever   Risk factors: no recent infection    Past Medical History:  Diagnosis Date   Anemia    iron runs low   Anxiety    Anxiety state, unspecified 11/21/2012   Breast fibrocystic disorder 11/21/2012   Bronchitis    Cataract    Chest pain 11/21/2012   Normal stress echocardiogram 11/2010    History of chicken pox    History of uterine fibroid    Incidental lung nodule, > 44mm and < 100mm 02/15/2012   62mm RUL subpleural nodule found on CXR screening for TB due to h/o TB exposure as a child with positive ppd.  Recheck CT scan in 6-12 mos (March - Sept 2014) Repeat CT scan 01/2013 shows: Stable subpleural nodule in the right upper lobe measuring 5 mm. If this patient is at low risk for lung cancer then no further workup is necessary.  If this patient is at increased risk for lung cancer then foll   Nontraumatic tear of skin    right side of vagina skin since 06-12-15, small amount of blood when wipes   Prediabetes 11/21/2012   Tuberculosis    exposed as a young child    Patient Active Problem List   Diagnosis Date Noted   Influenza vaccine refused 06/13/2020   Dermatitis 06/13/2020   Mixed hyperlipidemia 06/13/2020   Prediabetes 11/21/2012   Breast fibrocystic  disorder 11/21/2012   Chest pain 11/21/2012   Anxiety state, unspecified 11/21/2012   Incidental lung nodule, > 57mm and < 11mm 02/15/2012    Past Surgical History:  Procedure Laterality Date   BREAST BIOPSY Right 05/28/2016   DENSE STROMAL FIBROSIS    COLONOSCOPY WITH PROPOFOL N/A 04/24/2016   Procedure: COLONOSCOPY WITH PROPOFOL;  Surgeon: Garlan Fair, MD;  Location: [facility];  Service: Endoscopy;  Laterality: N/A;   EYE SURGERY Bilateral    lens replacements   FRACTURE SURGERY Right 2003   fibula   surgery for fibroids  2000    OB History   No obstetric history on file.      Home Medications    Prior to Admission medications   Medication Sig Start Date End Date Taking? Authorizing Provider  Ascorbic Acid (VITAMIN C) 100 MG tablet Take 100 mg by mouth daily.    [provider]  ferrous sulfate 325 (65 FE) MG tablet Take 325 mg by mouth daily with breakfast.    [provider]  Multiple Vitamins-Minerals (EYE VITAMINS PO) Take by mouth.    [provider]    Family History Family History  Problem Relation Age of Onset   Diabetes Brother    Hypertension Brother    Alcohol abuse Brother  Mental illness Brother        nervous breakdown   Stroke Maternal Grandmother    Depression Paternal Grandmother    Arthritis Mother    COPD Father 45       decsd due to lack of oxygen   Hypertension Father    Alcohol abuse Father    Gout Father    Alcohol abuse Brother    Hypertension Brother     Social History Social History   Tobacco Use   Smoking status: Never   Smokeless tobacco: Never  Vaping Use   Vaping Use: Never used  Substance Use Topics   Alcohol use: No   Drug use: No     Allergies   Latex   Review of Systems Review of Systems  Constitutional:  Negative for fever.  Respiratory:  Positive for cough.   All other systems reviewed and are negative.   Physical Exam Triage Vital Signs ED Triage Vitals  Enc  Vitals Group     BP 04/26/21 1516 117/77     Pulse Rate 04/26/21 1516 87     Resp 04/26/21 1516 18     Temp 04/26/21 1516 98.5 F (36.9 C)     Temp Source 04/26/21 1516 Oral     SpO2 04/26/21 1516 95 %     Weight --      Height --      Head Circumference --      Peak Flow --      Pain Score 04/26/21 1515 0     Pain Loc --      Pain Edu? --      Excl. in [place]? --    No data found.  Updated Vital Signs BP 117/77 (BP Location: Left Arm)    Pulse 87    Temp 98.5 F (36.9 C) (Oral)    Resp 18    SpO2 95%   Visual Acuity Right Eye Distance:   Left Eye Distance:   Bilateral Distance:    Right Eye Near:   Left Eye Near:    Bilateral Near:     Physical Exam Vitals and nursing note reviewed.  Constitutional:      Appearance: She is well-developed.  HENT:     Head: Normocephalic.     Right Ear: Tympanic membrane normal.     Left Ear: Tympanic membrane normal.     Mouth/Throat:     Mouth: Mucous membranes are moist.  Cardiovascular:     Rate and Rhythm: Normal rate.  Pulmonary:     Effort: Pulmonary effort is normal.  Abdominal:     General: There is no distension.  Musculoskeletal:        General: Normal range of motion.     Cervical back: Normal range of motion.  Neurological:     General: No focal deficit present.     Mental Status: She is alert and oriented to person, place, and time.     UC Treatments / Results  Labs (all labs ordered are listed, but only abnormal results are displayed) Labs Reviewed  POC INFLUENZA A AND B ANTIGEN (URGENT CARE ONLY)    EKG   Radiology DG Chest 2 View  Result Date: 04/26/2021 CLINICAL DATA:  cough EXAM: CHEST - 2 VIEW COMPARISON:  August 05, 2012. FINDINGS: Mildly prominent interstitial markings, which are chronic. No consolidation. Subcentimeter nodular opacity in the right upper lobe is stable. Biapical pleuroparenchymal scarring. No visible pleural effusions or pneumothorax. Polyarticular degenerative change. IMPRESSION:  No evidence of acute cardiopulmonary disease. Electronically Signed   By: Margaretha Sheffield M.D.   On: 04/26/2021 17:02    Procedures Procedures (including critical care time)  Medications Ordered in UC Medications - No data to display  Initial Impression / Assessment and Plan / UC Course  I have reviewed the triage vital signs and the nursing notes.  Pertinent labs & imaging results that were available during my care of the patient were reviewed by me and considered in my medical decision making (see chart for details).     MDM:  covid negative  Final Clinical Impressions(s) / UC Diagnoses   Final diagnoses:  Viral URI with cough     Discharge Instructions      Your chest x-ray is negative for pneumonia Continue home remedies for cough Your flu test is negative Follow-up with primary care physician Maintain adequate hydration.   ED Prescriptions   None    PDMP not reviewed this encounter.   Fransico Meadow, [place] 04/26/21 1857

## 2021-04-26 NOTE — ED Triage Notes (Signed)
Pt reports Saturday been feeling bad. Coughing-mucous, fatigue, loss appetite. Had negative covid test yesterday.  Friend children had flu that was around.

## 2021-04-26 NOTE — Discharge Instructions (Addendum)
Your chest x-ray is negative for pneumonia Continue home remedies for cough Your flu test is negative Follow-up with primary care physician Maintain adequate hydration.

## 2021-04-27 ENCOUNTER — Ambulatory Visit (HOSPITAL_COMMUNITY)
Admission: EM | Admit: 2021-04-27 | Discharge: 2021-04-27 | Disposition: A | Discharge status: Home / Self Care | Payer: Medicare HMO | Attending: Internal Medicine | Admitting: Internal Medicine

## 2021-04-27 ENCOUNTER — Other Ambulatory Visit: Payer: Self-pay

## 2021-04-27 ENCOUNTER — Encounter (HOSPITAL_COMMUNITY): Payer: Self-pay

## 2021-04-27 ENCOUNTER — Telehealth: Payer: Self-pay | Admitting: Internal Medicine

## 2021-04-27 DIAGNOSIS — J209 Acute bronchitis, unspecified: Secondary | ICD-10-CM | POA: Diagnosis not present

## 2021-04-27 MED ORDER — PREDNISONE 20 MG PO TABS: 20.0000 mg | ORAL_TABLET | Freq: Every day | ORAL | 0 refills | Status: AC

## 2021-04-27 MED ORDER — BENZONATATE 100 MG PO CAPS: 100.0000 mg | ORAL_CAPSULE | Freq: Three times a day (TID) | ORAL | 0 refills | Status: DC | PRN

## 2021-04-27 NOTE — ED Provider Notes (Signed)
[facility]    CSN: [id number] Arrival date & time: 04/27/21  1751      History   Chief Complaint Chief Complaint  Patient presents with   Cough    HPI Emily [name] is a 70 y.o. female comes to urgent care with complaints of cough with bloody sputum this morning.  Patient was evaluated yesterday for viral bronchitis.  Chest x-ray was negative for lung infiltrate.  This morning patient had an episode of cough.  Cough was productive of sputum.  Patient noticed some streaks of blood on the sputum.  She experienced some chest tightness a few days ago but denies wheezing or chest pain.  No nausea, vomiting or diarrhea.  No fever or chills.   HPI  Past Medical History:  Diagnosis Date   Anemia    iron runs low   Anxiety    Anxiety state, unspecified 11/21/2012   Breast fibrocystic disorder 11/21/2012   Bronchitis    Cataract    Chest pain 11/21/2012   Normal stress echocardiogram 11/2010    History of chicken pox    History of uterine fibroid    Incidental lung nodule, > 68mm and < 1mm 02/15/2012   83mm RUL subpleural nodule found on CXR screening for TB due to h/o TB exposure as a child with positive ppd.  Recheck CT scan in 6-12 mos (March - Sept 2014) Repeat CT scan 01/2013 shows: Stable subpleural nodule in the right upper lobe measuring 5 mm. If this patient is at low risk for lung cancer then no further workup is necessary.  If this patient is at increased risk for lung cancer then foll   Nontraumatic tear of skin    right side of vagina skin since 06-12-15, small amount of blood when wipes   Prediabetes 11/21/2012   Tuberculosis    exposed as a young child    Patient Active Problem List   Diagnosis Date Noted   Influenza vaccine refused 06/13/2020   Dermatitis 06/13/2020   Mixed hyperlipidemia 06/13/2020   Prediabetes 11/21/2012   Breast fibrocystic disorder 11/21/2012   Chest pain 11/21/2012   Anxiety state, unspecified 11/21/2012   Incidental lung nodule, >  73mm and < 49mm 02/15/2012    Past Surgical History:  Procedure Laterality Date   BREAST BIOPSY Right 05/28/2016   DENSE STROMAL FIBROSIS    COLONOSCOPY WITH PROPOFOL N/A 04/24/2016   Procedure: COLONOSCOPY WITH PROPOFOL;  Surgeon: Garlan Fair, MD;  Location: [facility];  Service: Endoscopy;  Laterality: N/A;   EYE SURGERY Bilateral    lens replacements   FRACTURE SURGERY Right 2003   fibula   surgery for fibroids  2000    OB History   No obstetric history on file.      Home Medications    Prior to Admission medications   Medication Sig Start Date End Date Taking? Authorizing Provider  benzonatate (TESSALON) 100 MG capsule Take 1 capsule (100 mg total) by mouth 3 (three) times daily as needed for cough. 04/27/21  Yes Jeslyn Amsler, Myrene Galas, MD  predniSONE (DELTASONE) 20 MG tablet Take 1 tablet (20 mg total) by mouth daily for 5 days. 04/27/21 05/02/21 Yes Kennady Zimmerle, Myrene Galas, MD  Ascorbic Acid (VITAMIN C) 100 MG tablet Take 100 mg by mouth daily.    [provider]  ferrous sulfate 325 (65 FE) MG tablet Take 325 mg by mouth daily with breakfast.    [provider]  Multiple Vitamins-Minerals (EYE VITAMINS PO) Take  by mouth.    [provider]    Family History Family History  Problem Relation Age of Onset   Diabetes Brother    Hypertension Brother    Alcohol abuse Brother    Mental illness Brother        nervous breakdown   Stroke Maternal Grandmother    Depression Paternal Grandmother    Arthritis Mother    COPD Father 41       decsd due to lack of oxygen   Hypertension Father    Alcohol abuse Father    Gout Father    Alcohol abuse Brother    Hypertension Brother     Social History Social History   Tobacco Use   Smoking status: Never   Smokeless tobacco: Never  Vaping Use   Vaping Use: Never used  Substance Use Topics   Alcohol use: No   Drug use: No     Allergies   Latex   Review of Systems Review of Systems   Constitutional: Negative.   HENT: Negative.    Respiratory:  Positive for cough and chest tightness. Negative for shortness of breath, wheezing and stridor.   Cardiovascular:  Negative for chest pain.  Gastrointestinal: Negative.     Physical Exam Triage Vital Signs ED Triage Vitals  Enc Vitals Group     BP 04/27/21 0931 128/79     Pulse Rate 04/27/21 0931 82     Resp 04/27/21 0931 18     Temp 04/27/21 0931 98.5 F (36.9 C)     Temp Source 04/27/21 0931 Oral     SpO2 04/27/21 0931 99 %     Weight --      Height --      Head Circumference --      Peak Flow --      Pain Score 04/27/21 0932 0     Pain Loc --      Pain Edu? --      Excl. in [place]? --    No data found.  Updated Vital Signs BP 128/79 (BP Location: Right Arm)    Pulse 82    Temp 98.5 F (36.9 C) (Oral)    Resp 18    SpO2 99%   Visual Acuity Right Eye Distance:   Left Eye Distance:   Bilateral Distance:    Right Eye Near:   Left Eye Near:    Bilateral Near:     Physical Exam Vitals and nursing note reviewed.  Constitutional:      General: She is not in acute distress.    Appearance: She is not ill-appearing or diaphoretic.  HENT:     Right Ear: Tympanic membrane normal.     Left Ear: Tympanic membrane normal.  Cardiovascular:     Rate and Rhythm: Normal rate and regular rhythm.     Pulses: Normal pulses.     Heart sounds: Normal heart sounds.  Pulmonary:     Effort: Pulmonary effort is normal. No respiratory distress.     Breath sounds: Normal breath sounds. No stridor.  Musculoskeletal:        General: Normal range of motion.  Neurological:     Mental Status: She is alert.     UC Treatments / Results  Labs (all labs ordered are listed, but only abnormal results are displayed) Labs Reviewed - No data to display  EKG   Radiology DG Chest 2 View  Result Date: 04/26/2021 CLINICAL DATA:  cough EXAM: CHEST - 2 VIEW  COMPARISON:  August 05, 2012. FINDINGS: Mildly prominent interstitial  markings, which are chronic. No consolidation. Subcentimeter nodular opacity in the right upper lobe is stable. Biapical pleuroparenchymal scarring. No visible pleural effusions or pneumothorax. Polyarticular degenerative change. IMPRESSION: No evidence of acute cardiopulmonary disease. Electronically Signed   By: Margaretha Sheffield M.D.   On: 04/26/2021 17:02    Procedures Procedures (including critical care time)  Medications Ordered in UC Medications - No data to display  Initial Impression / Assessment and Plan / UC Course  I have reviewed the triage vital signs and the nursing notes.  Pertinent labs & imaging results that were available during my care of the patient were reviewed by me and considered in my medical decision making (see chart for details).     1.  Acute bronchitis: Short course of steroids Tessalon Perles as needed for cough Maintain adequate hydration Chest x-ray from 12/14 is negative for lung infiltrate Return to urgent care if symptoms worsen. Final Clinical Impressions(s) / UC Diagnoses   Final diagnoses:  Acute bronchitis, unspecified organism     Discharge Instructions      This take medications as prescribed Maintain adequate hydration This is likely bronchitis with irritation in your lungs If blood in the sputum worsens please call your primary care physician for follow-up Feel free to return to urgent care if you have any other concerns.   ED Prescriptions     Medication Sig Dispense Auth. Provider   predniSONE (DELTASONE) 20 MG tablet Take 1 tablet (20 mg total) by mouth daily for 5 days. 5 tablet Dillion Stowers, Myrene Galas, MD   benzonatate (TESSALON) 100 MG capsule Take 1 capsule (100 mg total) by mouth 3 (three) times daily as needed for cough. 21 capsule Aquanetta Schwarz, Myrene Galas, MD      PDMP not reviewed this encounter.   Chase Picket, MD 04/27/21 1034

## 2021-04-27 NOTE — ED Triage Notes (Signed)
Pt c/o cough since Sunday and was seen here yesterday. States coughing up blood today.

## 2021-04-27 NOTE — Discharge Instructions (Addendum)
This take medications as prescribed Maintain adequate hydration This is likely bronchitis with irritation in your lungs If blood in the sputum worsens please call your primary care physician for follow-up Feel free to return to urgent care if you have any other concerns.

## 2021-04-27 NOTE — Telephone Encounter (Signed)
Patient came to the office requesting an appointment with you  regarding her living situation she said that Urgent care told her to come here .  Please, call her thank you

## 2021-04-28 NOTE — Telephone Encounter (Signed)
Pt called again about an urgent situation she is having with her living situation/ please advise/ pt asked to speak with social worker asap

## 2021-05-02 ENCOUNTER — Other Ambulatory Visit: Payer: Self-pay

## 2021-05-02 ENCOUNTER — Ambulatory Visit: Payer: Medicare HMO | Admitting: Orthopaedic Surgery

## 2021-05-02 ENCOUNTER — Encounter: Payer: Self-pay | Admitting: Orthopaedic Surgery

## 2021-05-02 ENCOUNTER — Ambulatory Visit (INDEPENDENT_AMBULATORY_CARE_PROVIDER_SITE_OTHER): Payer: Medicare HMO

## 2021-05-02 VITALS — Ht 63.0 in | Wt 145.0 lb

## 2021-05-02 DIAGNOSIS — G8929 Other chronic pain: Secondary | ICD-10-CM | POA: Diagnosis not present

## 2021-05-02 DIAGNOSIS — M25561 Pain in right knee: Secondary | ICD-10-CM

## 2021-05-02 MED ORDER — MELOXICAM 7.5 MG PO TABS: 7.5000 mg | ORAL_TABLET | Freq: Two times a day (BID) | ORAL | 2 refills | Status: DC | PRN

## 2021-05-04 NOTE — Progress Notes (Signed)
Office Visit Note   Patient: Emily [name]           Date of Birth: [birth year]           MRN: [id number] Visit Date: 05/02/2021              Requested by: Ladell Pier, MD [address] [place],  [place] [id number] PCP: Ladell Pier, MD   Assessment & Plan: Visit Diagnoses:  1. Chronic pain of right knee     Plan: Impression is right knee osteoarthritis with joint effusion.  She declined an injection and aspiration today rather try prescription of Mobic first.  She will follow-up with [place] if Mobic is ineffective.  Follow-Up Instructions: No follow-ups on file.   Orders:  Orders Placed This Encounter  Procedures   XR KNEE 3 VIEW RIGHT   Meds ordered this encounter  Medications   meloxicam (MOBIC) 7.5 MG tablet    Sig: Take 1 tablet (7.5 mg total) by mouth 2 (two) times daily as needed for pain.    Dispense:  30 tablet    Refill:  2      Procedures: No procedures performed   Clinical Data: No additional findings.   Subjective: Chief Complaint  Patient presents with   Right Knee - Pain    Emily [name] is a 70 year old female here for evaluation of right knee pain since October.  Denies any injuries.  Pain is improved with the brace.  She feels swelling in the knee.  Initially it was weak and painful especially with stairs.  She has not taking any pain medications.  She states that she may have done a lot of yard work in October.   Review of Systems  Constitutional: Negative.   HENT: Negative.    Eyes: Negative.   Respiratory: Negative.    Cardiovascular: Negative.   Endocrine: Negative.   Musculoskeletal: Negative.   Neurological: Negative.   Hematological: Negative.   Psychiatric/Behavioral: Negative.    All other systems reviewed and are negative.   Objective: Vital Signs: Ht 5\' 3"  (1.6 m)    Wt 145 lb (65.8 kg)    BMI 25.69 kg/m   Physical Exam Vitals and nursing note reviewed.  Constitutional:      Appearance: She is well-developed.   Pulmonary:     Effort: Pulmonary effort is normal.  Skin:    General: Skin is warm.     Capillary Refill: Capillary refill takes less than 2 seconds.  Neurological:     Mental Status: She is alert and oriented to person, place, and time.  Psychiatric:        Behavior: Behavior normal.        Thought Content: Thought content normal.        Judgment: Judgment normal.    Ortho Exam  Right knee shows effusion.  Range of motion is limited to about 100 degrees with pain.  2+ patellofemoral crepitus.  Collaterals and cruciates are stable.  Specialty Comments:  No specialty comments available.  Imaging: No results found.   PMFS History: Patient Active Problem List   Diagnosis Date Noted   Influenza vaccine refused 06/13/2020   Dermatitis 06/13/2020   Mixed hyperlipidemia 06/13/2020   Prediabetes 11/21/2012   Breast fibrocystic disorder 11/21/2012   Chest pain 11/21/2012   Anxiety state, unspecified 11/21/2012   Incidental lung nodule, > 14mm and < 46mm 02/15/2012   Past Medical History:  Diagnosis Date   Anemia    iron runs  low   Anxiety    Anxiety state, unspecified 11/21/2012   Breast fibrocystic disorder 11/21/2012   Bronchitis    Cataract    Chest pain 11/21/2012   Normal stress echocardiogram 11/2010    History of chicken pox    History of uterine fibroid    Incidental lung nodule, > 36mm and < 3mm 02/15/2012   9mm RUL subpleural nodule found on CXR screening for TB due to h/o TB exposure as a child with positive ppd.  Recheck CT scan in 6-12 mos (March - Sept 2014) Repeat CT scan 01/2013 shows: Stable subpleural nodule in the right upper lobe measuring 5 mm. If this patient is at low risk for lung cancer then no further workup is necessary.  If this patient is at increased risk for lung cancer then foll   Nontraumatic tear of skin    right side of vagina skin since 06-12-15, small amount of blood when wipes   Prediabetes 11/21/2012   Tuberculosis    exposed as a young  child    Family History  Problem Relation Age of Onset   Diabetes Brother    Hypertension Brother    Alcohol abuse Brother    Mental illness Brother        nervous breakdown   Stroke Maternal Grandmother    Depression Paternal Grandmother    Arthritis Mother    COPD Father 8       decsd due to lack of oxygen   Hypertension Father    Alcohol abuse Father    Gout Father    Alcohol abuse Brother    Hypertension Brother     Past Surgical History:  Procedure Laterality Date   BREAST BIOPSY Right 05/28/2016   DENSE STROMAL FIBROSIS    COLONOSCOPY WITH PROPOFOL N/A 04/24/2016   Procedure: COLONOSCOPY WITH PROPOFOL;  Surgeon: Garlan Fair, MD;  Location: [facility];  Service: Endoscopy;  Laterality: N/A;   EYE SURGERY Bilateral    lens replacements   FRACTURE SURGERY Right 2003   fibula   surgery for fibroids  2000   Social History   Occupational History   Occupation: Day-care  Tobacco Use   Smoking status: Never   Smokeless tobacco: Never  Vaping Use   Vaping Use: Never used  Substance and Sexual Activity   Alcohol use: No   Drug use: No   Sexual activity: Never

## 2021-05-23 NOTE — Telephone Encounter (Signed)
I spoke with pt and she mentioned that she is having housing concerns. She mentioned that her home has problems with leaks and other needed repairs that she believes causes her to get sick frequently. Reports that her daughter wants her to move out. Reports that she owns her home is interested in independent senior living or an apartment. [facility] provided pt with information on independent senior living and [employer]. [facility] encouraged pt to contact [employer] to assist with home repairs.

## 2021-05-29 ENCOUNTER — Other Ambulatory Visit: Payer: Self-pay | Admitting: Internal Medicine

## 2021-05-29 NOTE — Telephone Encounter (Signed)
Requested medication (s) are due for refill today - no  Requested medication (s) are on the active medication list -no  Future visit scheduled -yes  Last refill: 03/14/21  Notes to clinic: Request RF: medication no longer on current medication list- sent for review of request  Requested Prescriptions  Pending Prescriptions Disp Refills   atorvastatin (LIPITOR) 10 MG tablet [Pharmacy Med Name: ATORVASTATIN 10 MG TABLET] 90 tablet 1    Sig: TAKE 1 TABLET BY MOUTH EVERY DAY     Cardiovascular:  Antilipid - Statins Passed - 05/29/2021  6:31 AM      Passed - Total Cholesterol in normal range and within 360 days    Cholesterol, Total  Date Value Ref Range Status  06/13/2020 198 100 - 199 mg/dL Final          Passed - LDL in normal range and within 360 days    LDL Chol Calc (NIH)  Date Value Ref Range Status  06/13/2020 90 0 - 99 mg/dL Final          Passed - HDL in normal range and within 360 days    HDL  Date Value Ref Range Status  06/13/2020 99 >39 mg/dL Final          Passed - Triglycerides in normal range and within 360 days    Triglycerides  Date Value Ref Range Status  06/13/2020 45 0 - 149 mg/dL Final          Passed - Patient is not pregnant      Passed - Valid encounter within last 12 months    Recent Outpatient Visits           4 months ago Chronic right shoulder pain   [place], Neoma Laming B, MD   8 months ago Encounter for [employer] annual wellness exam   [place], Neoma Laming B, MD   9 months ago TMJ dysfunction   [place], Vernia Buff, NP   11 months ago Encounter to establish care   [place], MD   3 years ago Prediabetes   [facility], Laurey Arrow, MD       Future Appointments             In 2 months Ladell Pier, MD [place]                Requested Prescriptions  Pending Prescriptions Disp Refills   atorvastatin (LIPITOR) 10 MG tablet [Pharmacy Med Name: ATORVASTATIN 10 MG TABLET] 90 tablet 1    Sig: TAKE 1 TABLET BY MOUTH EVERY DAY     Cardiovascular:  Antilipid - Statins Passed - 05/29/2021  6:31 AM      Passed - Total Cholesterol in normal range and within 360 days    Cholesterol, Total  Date Value Ref Range Status  06/13/2020 198 100 - 199 mg/dL Final          Passed - LDL in normal range and within 360 days    LDL Chol Calc (NIH)  Date Value Ref Range Status  06/13/2020 90 0 - 99 mg/dL Final          Passed - HDL in normal range and within 360 days    HDL  Date Value Ref Range Status  06/13/2020 99 >39 mg/dL Final  Passed - Triglycerides in normal range and within 360 days    Triglycerides  Date Value Ref Range Status  06/13/2020 45 0 - 149 mg/dL Final          Passed - Patient is not pregnant      Passed - Valid encounter within last 12 months    Recent Outpatient Visits           4 months ago Chronic right shoulder pain   [place], MD   8 months ago Encounter for [employer] annual wellness exam   [place] Ladell Pier, MD   9 months ago TMJ dysfunction   [place] Gildardo Pounds, NP   11 months ago Encounter to establish care   [place], MD   3 years ago Prediabetes   [facility], Laurey Arrow, MD       Future Appointments             In 2 months Ladell Pier, MD [place]

## 2021-07-06 ENCOUNTER — Ambulatory Visit: Payer: Self-pay | Admitting: *Deleted

## 2021-07-06 DIAGNOSIS — M1711 Unilateral primary osteoarthritis, right knee: Secondary | ICD-10-CM

## 2021-07-06 MED ORDER — MELOXICAM 7.5 MG PO TABS: 7.5000 mg | ORAL_TABLET | Freq: Every day | ORAL | 2 refills | Status: DC | PRN

## 2021-07-06 NOTE — Addendum Note (Signed)
Addended by: Karle Plumber B on: 07/06/2021 05:59 PM   Modules accepted: Orders

## 2021-07-06 NOTE — Telephone Encounter (Signed)
°  Chief Complaint: left knee "Stiffness" Side of leg swollen. Mild, achy Symptoms: Left knee stiff, no pain. Worse with walking. Side of left leg mild swelling, no redness or warmth Frequency: 5 months ago Pertinent Negatives: Patient denies calf pain, redness, warmth Disposition: [] ED /[] Urgent Care (no appt availability in office) / [] Appointment(In office/virtual)/ []  [facility]/ [] Home Care/ [] Refused Recommended Disposition /[] [facility]/ [x]  Follow-up with PCP Additional Notes: Pt has appt in April. Requesting earlier appt as knee has worsened, side of leg swelling. Assured pt NT would route to practice for PCPs review. Advised UC for worsening symptoms, redness, warmth develop, pain, calf pain.Verbalizes understanding.    Reason for Disposition  Swollen knee joint (Exception: area of localized swelling which is itchy)  Answer Assessment - Initial Assessment Questions 1. LOCATION: "Where is the swelling located?"  (e.g., left, right, both knees)     Right, side of leg below knee 2. SIZE and DESCRIPTION: "What does the swelling look like?"  (e.g., entire knee, localized)     Side of leg not swollen, just knee and left ankle swollen 3. ONSET: "When did the swelling start?" "Does it come and go, or is it there all the time?"     5 months 4. PAIN: "Is there any pain?" If Yes, ask: "How bad is it?" (Scale 1-10; or mild, moderate, severe)     No pain in knee, stiffness 5. SETTING: "Has there been any recent work, exercise or other activity that involved that part of the body?"      Worse when up on it for a while 6. AGGRAVATING FACTORS: "What makes the knee swelling worse?" (e.g., walking, climbing stairs, running)     Walking, stairs worse 7. ASSOCIATED SYMPTOMS: "Is there any pain or redness?"     no 8. OTHER SYMPTOMS: "Do you have any other symptoms?" (e.g., chest pain, difficulty breathing, fever, calf pain) Mild ache side of leg,a little warm one day, not  presently.  Protocols used: Knee Swelling-A-AH

## 2021-07-06 NOTE — Telephone Encounter (Signed)
Will forward to provider. Pt sees Orthopedic for right knee

## 2021-07-13 ENCOUNTER — Ambulatory Visit (INDEPENDENT_AMBULATORY_CARE_PROVIDER_SITE_OTHER): Payer: Medicare HMO

## 2021-07-13 ENCOUNTER — Encounter: Payer: Self-pay | Admitting: Podiatry

## 2021-07-13 ENCOUNTER — Encounter: Payer: Self-pay | Admitting: Physician Assistant

## 2021-07-13 ENCOUNTER — Other Ambulatory Visit: Payer: Self-pay

## 2021-07-13 ENCOUNTER — Ambulatory Visit: Payer: Medicare HMO | Admitting: Podiatry

## 2021-07-13 ENCOUNTER — Ambulatory Visit: Payer: Medicare HMO | Admitting: Orthopaedic Surgery

## 2021-07-13 DIAGNOSIS — M1711 Unilateral primary osteoarthritis, right knee: Secondary | ICD-10-CM

## 2021-07-13 DIAGNOSIS — M779 Enthesopathy, unspecified: Secondary | ICD-10-CM

## 2021-07-13 NOTE — Progress Notes (Signed)
? ?Office Visit Note ?  ?Patient: Emily [name]           ?Date of Birth: October 19, 1950           ?MRN: [id number] ?Visit Date: 07/13/2021 ?             ?Requested by: Ladell Pier, MD ?[address] ?[place],  [place] [id number] ?PCP: Ladell Pier, MD ? ? ?Assessment & Plan: ?Visit Diagnoses:  ?1. Primary osteoarthritis of right knee   ? ? ?Plan: Impression is right knee osteoarthritis.  The patient's symptoms appear to be coming from her underlying osteoarthritis.  We have discussed trying a cortisone injection to see if this would help with the stiffness but she is not interested as she is in no pain.  She would like to start a home exercise program for which we have provided her knee exercises today.  She will follow-up with [place] as needed. ? ?Follow-Up Instructions: Return if symptoms worsen or fail to improve.  ? ?Orders:  ?No orders of the defined types were placed in this encounter. ? ?No orders of the defined types were placed in this encounter. ? ? ? ? Procedures: ?No procedures performed ? ? ?Clinical Data: ?No additional findings. ? ? ?Subjective: ?Chief Complaint  ?Patient presents with  ? Right Knee - Pain  ? ? ?HPI patient is a 71 year old female who comes in today with stiffness to the right knee.  This started about 3 to 4 months ago without a specific injury.  She denies any actual pain.  The symptoms appear to be worse if she has been sitting for a long period of time and goes to stand up.  She was prescribed Mobic back in the fall which she has not taken as she has no pain. ? ?Review of Systems as detailed in HPI.  All others reviewed and are negative. ? ? ?Objective: ?Vital Signs: There were no vitals taken for this visit. ? ?Physical Exam well-developed well-nourished female in no acute distress.  Alert and oriented x3. ? ?Ortho Exam right knee exam shows a small effusion.  Range of motion 0 to 115 degrees.  Mild medial joint line tenderness.  She is neurovascular intact  distally. ? ?Specialty Comments:  ?No specialty comments available. ? ?Imaging: ?No new imaging ? ? ?PMFS History: ?Patient Active Problem List  ? Diagnosis Date Noted  ? Primary osteoarthritis of right knee 07/15/2021  ? Influenza vaccine refused 06/13/2020  ? Dermatitis 06/13/2020  ? Mixed hyperlipidemia 06/13/2020  ? Prediabetes 11/21/2012  ? Breast fibrocystic disorder 11/21/2012  ? Chest pain 11/21/2012  ? Anxiety state, unspecified 11/21/2012  ? Incidental lung nodule, > 58mm and < 74mm 02/15/2012  ? ?Past Medical History:  ?Diagnosis Date  ? Anemia   ? iron runs low  ? Anxiety   ? Anxiety state, unspecified 11/21/2012  ? Breast fibrocystic disorder 11/21/2012  ? Bronchitis   ? Cataract   ? Chest pain 11/21/2012  ? Normal stress echocardiogram 11/2010   ? History of chicken pox   ? History of uterine fibroid   ? Incidental lung nodule, > 16mm and < 86mm 02/15/2012  ? 71mm RUL subpleural nodule found on CXR screening for TB due to h/o TB exposure as a child with positive ppd.  Recheck CT scan in 6-12 mos (March - Sept 2014) Repeat CT scan 01/2013 shows: Stable subpleural nodule in the right upper lobe measuring 5 mm. If this patient is at low risk  for lung cancer then no further workup is necessary.  If this patient is at increased risk for lung cancer then foll  ? Nontraumatic tear of skin   ? right side of vagina skin since 06-12-15, small amount of blood when wipes  ? Prediabetes 11/21/2012  ? Tuberculosis   ? exposed as a young child  ?  ?Family History  ?Problem Relation Age of Onset  ? Diabetes Brother   ? Hypertension Brother   ? Alcohol abuse Brother   ? Mental illness Brother   ?     nervous breakdown  ? Stroke Maternal Grandmother   ? Depression Paternal Grandmother   ? Arthritis Mother   ? COPD Father 30  ?     decsd due to lack of oxygen  ? Hypertension Father   ? Alcohol abuse Father   ? Gout Father   ? Alcohol abuse Brother   ? Hypertension Brother   ?  ?Past Surgical History:  ?Procedure Laterality Date  ?  BREAST BIOPSY Right 05/28/2016  ? DENSE STROMAL FIBROSIS   ? COLONOSCOPY WITH PROPOFOL N/A 04/24/2016  ? Procedure: COLONOSCOPY WITH PROPOFOL;  Surgeon: Garlan Fair, MD;  Location: [facility];  Service: Endoscopy;  Laterality: N/A;  ? EYE SURGERY Bilateral   ? lens replacements  ? FRACTURE SURGERY Right 2003  ? fibula  ? surgery for fibroids  2000  ? ?Social History  ? ?Occupational History  ? Occupation: Day-care  ?Tobacco Use  ? Smoking status: Never  ? Smokeless tobacco: Never  ?Vaping Use  ? Vaping Use: Never used  ?Substance and Sexual Activity  ? Alcohol use: No  ? Drug use: No  ? Sexual activity: Never  ? ? ? ? ? ? ?

## 2021-07-14 NOTE — Progress Notes (Signed)
Subjective:  ? ?Patient ID: Emily [name], female   DOB: 71 y.o.   MRN: [id number]  ? ?HPI ?Patient states she traumatized her right big toe several months ago and is concerned about the fact that it still hurting and whether or not there could be a bony injury that is part of what she is experiencing ? ? ?ROS ? ? ?   ?Objective:  ?Physical Exam  ?Neurovascular status intact with inflammation of a mild nature mild swelling around the inner phalangeal joint right big toe ? ?   ?Assessment:  ?Trauma to the right big toe localized ? ?   ?Plan:  ?H&P conditions reviewed and recommended wider shoes and soaks but no other treatments.  Patient will be seen back as needed ? ?X-rays indicate there is no indications of pathology of the joint and it appears to be soft tissue at this time ?   ? ? ?

## 2021-07-15 DIAGNOSIS — M1711 Unilateral primary osteoarthritis, right knee: Secondary | ICD-10-CM | POA: Insufficient documentation

## 2021-07-18 DIAGNOSIS — H11153 Pinguecula, bilateral: Secondary | ICD-10-CM | POA: Diagnosis not present

## 2021-07-18 DIAGNOSIS — H35413 Lattice degeneration of retina, bilateral: Secondary | ICD-10-CM | POA: Diagnosis not present

## 2021-07-18 DIAGNOSIS — Z961 Presence of intraocular lens: Secondary | ICD-10-CM | POA: Diagnosis not present

## 2021-07-21 ENCOUNTER — Telehealth: Payer: Self-pay | Admitting: Internal Medicine

## 2021-07-21 DIAGNOSIS — M1711 Unilateral primary osteoarthritis, right knee: Secondary | ICD-10-CM

## 2021-07-21 NOTE — Telephone Encounter (Signed)
Copied from [place] [id number]. Topic: Referral - Question ?>> Jul 21, 2021  4:51 PM Greggory Keen D wrote: ?Reason for CRM: Pt called asking if she could be referred to PT for knee pain.  She went to ortho and they told her it ws arthritis. ? ?CB#  [phone] ?

## 2021-07-28 ENCOUNTER — Ambulatory Visit: Payer: Medicare HMO | Admitting: Internal Medicine

## 2021-08-25 ENCOUNTER — Ambulatory Visit: Payer: Medicare HMO | Admitting: Internal Medicine

## 2021-09-04 ENCOUNTER — Ambulatory Visit: Payer: Medicare HMO | Admitting: Physical Therapy

## 2021-10-23 ENCOUNTER — Ambulatory Visit: Payer: Medicare HMO

## 2021-10-30 NOTE — Therapy (Signed)
OUTPATIENT PHYSICAL THERAPY EVALUATION   Patient Name: Emily [name] MRN: [id number] DOB:06/18/1950, 71 y.o., female Today's Date: 11/01/2021   PT End of Session - 11/01/21 1101     Visit Number 1    Number of Visits 8    Date for PT Re-Evaluation 12/27/21    Authorization Type [employer] MCR    Authorization Time Period FOTO by 6th, KX by 15th    Progress Note Due on Visit 10    PT Start Time 1045    PT Stop Time 1130    PT Time Calculation (min) 45 min    Activity Tolerance Patient tolerated treatment well    Behavior During Therapy [facility] for tasks assessed/performed             Past Medical History:  Diagnosis Date   Anemia    iron runs low   Anxiety    Anxiety state, unspecified 11/21/2012   Breast fibrocystic disorder 11/21/2012   Bronchitis    Cataract    Chest pain 11/21/2012   Normal stress echocardiogram 11/2010    History of chicken pox    History of uterine fibroid    Incidental lung nodule, > 64m and < 8635m10/08/2011   35m61mUL subpleural nodule found on CXR screening for TB due to h/o TB exposure as a child with positive ppd.  Recheck CT scan in 6-12 mos (March - Sept 2014) Repeat CT scan 01/2013 shows: Stable subpleural nodule in the right upper lobe measuring 5 mm. If this patient is at low risk for lung cancer then no further workup is necessary.  If this patient is at increased risk for lung cancer then foll   Nontraumatic tear of skin    right side of vagina skin since 06-12-15, small amount of blood when wipes   Prediabetes 11/21/2012   Tuberculosis    exposed as a young child   Past Surgical History:  Procedure Laterality Date   BREAST BIOPSY Right 05/28/2016   DENSE STROMAL FIBROSIS    COLONOSCOPY WITH PROPOFOL N/A 04/24/2016   Procedure: COLONOSCOPY WITH PROPOFOL;  Surgeon: MarGarlan FairD;  Location: [facility];  Service: Endoscopy;  Laterality: N/A;   EYE SURGERY Bilateral    lens replacements   FRACTURE SURGERY Right 2003   fibula   surgery  for fibroids  2000   Patient Active Problem List   Diagnosis Date Noted   Primary osteoarthritis of right knee 07/15/2021   Influenza vaccine refused 06/13/2020   Dermatitis 06/13/2020   Mixed hyperlipidemia 06/13/2020   Prediabetes 11/21/2012   Breast fibrocystic disorder 11/21/2012   Chest pain 11/21/2012   Anxiety state, unspecified 11/21/2012   Incidental lung nodule, > 3mm335md < 8mm 30m04/2013    PCP: JohnsLadell Pier REFERRING PROVIDER: JohnsLadell Pier REFERRING DIAG: Primary osteoarthritis of right knee  THERAPY DIAG:  Chronic pain of right knee  Muscle weakness (generalized)  Other abnormalities of gait and mobility  Rationale for Evaluation and Treatment Rehabilitation  ONSET DATE: 02/2021   SUBJECTIVE:  SUBJECTIVE STATEMENT: Patient reports right knee pain since October of last year. She states she had to get up on the roof and she over-did it. She also has to do a lot to take care of her house. Patient reports she had an accident in 2003 where they had to go through her right knee and put a rod down to the ankle. Patient reports her knee gets stiff with activity and a  lot of standing, housework or yard work. Lately, she feels like something is twisting in her knee and it just happens when she moving which scares her.  PERTINENT HISTORY: Right tibial nail 2003  PAIN:  Are you having pain? Yes:  NPRS scale: 0/10 (5/10 with activity) Pain location: Right knee Pain description: Intermittent, stiff, aching Aggravating factors: Standing, housework, yard work Relieving factors: Rest, getting off knee, using ACE wrap, medication  PRECAUTIONS: None  WEIGHT BEARING RESTRICTIONS No  FALLS:  Has patient fallen in last 6 months? No  LIVING ENVIRONMENT: Lives with: lives alone Lives in: House/apartment Stairs: Yes: Internal: 10 steps; on left going up and External: 2 steps; none  OCCUPATION: Retired  PLOF: Independent  PATIENT GOALS: Reduce  knee pain and stiffness   OBJECTIVE:  DIAGNOSTIC FINDINGS:  Knee x-ray: Significant narrowing of medial compartment and DJD of the patellofemoral  compartment.  Presence of tibial nail.   PATIENT SURVEYS:  FOTO 39% functional status  COGNITION: Overall cognitive status: Within functional limits for tasks assessed     SENSATION: WFL  MUSCLE LENGTH: Bilateral quad flexibility deficit  PALPATION: Mildly tender to palpation right knee medial joint line  LOWER EXTREMITY ROM:  Active ROM Right eval Left eval  Knee flexion 117 121  Knee extension 3 0   LOWER EXTREMITY MMT:  MMT Right eval Left eval  Hip flexion 4 4  Hip extension 4- 4-  Hip abduction 4- 4  Knee flexion 4+ 4+  Knee extension 4 4+   FUNCTIONAL TESTS:  5 times sit to stand: 14 seconds  GAIT: Assistive device utilized: None Level of assistance: Complete Independence Comments: Slightly antalgic on right   TODAY'S TREATMENT: SLR x 10 each Bridge x 10 Clamshell with red x 15 each Sit to stand x 10   PATIENT EDUCATION:  Education details: Exam findings, POC, HEP Person educated: Patient Education method: Explanation, Demonstration, Tactile cues, Verbal cues, and Handouts Education comprehension: verbalized understanding, returned demonstration, verbal cues required, tactile cues required, and needs further education  HOME EXERCISE PROGRAM: Access Code: [email or web address]   ASSESSMENT: CLINICAL IMPRESSION: Patient is a 71 y.o. female who was seen today for physical therapy evaluation and treatment for chronic right knee pain and stiffness. She demonstrates slight limitation in knee mobility and gross strength deficit of the knees and hips likely leading to decreased tolerance for activities such as walking. She was provided exercises to initiate strengthening for the LE and patient would benefit from continued skilled PT to progress her mobility and strength in order to reduce pain and maximize functional  ability.    OBJECTIVE IMPAIRMENTS Abnormal gait, decreased activity tolerance, decreased ROM, decreased strength, impaired flexibility, improper body mechanics, and pain.   ACTIVITY LIMITATIONS standing, squatting, sleeping, and locomotion level  PARTICIPATION LIMITATIONS: cleaning, shopping, community activity, and yard work  [place], Past/current experiences, and Time since onset of injury/illness/exacerbation are also affecting patient's functional outcome.   REHAB POTENTIAL: Good  CLINICAL DECISION MAKING: Stable/uncomplicated  EVALUATION COMPLEXITY: Low   GOALS: Goals reviewed with patient? Yes  SHORT TERM GOALS: Target date: 11/29/2021   Patient will be I with initial HEP in order to progress with therapy. Baseline: HEP provided at eval Goal status: INITIAL  2.  PT will review FOTO with patient by 3rd visit in order to understand expected progress and outcome with therapy. Baseline: FOTO assessed at eval Goal status: INITIAL  3.  Patient will perform 5xSTS in </= 11 seconds to indicate improved LE  strength and functional mobility Baseline: 14 seconds Goal status: INITIAL  LONG TERM GOALS: Target date: 12/27/2021   Patient will be I with final HEP to maintain progress from PT. Baseline: HEP provided at eval Goal status: INITIAL  2.  Patient will report >/= 58% status on FOTO to indicate improved functional ability. Baseline: 39% functional status Goal status: INITIAL  3.  Patient will demonstrate right knee strength 5/5 MMT and hip strength >/= 4/5 MMT in order to improve standing and walking tolerance. Baseline: patient demonstrates gross knee and hip strength deficits Goal status: INITIAL  4.  Patient will report right knee pain </= 2/10 with activities including walking, yard work, and standing to reduce functional limitations Baseline: 5/10 pain with activity Goal status: INITIAL   PLAN: PT FREQUENCY: 1x/week  PT DURATION: 8  weeks  PLANNED INTERVENTIONS: Therapeutic exercises, Therapeutic activity, Neuromuscular re-education, Balance training, Gait training, Patient/Family education, Joint manipulation, Joint mobilization, Aquatic Therapy, Dry Needling, Cryotherapy, Moist heat, Taping, Manual therapy, and Re-evaluation  PLAN FOR NEXT SESSION: Review HEP and progress PRN, stretching for quads, progress knee and hip strengthening progressing to standing closed chain exercises, dynamic balance training   Hilda Blades, PT, DPT, LAT, ATC 11/01/21  12:35 PM Phone: [phone] Fax: [phone]

## 2021-10-31 ENCOUNTER — Other Ambulatory Visit: Payer: Self-pay | Admitting: Internal Medicine

## 2021-10-31 DIAGNOSIS — Z1231 Encounter for screening mammogram for malignant neoplasm of breast: Secondary | ICD-10-CM

## 2021-11-01 ENCOUNTER — Encounter: Payer: Self-pay | Admitting: Physical Therapy

## 2021-11-01 ENCOUNTER — Ambulatory Visit: Payer: Medicare HMO | Attending: Internal Medicine | Admitting: Physical Therapy

## 2021-11-01 ENCOUNTER — Other Ambulatory Visit: Payer: Self-pay

## 2021-11-01 DIAGNOSIS — M25561 Pain in right knee: Secondary | ICD-10-CM | POA: Insufficient documentation

## 2021-11-01 DIAGNOSIS — G8929 Other chronic pain: Secondary | ICD-10-CM | POA: Diagnosis not present

## 2021-11-01 DIAGNOSIS — R2689 Other abnormalities of gait and mobility: Secondary | ICD-10-CM | POA: Insufficient documentation

## 2021-11-01 DIAGNOSIS — M6281 Muscle weakness (generalized): Secondary | ICD-10-CM | POA: Diagnosis not present

## 2021-11-01 DIAGNOSIS — M1711 Unilateral primary osteoarthritis, right knee: Secondary | ICD-10-CM | POA: Diagnosis not present

## 2021-11-01 NOTE — Patient Instructions (Signed)
Access Code: [email or web address] URL: [email or web address] Date: 11/01/2021 Prepared by: Hilda Blades  Exercises - Active Straight Leg Raise with Quad Set  - 1 x daily - 3 sets - 10 reps - Bridge  - 1 x daily - 3 sets - 10 reps - Clam with Resistance  - 1 x daily - 3 sets - 15 reps - Sit to Stand  - 1 x daily - 3 sets - 10 reps

## 2021-11-02 ENCOUNTER — Ambulatory Visit
Admission: RE | Admit: 2021-11-02 | Discharge: 2021-11-02 | Disposition: A | Discharge status: Home / Self Care | Payer: Medicare HMO | Source: Ambulatory Visit | Attending: Internal Medicine | Admitting: Internal Medicine

## 2021-11-02 DIAGNOSIS — Z1231 Encounter for screening mammogram for malignant neoplasm of breast: Secondary | ICD-10-CM

## 2021-11-09 NOTE — Therapy (Signed)
OUTPATIENT PHYSICAL THERAPY TREATMENT NOTE   Patient Name: Emily [name] MRN: [id number] DOB:1950/09/22, 71 y.o., female Today's Date: 11/10/2021  PCP: Ladell Pier, MD   REFERRING PROVIDER: Ladell Pier, MD  END OF SESSION:   PT End of Session - 11/10/21 1050     Visit Number 2    Number of Visits 8    Date for PT Re-Evaluation 12/27/21    Authorization Type [employer] MCR    Authorization Time Period FOTO by 6th, KX by 15th    Progress Note Due on Visit 10    PT Start Time 1045    PT Stop Time 1130    PT Time Calculation (min) 45 min    Activity Tolerance Patient tolerated treatment well    Behavior During Therapy [facility] for tasks assessed/performed             Past Medical History:  Diagnosis Date   Anemia    iron runs low   Anxiety    Anxiety state, unspecified 11/21/2012   Breast fibrocystic disorder 11/21/2012   Bronchitis    Cataract    Chest pain 11/21/2012   Normal stress echocardiogram 11/2010    History of chicken pox    History of uterine fibroid    Incidental lung nodule, > 42m and < 811m10/08/2011   87m70mUL subpleural nodule found on CXR screening for TB due to h/o TB exposure as a child with positive ppd.  Recheck CT scan in 6-12 mos (March - Sept 2014) Repeat CT scan 01/2013 shows: Stable subpleural nodule in the right upper lobe measuring 5 mm. If this patient is at low risk for lung cancer then no further workup is necessary.  If this patient is at increased risk for lung cancer then foll   Nontraumatic tear of skin    right side of vagina skin since 06-12-15, small amount of blood when wipes   Prediabetes 11/21/2012   Tuberculosis    exposed as a young child   Past Surgical History:  Procedure Laterality Date   BREAST BIOPSY Right 05/28/2016   DENSE STROMAL FIBROSIS    COLONOSCOPY WITH PROPOFOL N/A 04/24/2016   Procedure: COLONOSCOPY WITH PROPOFOL;  Surgeon: MarGarlan FairD;  Location: [facility];  Service: Endoscopy;  Laterality: N/A;    EYE SURGERY Bilateral    lens replacements   FRACTURE SURGERY Right 2003   fibula   surgery for fibroids  2000   Patient Active Problem List   Diagnosis Date Noted   Primary osteoarthritis of right knee 07/15/2021   Influenza vaccine refused 06/13/2020   Dermatitis 06/13/2020   Mixed hyperlipidemia 06/13/2020   Prediabetes 11/21/2012   Breast fibrocystic disorder 11/21/2012   Chest pain 11/21/2012   Anxiety state, unspecified 11/21/2012   Incidental lung nodule, > 3mm59md < 8mm 46m04/2013    REFERRING DIAG: Primary osteoarthritis of right knee  THERAPY DIAG:  Chronic pain of right knee  Muscle weakness (generalized)  Other abnormalities of gait and mobility  Rationale for Evaluation and Treatment Rehabilitation  PERTINENT HISTORY: Right tibial nail 2003  PRECAUTIONS: None  SUBJECTIVE: Patient reports improvement in symptoms and consistency with exercises.  PAIN:  Are you having pain? Yes:  NPRS scale: 0/10 (5/10 with activity) Pain location: Right knee Pain description: Intermittent, stiff, aching Aggravating factors: Standing, housework, yard work Relieving factors: Rest, getting off knee, using ACE wrap, medication  PATIENT GOALS: Reduce knee pain and stiffness   OBJECTIVE: (objective measures completed at  initial evaluation unless otherwise dated) PATIENT SURVEYS:  FOTO 39% functional status   MUSCLE LENGTH: Bilateral quad flexibility deficit   PALPATION: Mildly tender to palpation right knee medial joint line   LOWER EXTREMITY ROM:   Active ROM Right eval Left eval  Knee flexion 117 121  Knee extension 3 0    LOWER EXTREMITY MMT:   MMT Right eval Left eval  Hip flexion 4 4  Hip extension 4- 4-  Hip abduction 4- 4  Knee flexion 4+ 4+  Knee extension 4 4+    FUNCTIONAL TESTS:  5 times sit to stand: 14 seconds   GAIT: Assistive device utilized: None Level of assistance: Complete Independence Comments: Slightly antalgic on right      TODAY'S TREATMENT: [facility] Adult PT Treatment:                                                DATE: 11/10/2021 Therapeutic Exercise: Supine hamstring stretch with strap 2 x 30 sec each Prone quad stretch 2 x 30 sec SLR 2 x 10 each Bridge 2 x 10 Clamshell with red 2 x 20 each Leg press 45# 2 x 10 Knee extension machine 20# 2 x 10 each LAQ with red x 10 each   [facility] Adult PT Treatment:                                                DATE: 11/01/2021 Therapeutic Exercise: SLR x 10 each Bridge x 10 Clamshell with red x 15 each Sit to stand x 10   PATIENT EDUCATION:  Education details: HEP update Person educated: Patient Education method: Explanation, Demonstration, Tactile cues, Verbal cues, Handout Education comprehension: verbalized understanding, returned demonstration, verbal cues required, tactile cues required, and needs further education   HOME EXERCISE PROGRAM: Access Code: [email or web address]     ASSESSMENT: CLINICAL IMPRESSION: Patient tolerated therapy well with no adverse effects. Therapy focused primarily on progressing LE strengthening with good tolerance. She was able to progress with machine strengthening this visit. She does require consistent cueing for proper exercise technique and pacing with her exercises. Updated HEP to progress leg strength at home. Patient would benefit from continued skilled PT to progress her mobility and strength in order to reduce pain and maximize functional ability.      OBJECTIVE IMPAIRMENTS Abnormal gait, decreased activity tolerance, decreased ROM, decreased strength, impaired flexibility, improper body mechanics, and pain.    ACTIVITY LIMITATIONS standing, squatting, sleeping, and locomotion level   PARTICIPATION LIMITATIONS: cleaning, shopping, community activity, and yard work   [place], Past/current experiences, and Time since onset of injury/illness/exacerbation are also affecting patient's functional outcome.       GOALS: Goals reviewed with patient? Yes   SHORT TERM GOALS: Target date: 11/29/2021    Patient will be I with initial HEP in order to progress with therapy. Baseline: HEP provided at eval Goal status: INITIAL   2.  PT will review FOTO with patient by 3rd visit in order to understand expected progress and outcome with therapy. Baseline: FOTO assessed at eval Goal status: INITIAL   3.  Patient will perform 5xSTS in </= 11 seconds to indicate improved LE strength and functional mobility Baseline: 14 seconds Goal  status: INITIAL   LONG TERM GOALS: Target date: 12/27/2021    Patient will be I with final HEP to maintain progress from PT. Baseline: HEP provided at eval Goal status: INITIAL   2.  Patient will report >/= 58% status on FOTO to indicate improved functional ability. Baseline: 39% functional status Goal status: INITIAL   3.  Patient will demonstrate right knee strength 5/5 MMT and hip strength >/= 4/5 MMT in order to improve standing and walking tolerance. Baseline: patient demonstrates gross knee and hip strength deficits Goal status: INITIAL   4.  Patient will report right knee pain </= 2/10 with activities including walking, yard work, and standing to reduce functional limitations Baseline: 5/10 pain with activity Goal status: INITIAL     PLAN: PT FREQUENCY: 1x/week   PT DURATION: 8 weeks   PLANNED INTERVENTIONS: Therapeutic exercises, Therapeutic activity, Neuromuscular re-education, Balance training, Gait training, Patient/Family education, Joint manipulation, Joint mobilization, Aquatic Therapy, Dry Needling, Cryotherapy, Moist heat, Taping, Manual therapy, and Re-evaluation   PLAN FOR NEXT SESSION: Review HEP and progress PRN, stretching for quads, progress knee and hip strengthening progressing to standing closed chain exercises, dynamic balance training    Hilda Blades, PT, DPT, LAT, ATC 11/10/21  11:40 AM Phone: [phone] Fax: [phone]

## 2021-11-10 ENCOUNTER — Other Ambulatory Visit: Payer: Self-pay

## 2021-11-10 ENCOUNTER — Ambulatory Visit: Payer: Medicare HMO | Admitting: Physical Therapy

## 2021-11-10 ENCOUNTER — Encounter: Payer: Self-pay | Admitting: Physical Therapy

## 2021-11-10 DIAGNOSIS — M6281 Muscle weakness (generalized): Secondary | ICD-10-CM | POA: Diagnosis not present

## 2021-11-10 DIAGNOSIS — M1711 Unilateral primary osteoarthritis, right knee: Secondary | ICD-10-CM | POA: Diagnosis not present

## 2021-11-10 DIAGNOSIS — R2689 Other abnormalities of gait and mobility: Secondary | ICD-10-CM

## 2021-11-10 DIAGNOSIS — G8929 Other chronic pain: Secondary | ICD-10-CM | POA: Diagnosis not present

## 2021-11-10 DIAGNOSIS — M25561 Pain in right knee: Secondary | ICD-10-CM | POA: Diagnosis not present

## 2021-11-10 NOTE — Patient Instructions (Signed)
Access Code: [email or web address] URL: [email or web address] Date: 11/10/2021 Prepared by: Hilda Blades  Exercises - Active Straight Leg Raise with Quad Set  - 1 x daily - 3 sets - 10 reps - Bridge  - 1 x daily - 3 sets - 10 reps - Clam with Resistance  - 1 x daily - 3 sets - 15 reps - Sit to Stand  - 1 x daily - 3 sets - 10 reps - Seated Knee Extension with Resistance  - 1 x daily - 3 sets - 10 reps

## 2021-11-15 ENCOUNTER — Other Ambulatory Visit: Payer: Self-pay

## 2021-11-15 ENCOUNTER — Ambulatory Visit: Payer: Medicare HMO | Attending: Internal Medicine | Admitting: Physical Therapy

## 2021-11-15 ENCOUNTER — Encounter: Payer: Self-pay | Admitting: Physical Therapy

## 2021-11-15 DIAGNOSIS — M25561 Pain in right knee: Secondary | ICD-10-CM | POA: Diagnosis not present

## 2021-11-15 DIAGNOSIS — R2689 Other abnormalities of gait and mobility: Secondary | ICD-10-CM | POA: Insufficient documentation

## 2021-11-15 DIAGNOSIS — M6281 Muscle weakness (generalized): Secondary | ICD-10-CM | POA: Insufficient documentation

## 2021-11-15 DIAGNOSIS — G8929 Other chronic pain: Secondary | ICD-10-CM | POA: Diagnosis not present

## 2021-11-15 NOTE — Patient Instructions (Signed)
Access Code: [email or web address] URL: [email or web address] Date: 11/15/2021 Prepared by: Hilda Blades  Exercises - Active Straight Leg Raise with Quad Set  - 1 x daily - 3 sets - 10 reps - Clam with Resistance  - 1 x daily - 3 sets - 15 reps - Sidelying Hip Abduction  - 1 x daily - 3 sets - 10 reps - Prone Hip Extension  - 1 x daily - 3 sets - 10 reps - Sit to Stand  - 1 x daily - 3 sets - 10 reps - Seated Knee Extension with Resistance  - 1 x daily - 3 sets - 10 reps

## 2021-11-15 NOTE — Therapy (Signed)
OUTPATIENT PHYSICAL THERAPY TREATMENT NOTE   Patient Name: Emily [name] MRN: [id number] DOB:10-Dec-1950, 71 y.o., female Today's Date: 11/15/2021  PCP: Ladell Pier, MD   REFERRING PROVIDER: Ladell Pier, MD   END OF SESSION:   PT End of Session - 11/15/21 1538     Visit Number 3    Number of Visits 8    Date for PT Re-Evaluation 12/27/21    Authorization Type [employer] MCR    Authorization Time Period FOTO by 6th, KX by 15th    Progress Note Due on Visit 10    PT Start Time 1533    PT Stop Time 1615    PT Time Calculation (min) 42 min    Activity Tolerance Patient tolerated treatment well    Behavior During Therapy [facility] for tasks assessed/performed              Past Medical History:  Diagnosis Date   Anemia    iron runs low   Anxiety    Anxiety state, unspecified 11/21/2012   Breast fibrocystic disorder 11/21/2012   Bronchitis    Cataract    Chest pain 11/21/2012   Normal stress echocardiogram 11/2010    History of chicken pox    History of uterine fibroid    Incidental lung nodule, > 40m and < 89m10/08/2011   33m60mUL subpleural nodule found on CXR screening for TB due to h/o TB exposure as a child with positive ppd.  Recheck CT scan in 6-12 mos (March - Sept 2014) Repeat CT scan 01/2013 shows: Stable subpleural nodule in the right upper lobe measuring 5 mm. If this patient is at low risk for lung cancer then no further workup is necessary.  If this patient is at increased risk for lung cancer then foll   Nontraumatic tear of skin    right side of vagina skin since 06-12-15, small amount of blood when wipes   Prediabetes 11/21/2012   Tuberculosis    exposed as a young child   Past Surgical History:  Procedure Laterality Date   BREAST BIOPSY Right 05/28/2016   DENSE STROMAL FIBROSIS    COLONOSCOPY WITH PROPOFOL N/A 04/24/2016   Procedure: COLONOSCOPY WITH PROPOFOL;  Surgeon: MarGarlan FairD;  Location: [facility];  Service: Endoscopy;  Laterality:  N/A;   EYE SURGERY Bilateral    lens replacements   FRACTURE SURGERY Right 2003   fibula   surgery for fibroids  2000   Patient Active Problem List   Diagnosis Date Noted   Primary osteoarthritis of right knee 07/15/2021   Influenza vaccine refused 06/13/2020   Dermatitis 06/13/2020   Mixed hyperlipidemia 06/13/2020   Prediabetes 11/21/2012   Breast fibrocystic disorder 11/21/2012   Chest pain 11/21/2012   Anxiety state, unspecified 11/21/2012   Incidental lung nodule, > 3mm76md < 8mm 36m04/2013    REFERRING DIAG: Primary osteoarthritis of right knee  THERAPY DIAG:  Chronic pain of right knee  Muscle weakness (generalized)  Other abnormalities of gait and mobility  Rationale for Evaluation and Treatment Rehabilitation  PERTINENT HISTORY: Right tibial nail 2003  PRECAUTIONS: None  SUBJECTIVE: Patient reports improvement in symptoms and consistency with exercises. The bridge exercise has caused her to have some back discomfort.  PAIN:  Are you having pain? Yes:  NPRS scale: 0/10 (5/10 with activity) Pain location: Right knee Pain description: Intermittent, stiff, aching Aggravating factors: Standing, housework, yard work Relieving factors: Rest, getting off knee, using ACE wrap, medication  PATIENT  GOALS: Reduce knee pain and stiffness   OBJECTIVE: (objective measures completed at initial evaluation unless otherwise dated) PATIENT SURVEYS:  FOTO 39% functional status   MUSCLE LENGTH: Bilateral quad flexibility deficit   PALPATION: Mildly tender to palpation right knee medial joint line   LOWER EXTREMITY ROM:   Active ROM Right eval Left eval  Knee flexion 117 121  Knee extension 3 0    LOWER EXTREMITY MMT:   MMT Right eval Left eval  Hip flexion 4 4  Hip extension 4- 4-  Hip abduction 4- 4  Knee flexion 4+ 4+  Knee extension 4 4+    FUNCTIONAL TESTS:  5 times sit to stand: 14 seconds  11/15/2021: 10 seconds   GAIT: Assistive device  utilized: None Level of assistance: Complete Independence Comments: Slightly antalgic on right     TODAY'S TREATMENT: [facility] Adult PT Treatment:                                                DATE: 11/15/2021 Therapeutic Exercise: NuStep L6 x 5 min with UE/LE while taking subjective SLR 2 x 10 each Sidelying hip abduction 2 x 10 each Prone straight leg hip extension 2 x 10 each Sit to stand 2 x 10 without UE support Knee extension machine 20# 2 x 10 each Knee flexion machine 20# 2 x 10 each Forward step-up on 8" box 2 x 10 each Heel raises 2 x 20 LAQ with red x 10 each - reviewed for HEP   [facility] Adult PT Treatment:                                                DATE: 11/10/2021 Therapeutic Exercise: Supine hamstring stretch with strap 2 x 30 sec each Prone quad stretch 2 x 30 sec SLR 2 x 10 each Bridge 2 x 10 Clamshell with red 2 x 20 each Leg press 45# 2 x 10 Knee extension machine 20# 2 x 10 each LAQ with red x 10 each  [facility] Adult PT Treatment:                                                DATE: 11/01/2021 Therapeutic Exercise: SLR x 10 each Bridge x 10 Clamshell with red x 15 each Sit to stand x 10   PATIENT EDUCATION:  Education details: HEP update Person educated: Patient Education method: Explanation, Demonstration, Tactile cues, Verbal cues, Handout Education comprehension: verbalized understanding, returned demonstration, verbal cues required, tactile cues required, and needs further education   HOME EXERCISE PROGRAM: Access Code: [email or web address]     ASSESSMENT: CLINICAL IMPRESSION: Patient tolerated therapy well with no adverse effects. Therapy focuses primarily on progression of strengthening with good tolerance. She did report bridge exercise caused low back pain so discontinued that exercises and incorporated other hip strengthening that she tolerated better. She was able to progress standing exercises this visit and demonstrates improvement in LE strength based on  5xSTS time. Reviewed and updated HEP with good tolerance. Patient would benefit from continued skilled PT to progress her mobility and strength in order  to reduce pain and maximize functional ability.      OBJECTIVE IMPAIRMENTS Abnormal gait, decreased activity tolerance, decreased ROM, decreased strength, impaired flexibility, improper body mechanics, and pain.    ACTIVITY LIMITATIONS standing, squatting, sleeping, and locomotion level   PARTICIPATION LIMITATIONS: cleaning, shopping, community activity, and yard work   [place], Past/current experiences, and Time since onset of injury/illness/exacerbation are also affecting patient's functional outcome.      GOALS: Goals reviewed with patient? Yes   SHORT TERM GOALS: Target date: 11/29/2021    Patient will be I with initial HEP in order to progress with therapy. Baseline: HEP provided at eval Goal status: INITIAL   2.  PT will review FOTO with patient by 3rd visit in order to understand expected progress and outcome with therapy. Baseline: FOTO assessed at eval 11/15/2021: reviewed Goal status: MET   3.  Patient will perform 5xSTS in </= 11 seconds to indicate improved LE strength and functional mobility Baseline: 14 seconds 11/15/2021: 10 seconds Goal status: MET   LONG TERM GOALS: Target date: 12/27/2021    Patient will be I with final HEP to maintain progress from PT. Baseline: HEP provided at eval Goal status: INITIAL   2.  Patient will report >/= 58% status on FOTO to indicate improved functional ability. Baseline: 39% functional status Goal status: INITIAL   3.  Patient will demonstrate right knee strength 5/5 MMT and hip strength >/= 4/5 MMT in order to improve standing and walking tolerance. Baseline: patient demonstrates gross knee and hip strength deficits Goal status: INITIAL   4.  Patient will report right knee pain </= 2/10 with activities including walking, yard work, and standing to reduce  functional limitations Baseline: 5/10 pain with activity Goal status: INITIAL     PLAN: PT FREQUENCY: 1x/week   PT DURATION: 8 weeks   PLANNED INTERVENTIONS: Therapeutic exercises, Therapeutic activity, Neuromuscular re-education, Balance training, Gait training, Patient/Family education, Joint manipulation, Joint mobilization, Aquatic Therapy, Dry Needling, Cryotherapy, Moist heat, Taping, Manual therapy, and Re-evaluation   PLAN FOR NEXT SESSION: Review HEP and progress PRN, stretching for quads, progress knee and hip strengthening progressing to standing closed chain exercises, dynamic balance training    Hilda Blades, PT, DPT, LAT, ATC 11/15/21  4:16 PM Phone: [phone] Fax: [phone]

## 2021-11-21 ENCOUNTER — Encounter: Payer: Self-pay | Admitting: Physical Therapy

## 2021-11-21 ENCOUNTER — Ambulatory Visit: Payer: Medicare HMO | Admitting: Physical Therapy

## 2021-11-21 ENCOUNTER — Other Ambulatory Visit: Payer: Self-pay

## 2021-11-21 DIAGNOSIS — M6281 Muscle weakness (generalized): Secondary | ICD-10-CM | POA: Diagnosis not present

## 2021-11-21 DIAGNOSIS — G8929 Other chronic pain: Secondary | ICD-10-CM

## 2021-11-21 DIAGNOSIS — R2689 Other abnormalities of gait and mobility: Secondary | ICD-10-CM

## 2021-11-21 DIAGNOSIS — M25561 Pain in right knee: Secondary | ICD-10-CM | POA: Diagnosis not present

## 2021-11-21 NOTE — Therapy (Signed)
OUTPATIENT PHYSICAL THERAPY TREATMENT NOTE   Patient Name: Emily [name] MRN: [id number] DOB:1950/06/09, 71 y.o., female Today's Date: 11/21/2021  PCP: Ladell Pier, MD   REFERRING PROVIDER: Ladell Pier, MD   END OF SESSION:   PT End of Session - 11/21/21 0835     Visit Number 4    Number of Visits 8    Date for PT Re-Evaluation 12/27/21    Authorization Type [employer] MCR    Authorization Time Period FOTO by 6th, KX by 15th    Progress Note Due on Visit 10    PT Start Time 0830    PT Stop Time 0912    PT Time Calculation (min) 42 min    Activity Tolerance Patient tolerated treatment well    Behavior During Therapy [facility] for tasks assessed/performed               Past Medical History:  Diagnosis Date   Anemia    iron runs low   Anxiety    Anxiety state, unspecified 11/21/2012   Breast fibrocystic disorder 11/21/2012   Bronchitis    Cataract    Chest pain 11/21/2012   Normal stress echocardiogram 11/2010    History of chicken pox    History of uterine fibroid    Incidental lung nodule, > 30m and < 857m10/08/2011   93m38mUL subpleural nodule found on CXR screening for TB due to h/o TB exposure as a child with positive ppd.  Recheck CT scan in 6-12 mos (March - Sept 2014) Repeat CT scan 01/2013 shows: Stable subpleural nodule in the right upper lobe measuring 5 mm. If this patient is at low risk for lung cancer then no further workup is necessary.  If this patient is at increased risk for lung cancer then foll   Nontraumatic tear of skin    right side of vagina skin since 06-12-15, small amount of blood when wipes   Prediabetes 11/21/2012   Tuberculosis    exposed as a young child   Past Surgical History:  Procedure Laterality Date   BREAST BIOPSY Right 05/28/2016   DENSE STROMAL FIBROSIS    COLONOSCOPY WITH PROPOFOL N/A 04/24/2016   Procedure: COLONOSCOPY WITH PROPOFOL;  Surgeon: MarGarlan FairD;  Location: [facility];  Service: Endoscopy;   Laterality: N/A;   EYE SURGERY Bilateral    lens replacements   FRACTURE SURGERY Right 2003   fibula   surgery for fibroids  2000   Patient Active Problem List   Diagnosis Date Noted   Primary osteoarthritis of right knee 07/15/2021   Influenza vaccine refused 06/13/2020   Dermatitis 06/13/2020   Mixed hyperlipidemia 06/13/2020   Prediabetes 11/21/2012   Breast fibrocystic disorder 11/21/2012   Chest pain 11/21/2012   Anxiety state, unspecified 11/21/2012   Incidental lung nodule, > 3mm39md < 8mm 62m04/2013    REFERRING DIAG: Primary osteoarthritis of right knee  THERAPY DIAG:  Chronic pain of right knee  Muscle weakness (generalized)  Other abnormalities of gait and mobility  Rationale for Evaluation and Treatment Rehabilitation  PERTINENT HISTORY: Right tibial nail 2003  PRECAUTIONS: None  SUBJECTIVE: Patient reports she can tell her knee is getting stronger, states she worked in the yard for 2-3 hours yesterday without any issues.  PAIN:  Are you having pain?   NPRS scale: 0/10 (5/10 with activity) Pain location: Right knee Pain description: Intermittent, stiff, aching Aggravating factors: Standing, housework, yard work Relieving factors: Rest, getting off knee, using ACE  wrap, medication  PATIENT GOALS: Reduce knee pain and stiffness   OBJECTIVE: (objective measures completed at initial evaluation unless otherwise dated) PATIENT SURVEYS:  FOTO 39% functional status   MUSCLE LENGTH: Bilateral quad flexibility deficit   PALPATION: Mildly tender to palpation right knee medial joint line   LOWER EXTREMITY ROM:   Active ROM Right eval Left eval Right 11/21/2021  Knee flexion 117 121   Knee extension 3 0 2    LOWER EXTREMITY MMT:   MMT Right eval Left eval  Hip flexion 4 4  Hip extension 4- 4-  Hip abduction 4- 4  Knee flexion 4+ 4+  Knee extension 4 4+    FUNCTIONAL TESTS:  5 times sit to stand: 14 seconds  11/15/2021: 10 seconds    GAIT: Assistive device utilized: None Level of assistance: Complete Independence Comments: Slightly antalgic on right     TODAY'S TREATMENT: [facility] Adult PT Treatment:                                                DATE: 11/21/2021 Therapeutic Exercise: NuStep L6 x 5 min with UE/LE while taking subjective Slant board calf stretch 3 x 30 sec Seated hamstring stretch 3 x 30 sec SLR x 15 (right) Sidelying hip abduction 2 x 15 (right) Sit to stand 3 x 10 holding 10# weight Heel raises 2 x 20 Forward step-up on 8" box 2 x 10 each Rockerboard forward/backward taps 2 x 2 min Knee extension machine 25# 2 x 10 each Knee flexion machine 25# 2 x 10 each Manual: Patellar mobs superior/inferior to improve knee extension  [facility] Adult PT Treatment:                                                DATE: 11/15/2021 Therapeutic Exercise: NuStep L6 x 5 min with UE/LE while taking subjective SLR 2 x 10 each Sidelying hip abduction 2 x 10 each Prone straight leg hip extension 2 x 10 each Sit to stand 2 x 10 without UE support Knee extension machine 20# 2 x 10 each Knee flexion machine 20# 2 x 10 each Forward step-up on 8" box 2 x 10 each Heel raises 2 x 20 LAQ with red x 10 each - reviewed for HEP  [facility] Adult PT Treatment:                                                DATE: 11/10/2021 Therapeutic Exercise: Supine hamstring stretch with strap 2 x 30 sec each Prone quad stretch 2 x 30 sec SLR 2 x 10 each Bridge 2 x 10 Clamshell with red 2 x 20 each Leg press 45# 2 x 10 Knee extension machine 20# 2 x 10 each LAQ with red x 10 each   PATIENT EDUCATION:  Education details: HEP update Person educated: Patient Education method: Explanation, Demonstration, Tactile cues, Verbal cues, Handout Education comprehension: verbalized understanding, returned demonstration, verbal cues required, tactile cues required, and needs further education   HOME EXERCISE PROGRAM: Access Code: [email or web address]      ASSESSMENT: CLINICAL IMPRESSION: Patient tolerated  therapy well with no adverse effects. She does continue to exhibit slight knee extension deficit so incorporated some patellar mobs and stretching with good tolerance. Right knee extension has improved since evaluation. Therapy focused primarily on continued strength progression and incorporating standing stability exercises with good tolerance. She seems to be progressing well with her strengthening and reports increase in activity level with less pain and limitation. No changes to HEP this visit. Patient would benefit from continued skilled PT to progress her mobility and strength in order to reduce pain and maximize functional ability.      OBJECTIVE IMPAIRMENTS Abnormal gait, decreased activity tolerance, decreased ROM, decreased strength, impaired flexibility, improper body mechanics, and pain.    ACTIVITY LIMITATIONS standing, squatting, sleeping, and locomotion level   PARTICIPATION LIMITATIONS: cleaning, shopping, community activity, and yard work   [employer], Past/current experiences, and Time since onset of injury/illness/exacerbation are also affecting patient's functional outcome.      GOALS: Goals reviewed with patient? Yes   SHORT TERM GOALS: Target date: 11/29/2021    Patient will be I with initial HEP in order to progress with therapy. Baseline: HEP provided at eval Goal status: INITIAL   2.  PT will review FOTO with patient by 3rd visit in order to understand expected progress and outcome with therapy. Baseline: FOTO assessed at eval 11/15/2021: reviewed Goal status: MET   3.  Patient will perform 5xSTS in </= 11 seconds to indicate improved LE strength and functional mobility Baseline: 14 seconds 11/15/2021: 10 seconds Goal status: MET   LONG TERM GOALS: Target date: 12/27/2021    Patient will be I with final HEP to maintain progress from PT. Baseline: HEP provided at eval Goal status: INITIAL   2.   Patient will report >/= 58% status on FOTO to indicate improved functional ability. Baseline: 39% functional status Goal status: INITIAL   3.  Patient will demonstrate right knee strength 5/5 MMT and hip strength >/= 4/5 MMT in order to improve standing and walking tolerance. Baseline: patient demonstrates gross knee and hip strength deficits Goal status: INITIAL   4.  Patient will report right knee pain </= 2/10 with activities including walking, yard work, and standing to reduce functional limitations Baseline: 5/10 pain with activity Goal status: INITIAL     PLAN: PT FREQUENCY: 1x/week   PT DURATION: 8 weeks   PLANNED INTERVENTIONS: Therapeutic exercises, Therapeutic activity, Neuromuscular re-education, Balance training, Gait training, Patient/Family education, Joint manipulation, Joint mobilization, Aquatic Therapy, Dry Needling, Cryotherapy, Moist heat, Taping, Manual therapy, and Re-evaluation   PLAN FOR NEXT SESSION: Review HEP and progress PRN, stretching for quads, progress knee and hip strengthening progressing to standing closed chain exercises, dynamic balance training    Hilda Blades, PT, DPT, LAT, ATC 11/21/21  9:13 AM Phone: [phone] Fax: [phone]

## 2021-11-21 NOTE — Patient Instructions (Signed)
Schedule 1 more visits with Megan Salon, 1x/week

## 2021-11-27 NOTE — Therapy (Signed)
OUTPATIENT PHYSICAL THERAPY TREATMENT NOTE   Patient Name: Emily [name] MRN: [id number] DOB:04/16/1951, 71 y.o., female Today's Date: 11/28/2021  PCP: Ladell Pier, MD   REFERRING PROVIDER: Ladell Pier, MD   END OF SESSION:   PT End of Session - 11/28/21 0824     Visit Number 5    Number of Visits 8    Date for PT Re-Evaluation 12/27/21    Authorization Type [employer] MCR    Authorization Time Period FOTO by 6th, KX by 15th    Progress Note Due on Visit 10    PT Start Time 0830    PT Stop Time 0910    PT Time Calculation (min) 40 min    Activity Tolerance Patient tolerated treatment well    Behavior During Therapy [facility] for tasks assessed/performed                Past Medical History:  Diagnosis Date   Anemia    iron runs low   Anxiety    Anxiety state, unspecified 11/21/2012   Breast fibrocystic disorder 11/21/2012   Bronchitis    Cataract    Chest pain 11/21/2012   Normal stress echocardiogram 11/2010    History of chicken pox    History of uterine fibroid    Incidental lung nodule, > 51m and < 873m10/08/2011   31m24mUL subpleural nodule found on CXR screening for TB due to h/o TB exposure as a child with positive ppd.  Recheck CT scan in 6-12 mos (March - Sept 2014) Repeat CT scan 01/2013 shows: Stable subpleural nodule in the right upper lobe measuring 5 mm. If this patient is at low risk for lung cancer then no further workup is necessary.  If this patient is at increased risk for lung cancer then foll   Nontraumatic tear of skin    right side of vagina skin since 06-12-15, small amount of blood when wipes   Prediabetes 11/21/2012   Tuberculosis    exposed as a young child   Past Surgical History:  Procedure Laterality Date   BREAST BIOPSY Right 05/28/2016   DENSE STROMAL FIBROSIS    COLONOSCOPY WITH PROPOFOL N/A 04/24/2016   Procedure: COLONOSCOPY WITH PROPOFOL;  Surgeon: MarGarlan FairD;  Location: [facility];  Service: Endoscopy;   Laterality: N/A;   EYE SURGERY Bilateral    lens replacements   FRACTURE SURGERY Right 2003   fibula   surgery for fibroids  2000   Patient Active Problem List   Diagnosis Date Noted   Primary osteoarthritis of right knee 07/15/2021   Influenza vaccine refused 06/13/2020   Dermatitis 06/13/2020   Mixed hyperlipidemia 06/13/2020   Prediabetes 11/21/2012   Breast fibrocystic disorder 11/21/2012   Chest pain 11/21/2012   Anxiety state, unspecified 11/21/2012   Incidental lung nodule, > 3mm4md < 8mm 47m04/2013    REFERRING DIAG: Primary osteoarthritis of right knee  THERAPY DIAG:  Chronic pain of right knee  Muscle weakness (generalized)  Other abnormalities of gait and mobility  Rationale for Evaluation and Treatment Rehabilitation  PERTINENT HISTORY: Right tibial nail 2003  PRECAUTIONS: None  SUBJECTIVE: Patient reports she is doing well. She has been continuing to do her yard work and states her knees are doing really good.  PAIN:  Are you having pain?   NPRS scale: 0/10 (3/10 with activity) Pain location: Right knee Pain description: Intermittent, stiff, aching Aggravating factors: Standing, housework, yard work Relieving factors: Rest, getting off knee, using  ACE wrap, medication  PATIENT GOALS: Reduce knee pain and stiffness   OBJECTIVE: (objective measures completed at initial evaluation unless otherwise dated) PATIENT SURVEYS:  FOTO 39% functional status  11/28/2021: 63%   MUSCLE LENGTH: Bilateral quad flexibility deficit   PALPATION: Mildly tender to palpation right knee medial joint line   LOWER EXTREMITY ROM:   Active ROM Right eval Left eval Right 11/21/2021  Knee flexion 117 121   Knee extension 3 0 2    LOWER EXTREMITY MMT:   MMT Right eval Left eval  Hip flexion 4 4  Hip extension 4- 4-  Hip abduction 4- 4  Knee flexion 4+ 4+  Knee extension 4 4+    FUNCTIONAL TESTS:  5 times sit to stand: 14 seconds  11/15/2021: 10 seconds    GAIT: Assistive device utilized: None Level of assistance: Complete Independence Comments: Slightly antalgic on right     TODAY'S TREATMENT: [facility] Adult PT Treatment:                                                DATE: 11/28/2021 Therapeutic Exercise: NuStep L6 x 5 min with UE/LE while taking subjective Slant board calf stretch 3 x 30 sec Seated hamstring stretch 3 x 30 sec each Sit to stand 3 x 10 holding 15# weight Standing hip abduction machine 12.5# 2 x 15 each Heel raises 2 x 20 Forward step-up on 8" box 2 x 10 each Lateral step-up and over on 6" box 2 x 10 SLS 3 x 30 sec each Knee extension machine 25# 2 x 15 each Knee flexion machine 25# 2 x 15 each   [facility] Adult PT Treatment:                                                DATE: 11/21/2021 Therapeutic Exercise: NuStep L6 x 5 min with UE/LE while taking subjective Slant board calf stretch 3 x 30 sec Seated hamstring stretch 3 x 30 sec SLR x 15 (right) Sidelying hip abduction 2 x 15 (right) Sit to stand 3 x 10 holding 10# weight Heel raises 2 x 20 Forward step-up on 8" box 2 x 10 each Rockerboard forward/backward taps 2 x 2 min Knee extension machine 25# 2 x 10 each Knee flexion machine 25# 2 x 10 each Manual: Patellar mobs superior/inferior to improve knee extension  [facility] Adult PT Treatment:                                                DATE: 11/15/2021 Therapeutic Exercise: NuStep L6 x 5 min with UE/LE while taking subjective SLR 2 x 10 each Sidelying hip abduction 2 x 10 each Prone straight leg hip extension 2 x 10 each Sit to stand 2 x 10 without UE support Knee extension machine 20# 2 x 10 each Knee flexion machine 20# 2 x 10 each Forward step-up on 8" box 2 x 10 each Heel raises 2 x 20 LAQ with red x 10 each - reviewed for HEP   PATIENT EDUCATION:  Education details: HEP, FOTO Person educated: Patient Education  method: Explanation, Demonstration, Tactile cues, Verbal cues Education comprehension:  verbalized understanding, returned demonstration, verbal cues required, tactile cues required, and needs further education   HOME EXERCISE PROGRAM: Access Code: [email or web address]     ASSESSMENT: CLINICAL IMPRESSION: Patient tolerated therapy well with no adverse effects. She seems to be improving and reporting improved functional ability on FOTO. Therapy continues to focus on progression of hip and knee strengthening, and progressing standing stability exercises with good tolerance. No changes to HEP this visit. Patient would benefit from continued skilled PT to progress her mobility and strength in order to reduce pain and maximize functional ability.      OBJECTIVE IMPAIRMENTS Abnormal gait, decreased activity tolerance, decreased ROM, decreased strength, impaired flexibility, improper body mechanics, and pain.    ACTIVITY LIMITATIONS standing, squatting, sleeping, and locomotion level   PARTICIPATION LIMITATIONS: cleaning, shopping, community activity, and yard work   [place], Past/current experiences, and Time since onset of injury/illness/exacerbation are also affecting patient's functional outcome.      GOALS: Goals reviewed with patient? Yes   SHORT TERM GOALS: Target date: 11/29/2021    Patient will be I with initial HEP in order to progress with therapy. Baseline: HEP provided at eval 11/28/2021: independent  Goal status: MET   2.  PT will review FOTO with patient by 3rd visit in order to understand expected progress and outcome with therapy. Baseline: FOTO assessed at eval 11/15/2021: reviewed Goal status: MET   3.  Patient will perform 5xSTS in </= 11 seconds to indicate improved LE strength and functional mobility Baseline: 14 seconds 11/15/2021: 10 seconds Goal status: MET   LONG TERM GOALS: Target date: 12/27/2021    Patient will be I with final HEP to maintain progress from PT. Baseline: HEP provided at eval Goal status: INITIAL   2.  Patient will report  >/= 58% status on FOTO to indicate improved functional ability. Baseline: 39% functional status 11/28/2021: 63% Goal status: MET   3.  Patient will demonstrate right knee strength 5/5 MMT and hip strength >/= 4/5 MMT in order to improve standing and walking tolerance. Baseline: patient demonstrates gross knee and hip strength deficits Goal status: INITIAL   4.  Patient will report right knee pain </= 2/10 with activities including walking, yard work, and standing to reduce functional limitations Baseline: 5/10 pain with activity Goal status: INITIAL     PLAN: PT FREQUENCY: 1x/week   PT DURATION: 8 weeks   PLANNED INTERVENTIONS: Therapeutic exercises, Therapeutic activity, Neuromuscular re-education, Balance training, Gait training, Patient/Family education, Joint manipulation, Joint mobilization, Aquatic Therapy, Dry Needling, Cryotherapy, Moist heat, Taping, Manual therapy, and Re-evaluation   PLAN FOR NEXT SESSION: Review HEP and progress PRN, stretching for quads, progress knee and hip strengthening progressing to standing closed chain exercises, dynamic balance training    Hilda Blades, PT, DPT, LAT, ATC 11/28/21  9:17 AM Phone: [phone] Fax: [phone]

## 2021-11-28 ENCOUNTER — Encounter: Payer: Self-pay | Admitting: Physical Therapy

## 2021-11-28 ENCOUNTER — Ambulatory Visit: Payer: Medicare HMO | Attending: Internal Medicine | Admitting: Internal Medicine

## 2021-11-28 ENCOUNTER — Ambulatory Visit: Payer: Medicare HMO | Admitting: Physical Therapy

## 2021-11-28 ENCOUNTER — Encounter: Payer: Self-pay | Admitting: Internal Medicine

## 2021-11-28 ENCOUNTER — Other Ambulatory Visit: Payer: Self-pay

## 2021-11-28 VITALS — BP 105/68 | HR 92 | Temp 98.2°F | Resp 16 | Wt 145.0 lb

## 2021-11-28 DIAGNOSIS — M1711 Unilateral primary osteoarthritis, right knee: Secondary | ICD-10-CM

## 2021-11-28 DIAGNOSIS — R7303 Prediabetes: Secondary | ICD-10-CM | POA: Diagnosis not present

## 2021-11-28 DIAGNOSIS — M6281 Muscle weakness (generalized): Secondary | ICD-10-CM | POA: Diagnosis not present

## 2021-11-28 DIAGNOSIS — D508 Other iron deficiency anemias: Secondary | ICD-10-CM

## 2021-11-28 DIAGNOSIS — G500 Trigeminal neuralgia: Secondary | ICD-10-CM

## 2021-11-28 DIAGNOSIS — E782 Mixed hyperlipidemia: Secondary | ICD-10-CM | POA: Diagnosis not present

## 2021-11-28 DIAGNOSIS — G8929 Other chronic pain: Secondary | ICD-10-CM | POA: Diagnosis not present

## 2021-11-28 DIAGNOSIS — M25561 Pain in right knee: Secondary | ICD-10-CM | POA: Diagnosis not present

## 2021-11-28 DIAGNOSIS — R2689 Other abnormalities of gait and mobility: Secondary | ICD-10-CM

## 2021-11-28 MED ORDER — ATORVASTATIN CALCIUM 10 MG PO TABS: 10.0000 mg | ORAL_TABLET | Freq: Every day | ORAL | 1 refills | Status: DC

## 2021-11-28 NOTE — Progress Notes (Signed)
Patient ID: Emily [name], female    DOB: 02/03/1951  MRN: [id number]  CC: Follow-up, Facial Pain, and screening for diabetes   Subjective: Emily [name] is a 71 y.o. female who presents for chronic ds management Her concerns today include:  Patient with history of prediabetes, anxiety, fibrocystic breast, HL, iron deficiency, COVID-19 infection 02/2020, TMJ  PreDM/HL:  reports compliance with Lipitor but last rxn was written 05/2021 for 90 day supply without refill I asked about this.  She then stated she takes it but not every day.  Denies any muscle aches or cramps from the medication. -reports healthy eating habits: lots fruits and veggies.  Drinks sodas occasionally, cranberry juice and water.   Going to P.T once a wk for RT knee OA.  Moving better since P.T.  Saw Dr. Erlinda Hong, declined inj to knee No falls Sleeping well   C/o intermittent RT facial pain near the ear that radiates to top of head. Notice it comes on with changes in temp, noise and stress.  No pain with chewing.  Saw her dentist and told it is not from her tooth. Can last for days.  Does not occur often.  Occurred once in past 6 mths.  Not HA. Usually she lays down and stay quiet.   Taking iron supplement but not every day. Takes for 3 wks at a time if she feels tired and low energy.  Moving bowels okay.  No blood in the stools.  Denies any dizziness at this time.  Patient Active Problem List   Diagnosis Date Noted   Primary osteoarthritis of right knee 07/15/2021   Influenza vaccine refused 06/13/2020   Dermatitis 06/13/2020   Mixed hyperlipidemia 06/13/2020   Prediabetes 11/21/2012   Breast fibrocystic disorder 11/21/2012   Chest pain 11/21/2012   Anxiety state, unspecified 11/21/2012   Incidental lung nodule, > 70m and < 857m10/08/2011     Current Outpatient Medications on File Prior to Visit  Medication Sig Dispense Refill   atorvastatin (LIPITOR) 10 MG tablet TAKE 1 TABLET BY MOUTH EVERY DAY 90 tablet 0    cholecalciferol (VITAMIN D3) 25 MCG (1000 UNIT) tablet Take 1,000 Units by mouth daily.     cyanocobalamin (CYANOCOBALAMIN) 500 MCG tablet Take 500 mcg by mouth daily.     ferrous sulfate 325 (65 FE) MG tablet Take 325 mg by mouth daily with breakfast.     glucosamine-chondroitin 500-400 MG tablet Take 1 tablet by mouth 3 (three) times daily.     Ascorbic Acid (VITAMIN C) 100 MG tablet Take 100 mg by mouth daily. (Patient not taking: Reported on 11/28/2021)     No current facility-administered medications on file prior to visit.    Allergies  Allergen Reactions   Latex Itching and Rash    Social History   Socioeconomic History   Marital status: Divorced    Spouse name: Not on file   Number of children: 2   Years of education: Not on file   Highest education level: Some college, no degree  Occupational History   Occupation: Day-care  Tobacco Use   Smoking status: Never   Smokeless tobacco: Never  Vaping Use   Vaping Use: Never used  Substance and Sexual Activity   Alcohol use: No   Drug use: No   Sexual activity: Never  Other Topics Concern   Not on file  Social History Narrative   Not on file   Social Determinants of Health   Financial Resource Strain: Not  on file  Food Insecurity: Not on file  Transportation Needs: Not on file  Physical Activity: Not on file  Stress: Not on file  Social Connections: Not on file  Intimate Partner Violence: Not on file    Family History  Problem Relation Age of Onset   Diabetes Brother    Hypertension Brother    Alcohol abuse Brother    Mental illness Brother        nervous breakdown   Stroke Maternal Grandmother    Depression Paternal Grandmother    Arthritis Mother    COPD Father 60       decsd due to lack of oxygen   Hypertension Father    Alcohol abuse Father    Gout Father    Alcohol abuse Brother    Hypertension Brother     Past Surgical History:  Procedure Laterality Date   BREAST BIOPSY Right 05/28/2016    DENSE STROMAL FIBROSIS    COLONOSCOPY WITH PROPOFOL N/A 04/24/2016   Procedure: COLONOSCOPY WITH PROPOFOL;  Surgeon: Garlan Fair, MD;  Location: [facility];  Service: Endoscopy;  Laterality: N/A;   EYE SURGERY Bilateral    lens replacements   FRACTURE SURGERY Right 2003   fibula   surgery for fibroids  2000    ROS: Review of Systems Negative except as stated above  PHYSICAL EXAM: BP 105/68 (BP Location: Left Arm, Patient Position: Sitting, Cuff Size: Normal)   Pulse 92   Temp 98.2 F (36.8 C) (Oral)   Resp 16   Wt 145 lb (65.8 kg)   SpO2 98%   BMI 25.69 kg/m   Wt Readings from Last 3 Encounters:  11/28/21 145 lb (65.8 kg)  05/02/21 145 lb (65.8 kg)  01/27/21 146 lb 12.8 oz (66.6 kg)    Physical Exam  General appearance - alert, well appearing, and in no distress Mental status - normal mood, behavior, speech, dress, motor activity, and thought processes Ears - bilateral TM's and external ear canals normal Mouth - mucous membranes moist, pharynx normal without lesions Neck - supple, no significant adenopathy Chest - clear to auscultation, no wheezes, rales or rhonchi, symmetric air entry Heart - normal rate, regular rhythm, normal S1, S2, no murmurs, rubs, clicks or gallops Musculoskeletal -right knee: Mild joint enlargement.  No point tenderness.  Good range of motion.  No crepitus.  She ambulates without assistive device. Extremities - peripheral pulses normal, no pedal edema, no clubbing or cyanosis      Latest Ref Rng & Units 06/13/2020    9:30 AM 05/06/2018   11:02 AM 07/20/2017    3:04 PM  CMP  Glucose 65 - 99 mg/dL 99  94  89   BUN 8 - 27 mg/dL '23  19  18   '$ Creatinine 0.57 - 1.00 mg/dL 0.76  0.59  0.70   Sodium 134 - 144 mmol/L 144  138  140   Potassium 3.5 - 5.2 mmol/L 4.2  3.8  3.9   Chloride 96 - 106 mmol/L 103  102  101   CO2 20 - 29 mmol/L '24  24  26   '$ Calcium 8.7 - 10.3 mg/dL 9.3  9.3  9.2   Total Protein 6.0 - 8.5 g/dL 7.6  6.6  7.0   Total  Bilirubin 0.0 - 1.2 mg/dL 0.2  0.4  <0.2   Alkaline Phos 44 - 121 IU/L 72  56  57   AST 0 - 40 IU/L 22  15  23  ALT 0 - 32 IU/L '18  18  19    '$ Lipid Panel     Component Value Date/Time   CHOL 198 06/13/2020 0930   TRIG 45 06/13/2020 0930   HDL 99 06/13/2020 0930   CHOLHDL 2.0 06/13/2020 0930   CHOLHDL 2.1 02/23/2016 0918   VLDL 8 02/23/2016 0918   LDLCALC 90 06/13/2020 0930    CBC    Component Value Date/Time   WBC 4.7 06/13/2020 0930   WBC 4.4 (A) 07/20/2017 1455   WBC 4.4 08/18/2015 1050   RBC 4.63 06/13/2020 0930   RBC 4.43 07/20/2017 1455   RBC 4.29 08/18/2015 1050   HGB 12.5 06/13/2020 0930   HCT 39.1 06/13/2020 0930   PLT 269 06/13/2020 0930   MCV 84 06/13/2020 0930   MCH 27.0 06/13/2020 0930   MCH 26.5 (A) 07/20/2017 1455   MCH 27.3 08/18/2015 1050   MCHC 32.0 06/13/2020 0930   MCHC 32.0 07/20/2017 1455   MCHC 31.8 (L) 08/18/2015 1050   RDW 13.1 06/13/2020 0930   LYMPHSABS 1,496 08/18/2015 1050   MONOABS 484 08/18/2015 1050   EOSABS 88 08/18/2015 1050   BASOSABS 0 08/18/2015 1050    ASSESSMENT AND PLAN:  1. Mixed hyperlipidemia Continue Lipitor.  Refill sent. - Comprehensive metabolic panel - Lipid panel  2. Trigeminal neuralgia Symptoms suggest possible trigeminal neuralgia.  We discussed trying her with carbamazepine if symptoms were occurring more often.  However since symptoms occur only occasionally, I told her it is not worth putting her on carbamazepine given the potential side effects.  She is in agreement with this.  3. Primary osteoarthritis of right knee Doing well with physical therapy.  Moving better.  Recommend use of Tylenol as needed.  4. Prediabetes Continue healthy eating habits.  Try to move as much as she can. - Hemoglobin A1c  5. Other iron deficiency anemia We will recheck CBC to see where she is in terms of the anemia. - CBC    Patient was given the opportunity to ask questions.  Patient verbalized understanding of the  plan and was able to repeat key elements of the plan.   This documentation was completed using Radio producer.  Any transcriptional errors are unintentional.  No orders of the defined types were placed in this encounter.    Requested Prescriptions    No prescriptions requested or ordered in this encounter    No follow-ups on file.  Karle Plumber, MD, FACP

## 2021-11-28 NOTE — Progress Notes (Signed)
Concerns about an intermittent facial pain radiates to teeth and top of head.   A1C screening for DM

## 2021-11-29 LAB — LIPID PANEL
Chol/HDL Ratio: 2 ratio (ref 0.0–4.4)
Cholesterol, Total: 170 mg/dL (ref 100–199)
HDL: 87 mg/dL (ref >39)
LDL Chol Calc (NIH): 70 mg/dL (ref 0–99)
Triglycerides: 66 mg/dL (ref 0–149)
VLDL Cholesterol Cal: 13 mg/dL (ref 5–40)

## 2021-11-29 LAB — CBC
Hematocrit: 36.1 % (ref 34.0–46.6)
Hemoglobin: 11.8 g/dL (ref 11.1–15.9)
MCH: 27.3 pg (ref 26.6–33.0)
MCHC: 32.7 g/dL (ref 31.5–35.7)
MCV: 84 fL (ref 79–97)
Platelets: 219 10*3/uL (ref 150–450)
RBC: 4.32 x10E6/uL (ref 3.77–5.28)
RDW: 13.2 % (ref 11.7–15.4)
WBC: 5.6 10*3/uL (ref 3.4–10.8)

## 2021-11-29 LAB — COMPREHENSIVE METABOLIC PANEL
ALT: 18 IU/L (ref 0–32)
AST: 21 IU/L (ref 0–40)
Albumin/Globulin Ratio: 1.5 (ref 1.2–2.2)
Albumin: 4.1 g/dL (ref 3.8–4.8)
Alkaline Phosphatase: 68 IU/L (ref 44–121)
BUN/Creatinine Ratio: 24 (ref 12–28)
BUN: 16 mg/dL (ref 8–27)
Bilirubin Total: <0.2 mg/dL (ref 0.0–1.2)
CO2: 24 mmol/L (ref 20–29)
Calcium: 9.4 mg/dL (ref 8.7–10.3)
Chloride: 105 mmol/L (ref 96–106)
Creatinine, Ser: 0.66 mg/dL (ref 0.57–1.00)
Globulin, Total: 2.8 g/dL (ref 1.5–4.5)
Glucose: 122 mg/dL — ABNORMAL HIGH (ref 70–99)
Potassium: 4 mmol/L (ref 3.5–5.2)
Sodium: 143 mmol/L (ref 134–144)
Total Protein: 6.9 g/dL (ref 6.0–8.5)
eGFR: 93 mL/min/{1.73_m2} (ref >59)

## 2021-11-29 LAB — HEMOGLOBIN A1C
Est. average glucose Bld gHb Est-mCnc: 131 mg/dL
Hgb A1c MFr Bld: 6.2 % — ABNORMAL HIGH (ref 4.8–5.6)

## 2021-12-04 NOTE — Therapy (Signed)
OUTPATIENT PHYSICAL THERAPY TREATMENT NOTE   Patient Name: Emily [name] MRN: [id number] DOB:1950/08/15, 71 y.o., female Today's Date: 12/05/2021  PCP: Ladell Pier, MD   REFERRING PROVIDER: Ladell Pier, MD   END OF SESSION:   PT End of Session - 12/05/21 0841     Visit Number 6    Number of Visits 8    Date for PT Re-Evaluation 12/27/21    Authorization Type [employer] MCR    Authorization Time Period FOTO by 6th, KX by 15th    Progress Note Due on Visit 10    PT Start Time 0830    PT Stop Time 0910    PT Time Calculation (min) 40 min    Activity Tolerance Patient tolerated treatment well    Behavior During Therapy [facility] for tasks assessed/performed                 Past Medical History:  Diagnosis Date   Anemia    iron runs low   Anxiety    Anxiety state, unspecified 11/21/2012   Breast fibrocystic disorder 11/21/2012   Bronchitis    Cataract    Chest pain 11/21/2012   Normal stress echocardiogram 11/2010    History of chicken pox    History of uterine fibroid    Incidental lung nodule, > 69m and < 869m10/08/2011   9m58mUL subpleural nodule found on CXR screening for TB due to h/o TB exposure as a child with positive ppd.  Recheck CT scan in 6-12 mos (March - Sept 2014) Repeat CT scan 01/2013 shows: Stable subpleural nodule in the right upper lobe measuring 5 mm. If this patient is at low risk for lung cancer then no further workup is necessary.  If this patient is at increased risk for lung cancer then foll   Nontraumatic tear of skin    right side of vagina skin since 06-12-15, small amount of blood when wipes   Prediabetes 11/21/2012   Tuberculosis    exposed as a young child   Past Surgical History:  Procedure Laterality Date   BREAST BIOPSY Right 05/28/2016   DENSE STROMAL FIBROSIS    COLONOSCOPY WITH PROPOFOL N/A 04/24/2016   Procedure: COLONOSCOPY WITH PROPOFOL;  Surgeon: MarGarlan FairD;  Location: [facility];  Service: Endoscopy;   Laterality: N/A;   EYE SURGERY Bilateral    lens replacements   FRACTURE SURGERY Right 2003   fibula   surgery for fibroids  2000   Patient Active Problem List   Diagnosis Date Noted   Primary osteoarthritis of right knee 07/15/2021   Influenza vaccine refused 06/13/2020   Dermatitis 06/13/2020   Mixed hyperlipidemia 06/13/2020   Prediabetes 11/21/2012   Breast fibrocystic disorder 11/21/2012   Chest pain 11/21/2012   Anxiety state, unspecified 11/21/2012   Incidental lung nodule, > 3mm43md < 8mm 27m04/2013    REFERRING DIAG: Primary osteoarthritis of right knee  THERAPY DIAG:  Chronic pain of right knee  Muscle weakness (generalized)  Other abnormalities of gait and mobility  Rationale for Evaluation and Treatment Rehabilitation  PERTINENT HISTORY: Right tibial nail 2003  PRECAUTIONS: None  SUBJECTIVE: Patient reports she her knee is feeling so much better. She is able to go up/down stairs reciprocally and she feels much stronger.  PAIN:  Are you having pain?   NPRS scale: 0/10 (3/10 with activity) Pain location: Right knee Pain description: Intermittent, stiff, aching Aggravating factors: Standing, housework, yard work Relieving factors: Rest, getting off knee,  using ACE wrap, medication  PATIENT GOALS: Reduce knee pain and stiffness   OBJECTIVE: (objective measures completed at initial evaluation unless otherwise dated) PATIENT SURVEYS:  FOTO 39% functional status  11/28/2021: 63%   MUSCLE LENGTH: Bilateral quad flexibility deficit   PALPATION: Mildly tender to palpation right knee medial joint line   LOWER EXTREMITY ROM:   Active ROM Right eval Left eval Right 11/21/2021  Knee flexion 117 121   Knee extension 3 0 2    LOWER EXTREMITY MMT:   MMT Right eval Left eval  Hip flexion 4 4  Hip extension 4- 4-  Hip abduction 4- 4  Knee flexion 4+ 4+  Knee extension 4 4+    FUNCTIONAL TESTS:  5 times sit to stand: 14 seconds  11/15/2021: 10  seconds   GAIT: Assistive device utilized: None Level of assistance: Complete Independence Comments: Slightly antalgic on right     TODAY'S TREATMENT: [facility] Adult PT Treatment:                                                DATE: 12/05/2021 Therapeutic Exercise: NuStep L6 x 5 min with UE/LE while taking subjective Slant board calf stretch 3 x 30 sec Seated hamstring stretch 2 x 30 sec each Prone quad stretch 3 x 30 sec Leg press (cybex) 60# 3 x 10 Standing hip abduction machine 22.5# 2 x 15 each SLS 3 x 30 sec each Forward step-up on 8" box 2 x 10 each Lateral step-up and over on 8" box 2 x 10 Knee extension machine 25# 2 x 15   [facility] Adult PT Treatment:                                                DATE: 11/28/2021 Therapeutic Exercise: NuStep L6 x 5 min with UE/LE while taking subjective Slant board calf stretch 3 x 30 sec Seated hamstring stretch 3 x 30 sec each Sit to stand 3 x 10 holding 15# weight Standing hip abduction machine 12.5# 2 x 15 each Heel raises 2 x 20 Forward step-up on 8" box 2 x 10 each Lateral step-up and over on 6" box 2 x 10 SLS 3 x 30 sec each Knee extension machine 25# 2 x 15 each Knee flexion machine 25# 2 x 15 each  [facility] Adult PT Treatment:                                                DATE: 11/21/2021 Therapeutic Exercise: NuStep L6 x 5 min with UE/LE while taking subjective Slant board calf stretch 3 x 30 sec Seated hamstring stretch 3 x 30 sec SLR x 15 (right) Sidelying hip abduction 2 x 15 (right) Sit to stand 3 x 10 holding 10# weight Heel raises 2 x 20 Forward step-up on 8" box 2 x 10 each Rockerboard forward/backward taps 2 x 2 min Knee extension machine 25# 2 x 10 each Knee flexion machine 25# 2 x 10 each Manual: Patellar mobs superior/inferior to improve knee extension   PATIENT EDUCATION:  Education details: HEP Person educated: Patient  Education method: Explanation, Demonstration, Tactile cues, Verbal cues Education  comprehension: verbalized understanding, returned demonstration, verbal cues required, tactile cues required, and needs further education   HOME EXERCISE PROGRAM: Access Code: [email or web address]     ASSESSMENT: CLINICAL IMPRESSION: Patient tolerated therapy well with no adverse effects. Therapy continues to focus primarily on strengthening and she is making great progress. She is tolerating increased resistance with exercises and reports feeling stronger and improved ability to negotiate stairs. No changes to HEP this visit. Patient would benefit from continued skilled PT to progress her mobility and strength in order to reduce pain and maximize functional ability.      OBJECTIVE IMPAIRMENTS Abnormal gait, decreased activity tolerance, decreased ROM, decreased strength, impaired flexibility, improper body mechanics, and pain.    ACTIVITY LIMITATIONS standing, squatting, sleeping, and locomotion level   PARTICIPATION LIMITATIONS: cleaning, shopping, community activity, and yard work   [place], Past/current experiences, and Time since onset of injury/illness/exacerbation are also affecting patient's functional outcome.      GOALS: Goals reviewed with patient? Yes   SHORT TERM GOALS: Target date: 11/29/2021    Patient will be I with initial HEP in order to progress with therapy. Baseline: HEP provided at eval 11/28/2021: independent  Goal status: MET   2.  PT will review FOTO with patient by 3rd visit in order to understand expected progress and outcome with therapy. Baseline: FOTO assessed at eval 11/15/2021: reviewed Goal status: MET   3.  Patient will perform 5xSTS in </= 11 seconds to indicate improved LE strength and functional mobility Baseline: 14 seconds 11/15/2021: 10 seconds Goal status: MET   LONG TERM GOALS: Target date: 12/27/2021    Patient will be I with final HEP to maintain progress from PT. Baseline: HEP provided at eval Goal status: INITIAL   2.  Patient  will report >/= 58% status on FOTO to indicate improved functional ability. Baseline: 39% functional status 11/28/2021: 63% Goal status: MET   3.  Patient will demonstrate right knee strength 5/5 MMT and hip strength >/= 4/5 MMT in order to improve standing and walking tolerance. Baseline: patient demonstrates gross knee and hip strength deficits Goal status: INITIAL   4.  Patient will report right knee pain </= 2/10 with activities including walking, yard work, and standing to reduce functional limitations Baseline: 5/10 pain with activity Goal status: INITIAL     PLAN: PT FREQUENCY: 1x/week   PT DURATION: 8 weeks   PLANNED INTERVENTIONS: Therapeutic exercises, Therapeutic activity, Neuromuscular re-education, Balance training, Gait training, Patient/Family education, Joint manipulation, Joint mobilization, Aquatic Therapy, Dry Needling, Cryotherapy, Moist heat, Taping, Manual therapy, and Re-evaluation   PLAN FOR NEXT SESSION: Review HEP and progress PRN, stretching for quads, progress knee and hip strengthening progressing to standing closed chain exercises, dynamic balance training    Hilda Blades, PT, DPT, LAT, ATC 12/05/21  9:25 AM Phone: [phone] Fax: [phone]

## 2021-12-05 ENCOUNTER — Encounter: Payer: Self-pay | Admitting: Physical Therapy

## 2021-12-05 ENCOUNTER — Ambulatory Visit: Payer: Medicare HMO | Admitting: Physical Therapy

## 2021-12-05 ENCOUNTER — Other Ambulatory Visit: Payer: Self-pay

## 2021-12-05 DIAGNOSIS — G8929 Other chronic pain: Secondary | ICD-10-CM

## 2021-12-05 DIAGNOSIS — M25561 Pain in right knee: Secondary | ICD-10-CM | POA: Diagnosis not present

## 2021-12-05 DIAGNOSIS — R2689 Other abnormalities of gait and mobility: Secondary | ICD-10-CM | POA: Diagnosis not present

## 2021-12-05 DIAGNOSIS — M6281 Muscle weakness (generalized): Secondary | ICD-10-CM | POA: Diagnosis not present

## 2021-12-11 NOTE — Therapy (Signed)
OUTPATIENT PHYSICAL THERAPY TREATMENT NOTE   Patient Name: Emily [name] MRN: [id number] DOB:[birth year], 71 y.o., female Today's Date: 12/12/2021  PCP: Ladell Pier, MD   REFERRING PROVIDER: Ladell Pier, MD   END OF SESSION:   PT End of Session - 12/12/21 0925     Visit Number 7    Number of Visits 8    Date for PT Re-Evaluation 12/27/21    Authorization Type [employer] MCR    Authorization Time Period KX by 15th    Progress Note Due on Visit 10    PT Start Time 0918    PT Stop Time 0958    PT Time Calculation (min) 40 min    Activity Tolerance Patient tolerated treatment well    Behavior During Therapy [facility] for tasks assessed/performed                  Past Medical History:  Diagnosis Date   Anemia    iron runs low   Anxiety    Anxiety state, unspecified 11/21/2012   Breast fibrocystic disorder 11/21/2012   Bronchitis    Cataract    Chest pain 11/21/2012   Normal stress echocardiogram 11/2010    History of chicken pox    History of uterine fibroid    Incidental lung nodule, > 32m and < 84m10/08/2011   44m24mUL subpleural nodule found on CXR screening for TB due to h/o TB exposure as a child with positive ppd.  Recheck CT scan in 6-12 mos (March - Sept 2014) Repeat CT scan 01/2013 shows: Stable subpleural nodule in the right upper lobe measuring 5 mm. If this patient is at low risk for lung cancer then no further workup is necessary.  If this patient is at increased risk for lung cancer then foll   Nontraumatic tear of skin    right side of vagina skin since 06-12-15, small amount of blood when wipes   Prediabetes 11/21/2012   Tuberculosis    exposed as a young child   Past Surgical History:  Procedure Laterality Date   BREAST BIOPSY Right 05/28/2016   DENSE STROMAL FIBROSIS    COLONOSCOPY WITH PROPOFOL N/A 04/24/2016   Procedure: COLONOSCOPY WITH PROPOFOL;  Surgeon: MarGarlan FairD;  Location: [facility];  Service: Endoscopy;  Laterality: N/A;    EYE SURGERY Bilateral    lens replacements   FRACTURE SURGERY Right 2003   fibula   surgery for fibroids  2000   Patient Active Problem List   Diagnosis Date Noted   Primary osteoarthritis of right knee 07/15/2021   Influenza vaccine refused 06/13/2020   Dermatitis 06/13/2020   Mixed hyperlipidemia 06/13/2020   Prediabetes 11/21/2012   Breast fibrocystic disorder 11/21/2012   Chest pain 11/21/2012   Anxiety state, unspecified 11/21/2012   Incidental lung nodule, > 3mm33md < 8mm 84m04/2013    REFERRING DIAG: Primary osteoarthritis of right knee  THERAPY DIAG:  Chronic pain of right knee  Muscle weakness (generalized)  Other abnormalities of gait and mobility  Rationale for Evaluation and Treatment Rehabilitation  PERTINENT HISTORY: Right tibial nail 2003  PRECAUTIONS: None  SUBJECTIVE: Patient reports her right knee continues to do well. She denies any knee pain with activity and feels much stronger.  PAIN:  Are you having pain?   NPRS scale: 0/10 (3/10 with activity) Pain location: Right knee Pain description: Intermittent, stiff, aching Aggravating factors: Standing, housework, yard work Relieving factors: Rest, getting off knee, using ACE wrap, medication  PATIENT GOALS: Reduce knee pain and stiffness   OBJECTIVE: (objective measures completed at initial evaluation unless otherwise dated) PATIENT SURVEYS:  FOTO 39% functional status  11/28/2021: 63%   MUSCLE LENGTH: Bilateral quad flexibility deficit   PALPATION: Mildly tender to palpation right knee medial joint line   LOWER EXTREMITY ROM:   Active ROM Right eval Left eval Right 11/21/2021  Knee flexion 117 121   Knee extension 3 0 2    LOWER EXTREMITY MMT:   MMT Right eval Left eval Rt / Lt 12/12/2021  Hip flexion 4 4   Hip extension 4- 4- 4 / 4  Hip abduction 4- 4 4 / 4  Knee flexion 4+ 4+ 5 / 5  Knee extension 4 4+ 5 / 5    FUNCTIONAL TESTS:  5 times sit to stand: 14  seconds  11/15/2021: 10 seconds   GAIT: Assistive device utilized: None Level of assistance: Complete Independence Comments: Slightly antalgic on right     TODAY'S TREATMENT: [facility] Adult PT Treatment:                                                DATE: 12/12/2021 Therapeutic Exercise: NuStep L6 x 5 min with UE/LE while taking subjective SLR 2 x 15 each Side clamshell with green 2 x 15 each Sidelying hip abduction 2 x 10 each Prone hip extension 2 x 15 each Sit to stand without UE support 3 x 10 LAQ with green 2 x 15 SLS 3 x 30 sec each   [facility] Adult PT Treatment:                                                DATE: 12/05/2021 Therapeutic Exercise: NuStep L6 x 5 min with UE/LE while taking subjective Slant board calf stretch 3 x 30 sec Seated hamstring stretch 2 x 30 sec each Prone quad stretch 3 x 30 sec Leg press (cybex) 60# 3 x 10 Standing hip abduction machine 22.5# 2 x 15 each SLS 3 x 30 sec each Forward step-up on 8" box 2 x 10 each Lateral step-up and over on 8" box 2 x 10 Knee extension machine 25# 2 x 15  [facility] Adult PT Treatment:                                                DATE: 11/28/2021 Therapeutic Exercise: NuStep L6 x 5 min with UE/LE while taking subjective Slant board calf stretch 3 x 30 sec Seated hamstring stretch 3 x 30 sec each Sit to stand 3 x 10 holding 15# weight Standing hip abduction machine 12.5# 2 x 15 each Heel raises 2 x 20 Forward step-up on 8" box 2 x 10 each Lateral step-up and over on 6" box 2 x 10 SLS 3 x 30 sec each Knee extension machine 25# 2 x 15 each Knee flexion machine 25# 2 x 15 each   PATIENT EDUCATION:  Education details: POC discharge, HEP finalization Person educated: Patient Education method: Explanation, Handout Education comprehension: Verbalized understanding   HOME EXERCISE PROGRAM: Access Code: [email or web address]  ASSESSMENT: CLINICAL IMPRESSION: Patient tolerated therapy well with no adverse effects. She  demonstrates great improvement with therapy, reporting improved functional ability and no pain with activity, and exhibiting improve strength. She is independent with her HEP and will be formally discharged from PT as she has achieved all established goals and skilled PT is no longer indicate this time.      OBJECTIVE IMPAIRMENTS Abnormal gait, decreased activity tolerance, decreased ROM, decreased strength, impaired flexibility, improper body mechanics, and pain.    ACTIVITY LIMITATIONS standing, squatting, sleeping, and locomotion level   PARTICIPATION LIMITATIONS: cleaning, shopping, community activity, and yard work   [place], Past/current experiences, and Time since onset of injury/illness/exacerbation are also affecting patient's functional outcome.      GOALS: Goals reviewed with patient? Yes   SHORT TERM GOALS: Target date: 11/29/2021    Patient will be I with initial HEP in order to progress with therapy. Baseline: HEP provided at eval 11/28/2021: independent  Goal status: MET   2.  PT will review FOTO with patient by 3rd visit in order to understand expected progress and outcome with therapy. Baseline: FOTO assessed at eval 11/15/2021: reviewed Goal status: MET   3.  Patient will perform 5xSTS in </= 11 seconds to indicate improved LE strength and functional mobility Baseline: 14 seconds 11/15/2021: 10 seconds Goal status: MET   LONG TERM GOALS: Target date: 12/27/2021    Patient will be I with final HEP to maintain progress from PT. Baseline: HEP provided at eval 12/12/2021: patient independent with HEP Goal status: MET   2.  Patient will report >/= 58% status on FOTO to indicate improved functional ability. Baseline: 39% functional status 11/28/2021: 63% Goal status: MET   3.  Patient will demonstrate right knee strength 5/5 MMT and hip strength >/= 4/5 MMT in order to improve standing and walking tolerance. Baseline: patient demonstrates gross knee and  hip strength deficits 12/12/2021: hip strength grossly 4/5, knee strength 5/5 Goal status: MET   4.  Patient will report right knee pain </= 2/10 with activities including walking, yard work, and standing to reduce functional limitations Baseline: 5/10 pain with activity 12/12/2021: patient denies any knee pain with activity Goal status: MET     PLAN: PT FREQUENCY: 1x/week   PT DURATION: 8 weeks   PLANNED INTERVENTIONS: Therapeutic exercises, Therapeutic activity, Neuromuscular re-education, Balance training, Gait training, Patient/Family education, Joint manipulation, Joint mobilization, Aquatic Therapy, Dry Needling, Cryotherapy, Moist heat, Taping, Manual therapy, and Re-evaluation   PLAN FOR NEXT SESSION: NA - discharge    Hilda Blades, PT, DPT, LAT, ATC 12/12/21  9:59 AM Phone: [phone] Fax: [phone]   PHYSICAL THERAPY DISCHARGE SUMMARY  Visits from Start of Care: 7  Current functional level related to goals / functional outcomes: See above   Remaining deficits: See above   Education / Equipment: HEP   Patient agrees to discharge. Patient goals were met. Patient is being discharged due to meeting the stated rehab goals.

## 2021-12-12 ENCOUNTER — Encounter: Payer: Self-pay | Admitting: Physical Therapy

## 2021-12-12 ENCOUNTER — Other Ambulatory Visit: Payer: Self-pay

## 2021-12-12 ENCOUNTER — Ambulatory Visit: Payer: Medicare HMO | Attending: Internal Medicine | Admitting: Physical Therapy

## 2021-12-12 DIAGNOSIS — M25561 Pain in right knee: Secondary | ICD-10-CM | POA: Diagnosis not present

## 2021-12-12 DIAGNOSIS — G8929 Other chronic pain: Secondary | ICD-10-CM | POA: Diagnosis not present

## 2021-12-12 DIAGNOSIS — R2689 Other abnormalities of gait and mobility: Secondary | ICD-10-CM | POA: Diagnosis not present

## 2021-12-12 DIAGNOSIS — M6281 Muscle weakness (generalized): Secondary | ICD-10-CM | POA: Diagnosis not present

## 2021-12-12 NOTE — Patient Instructions (Signed)
Access Code: [email or web address] URL: [email or web address] Date: 12/12/2021 Prepared by: Hilda Blades  Exercises - Active Straight Leg Raise with Quad Set  - 1 x daily - 3 sets - 15 reps - Clam with Resistance  - 1 x daily - 3 sets - 15 reps - Sidelying Hip Abduction  - 1 x daily - 3 sets - 10 reps - Prone Hip Extension  - 1 x daily - 3 sets - 15 reps - Seated Knee Extension with Resistance  - 1 x daily - 3 sets - 15 reps - Sit to Stand  - 1 x daily - 3 sets - 10 reps - Standing Single Leg Stance with Counter Support  - 1 x daily - 3 reps - 30 seconds hold

## 2021-12-26 ENCOUNTER — Encounter: Payer: Self-pay | Admitting: Physician Assistant

## 2021-12-26 ENCOUNTER — Ambulatory Visit: Payer: Self-pay

## 2021-12-26 ENCOUNTER — Ambulatory Visit: Payer: Medicare HMO | Admitting: Physician Assistant

## 2021-12-26 VITALS — BP 98/64 | HR 93 | Resp 18 | Ht 63.0 in | Wt 142.0 lb

## 2021-12-26 DIAGNOSIS — J302 Other seasonal allergic rhinitis: Secondary | ICD-10-CM

## 2021-12-26 DIAGNOSIS — H9201 Otalgia, right ear: Secondary | ICD-10-CM

## 2021-12-26 MED ORDER — CETIRIZINE HCL 10 MG PO TABS: 10.0000 mg | ORAL_TABLET | Freq: Every day | ORAL | 11 refills | Status: DC

## 2021-12-26 NOTE — Progress Notes (Unsigned)
   Established Patient Office Visit  Subjective   Patient ID: MONEKA MCQUINN, female    DOB: Nov 28, 1950  Age: 71 y.o. MRN: [id number]  Chief Complaint  Patient presents with   Ear Pain    Started last week. Right ear feels swollen and irritated    States that she has been havng pain in her right ear, worse when  Tylenol with a little releif  Train trip coming up and worried about it     {History (Optional):23778}  ROS    Objective:     BP 98/64 (BP Location: Left Arm, Patient Position: Sitting, Cuff Size: Normal)   Pulse 93   Resp 18   Ht '5\' 3"'$  (1.6 m)   Wt 142 lb (64.4 kg)   SpO2 97%   BMI 25.15 kg/m  {Vitals History (Optional):23777}  Physical Exam   No results found for any visits on 12/26/21.  {Labs (Optional):23779}  The 10-year ASCVD risk score (Arnett DK, et al., 2019) is: 8.2%    Assessment & Plan:   Problem List Items Addressed This Visit   None   No follow-ups on file.    Loraine Grip Mayers, PA-C

## 2021-12-26 NOTE — Telephone Encounter (Signed)
Third attempt to reach pt. Left message to call back. 

## 2021-12-26 NOTE — Telephone Encounter (Signed)
2nd attempt to return call.   Left voicemail to call back.

## 2021-12-26 NOTE — Telephone Encounter (Signed)
Pt called and wants to discuss her A1C + Bone density results.   Best contact: [phone]    Left message to call back.

## 2021-12-26 NOTE — Patient Instructions (Signed)
I encourage you to start taking Zyrtec on a daily basis.  You can use Tylenol or warm compress to help with your ear pain in the meantime.  I hope that you feel better soon and you enjoy your trip.  Please let us know if there is anything else we can do for you  Emily Rad, PA-C Physician Assistant [place] [email or web address]   Earache, Adult An earache, or ear pain, can be caused by many things, including: An infection. Ear wax buildup. Ear pressure. Something in the ear that should not be there (foreign body). A sore throat. Tooth problems. Jaw problems. Treatment of the earache will depend on the cause. If the cause is not clear or cannot be determined, you may need to watch your symptoms until your earache goes away or until a cause is found. Follow these instructions at home: Medicines Take or apply over-the-counter and prescription medicines only as told by your health care provider. If you were prescribed an antibiotic medicine, use it as told by your health care provider. Do not stop using the antibiotic even if you start to feel better. Do not put anything in your ear other than medicine that is prescribed by your health care provider. Managing pain If directed, apply heat to the affected area as often as told by your health care provider. Use the heat source that your health care provider recommends, such as a moist heat pack or a heating pad. Place a towel between your skin and the heat source. Leave the heat on for 20-30 minutes. Remove the heat if your skin turns bright red. This is especially important if you are unable to feel pain, heat, or cold. You may have a greater risk of getting burned. If directed, put ice on the affected area as often as told by your health care provider. To do this:     Put ice in a plastic bag. Place a towel between your skin and the bag. Leave the ice on for 20 minutes, 2-3  times a day. General instructions Pay attention to any changes in your symptoms. Try resting in an upright position instead of lying down. This may help to reduce pressure in your ear and relieve pain. Chew gum if it helps to relieve your ear pain. Treat any allergies as told by your health care provider. Drink enough fluid to keep your urine pale yellow. It is up to you to get the results of any tests that were done. Ask your health care provider, or the department that is doing the tests, when your results will be ready. Keep all follow-up visits as told by your health care provider. This is important. Contact a health care provider if: Your pain does not improve within 2 days. Your earache gets worse. You have new symptoms. You have a fever. Get help right away if you: Have a severe headache. Have a stiff neck. Have trouble swallowing. Have redness or swelling behind your ear. Have fluid or blood coming from your ear. Have hearing loss. Feel dizzy. Summary An earache, or ear pain, can be caused by many things. Treatment of the earache will depend on the cause. Follow recommendations from your health care provider to treat your ear pain. If the cause is not clear or cannot be determined, you may need to watch your symptoms until your earache goes away or until a cause is found. Keep all follow-up visits as told by your health care provider. This is important.  This information is not intended to replace advice given to you by your health care provider. Make sure you discuss any questions you have with your health care provider. Document Revised: 12/05/2018 Document Reviewed: 12/06/2018 Elsevier Patient Education  [place].

## 2021-12-26 NOTE — Telephone Encounter (Signed)
Pt was called and informed of lab results and MM results.

## 2022-01-29 ENCOUNTER — Encounter: Payer: Self-pay | Admitting: Pharmacist

## 2022-01-29 ENCOUNTER — Ambulatory Visit: Payer: Medicare HMO | Attending: Internal Medicine | Admitting: Pharmacist

## 2022-01-29 VITALS — BP 107/66 | HR 79 | Temp 98.6°F

## 2022-01-29 DIAGNOSIS — Z0000 Encounter for general adult medical examination without abnormal findings: Secondary | ICD-10-CM

## 2022-01-29 NOTE — Progress Notes (Signed)
Subjective:   Emily [name] is a 71 y.o. female who presents for Medicare Annual (Subsequent) preventive examination.       Objective:    Today's Vitals   01/29/22 0839 01/29/22 0840  BP:  107/66  Pulse:  79  Temp: 98.6 F (37 C)   PainSc: 0-No pain 0-No pain   There is no height or weight on file to calculate BMI.     01/29/2022    8:48 AM 11/01/2021   11:01 AM 02/25/2021    8:57 AM 09/13/2020   10:28 AM 04/24/2016   11:42 AM 04/24/2016   11:20 AM 04/20/2016    3:55 PM  Advanced Directives  Does Patient Have a Medical Advance Directive? No No No No No No No  Would patient like information on creating a medical advance directive? No - Patient declined No - Patient declined Yes (MAU/Ambulatory/Procedural Areas - Information given) Yes (Inpatient - patient defers creating a medical advance directive at this time - Information given)       Current Medications (verified) Outpatient Encounter Medications as of 01/29/2022  Medication Sig   Ascorbic Acid (VITAMIN C) 100 MG tablet Take 100 mg by mouth daily.   atorvastatin (LIPITOR) 10 MG tablet Take 1 tablet (10 mg total) by mouth daily.   cetirizine (ZYRTEC ALLERGY) 10 MG tablet Take 1 tablet (10 mg total) by mouth daily.   cholecalciferol (VITAMIN D3) 25 MCG (1000 UNIT) tablet Take 1,000 Units by mouth daily.   cyanocobalamin (CYANOCOBALAMIN) 500 MCG tablet Take 500 mcg by mouth daily.   ferrous sulfate 325 (65 FE) MG tablet Take 325 mg by mouth daily with breakfast.   glucosamine-chondroitin 500-400 MG tablet Take 1 tablet by mouth 3 (three) times daily.   No facility-administered encounter medications on file as of 01/29/2022.    Allergies (verified) Latex   History: Past Medical History:  Diagnosis Date   Anemia    iron runs low   Anxiety    Anxiety state, unspecified 11/21/2012   Breast fibrocystic disorder 11/21/2012   Bronchitis    Cataract    Chest pain 11/21/2012   Normal stress echocardiogram 11/2010     History of chicken pox    History of uterine fibroid    Incidental lung nodule, > 61m and < 8348m10/08/2011   48m748mUL subpleural nodule found on CXR screening for TB due to h/o TB exposure as a child with positive ppd.  Recheck CT scan in 6-12 mos (March - Sept 2014) Repeat CT scan 01/2013 shows: Stable subpleural nodule in the right upper lobe measuring 5 mm. If this patient is at low risk for lung cancer then no further workup is necessary.  If this patient is at increased risk for lung cancer then foll   Nontraumatic tear of skin    right side of vagina skin since 06-12-15, small amount of blood when wipes   Prediabetes 11/21/2012   Tuberculosis    exposed as a young child   Past Surgical History:  Procedure Laterality Date   BREAST BIOPSY Right 05/28/2016   DENSE STROMAL FIBROSIS    COLONOSCOPY WITH PROPOFOL N/A 04/24/2016   Procedure: COLONOSCOPY WITH PROPOFOL;  Surgeon: MarGarlan FairD;  Location: [facility];  Service: Endoscopy;  Laterality: N/A;   EYE SURGERY Bilateral    lens replacements   FRACTURE SURGERY Right 2003   fibula   surgery for fibroids  2000   Family History  Problem Relation Age of Onset  Diabetes Brother    Hypertension Brother    Alcohol abuse Brother    Mental illness Brother        nervous breakdown   Stroke Maternal Grandmother    Depression Paternal Grandmother    Arthritis Mother    COPD Father 45       decsd due to lack of oxygen   Hypertension Father    Alcohol abuse Father    Gout Father    Alcohol abuse Brother    Hypertension Brother    Social History   Socioeconomic History   Marital status: Divorced    Spouse name: Not on file   Number of children: 2   Years of education: Not on file   Highest education level: Some college, no degree  Occupational History   Occupation: Day-care  Tobacco Use   Smoking status: Never   Smokeless tobacco: Never  Vaping Use   Vaping Use: Never used  Substance and Sexual Activity   Alcohol  use: No   Drug use: No   Sexual activity: Never  Other Topics Concern   Not on file  Social History Narrative   Not on file   Social Determinants of Health   Financial Resource Strain: Not on file  Food Insecurity: Not on file  Transportation Needs: Not on file  Physical Activity: Not on file  Stress: Not on file  Social Connections: Not on file    Tobacco Counseling Counseling given: Not Answered   Clinical Intake:  Pre-visit preparation completed: No  Pain : No/denies pain Pain Score: 0-No pain     Nutritional Status: BMI of 19-24  Normal Diabetes: No  How often do you need to have someone help you when you read instructions, pamphlets, or other written materials from your doctor or pharmacy?: 1 - Never What is the last grade level you completed in school?: Some college  Diabetic? No  Interpreter Needed?: No  Information entered by :: Kenly   Activities of Daily Living    01/29/2022    8:49 AM  In your present state of health, do you have any difficulty performing the following activities:  Hearing? 0  Vision? 0  Comment Dr. Herbert Deaner - yearly  Difficulty concentrating or making decisions? 0  Walking or climbing stairs? 0  Dressing or bathing? 0  Doing errands, shopping? 0  Using the Toilet? N  In the past six months, have you accidently leaked urine? Y  Comment Uses liners  Do you have problems with loss of bowel control? N  Managing your Medications? N  Managing your Finances? N  Housekeeping or managing your Housekeeping? N    Patient Care Team: Ladell Pier, MD as PCP - General (Internal Medicine) Jerline Pain, MD as PCP - Cardiology (Cardiology) Gaynelle Arabian, MD as Consulting Physician (Orthopedic Surgery) Garlan Fair, MD as Consulting Physician (Gastroenterology)  Indicate any recent Medical Services you may have received from other than [facility] providers in the past year (date may be approximate).     Assessment:   This is a  routine wellness examination for [name].  Hearing/Vision screen No results found.  Dietary issues and exercise activities discussed: Current Exercise Habits: Home exercise routine, Type of exercise: walking;Other - see comments (Childcare), Time (Minutes): 30, Frequency (Times/Week): 7, Weekly Exercise (Minutes/Week): 210, Intensity: Mild   Goals Addressed   None   Depression Screen    01/29/2022    8:49 AM 12/26/2021    2:24 PM 11/28/2021  2:55 PM 01/27/2021    3:03 PM 09/13/2020   10:27 AM 08/17/2020    3:28 PM 06/13/2020    8:41 AM  PHQ 2/9 Scores  PHQ - 2 Score 0 0 0 0 0 0 0  PHQ- 9 Score  1 1  0 0     Fall Risk    01/29/2022    8:49 AM 12/26/2021    2:52 PM 11/28/2021    1:53 PM 01/27/2021    3:03 PM 09/13/2020   10:27 AM  Fall Risk   Falls in the past year? 0 0 0 0 0  Number falls in past yr: 0 0 0 0 0  Injury with Fall? 0 0 0 0 0  Risk for fall due to :   No Fall Risks No Fall Risks No Fall Risks  Follow up Falls evaluation completed;Education provided;Falls prevention discussed  Falls evaluation completed      FALL RISK PREVENTION PERTAINING TO THE HOME:  Any stairs in or around the home? Yes  If so, are there any without handrails? No  Home free of loose throw rugs in walkways, pet beds, electrical cords, etc? Yes  Adequate lighting in your home to reduce risk of falls? Yes   ASSISTIVE DEVICES UTILIZED TO PREVENT FALLS:  Life alert? No  Use of a cane, walker or w/c? No  Grab bars in the bathroom? No  Shower chair or bench in shower? No  - pt requests one.  Elevated toilet seat or a handicapped toilet? No   TIMED UP AND GO:  Was the test performed? Yes .  Length of time to ambulate 10 feet: 5 sec.   Gait steady and fast without use of assistive device  Cognitive Function:    01/29/2022    8:53 AM 09/13/2020   10:28 AM  MMSE - Mini Mental State Exam  Orientation to time 5 5  Orientation to Place 5 5  Registration 3 3  Attention/ Calculation 5 5  Recall  3 3  Language- name 2 objects 2 2  Language- repeat 1 1  Language- follow 3 step command 3 3  Language- read & follow direction 1 1  Write a sentence 1 1  Copy design 1 0  Total score 30 29        Immunizations Immunization History  Administered Date(s) Administered   Influenza,inj,Quad PF,6+ Mos 01/27/2015, 02/16/2017, 01/27/2021   Influenza-Unspecified 01/29/2013, 02/25/2016   Pneumococcal Conjugate-13 01/27/2015   Pneumococcal Polysaccharide-23 03/29/2016   Tdap 06/13/2020   Zoster Recombinat (Shingrix) 07/31/2016    TDAP status: Up to date  Flu Vaccine status: Declined, Education has been provided regarding the importance of this vaccine but patient still declined. Advised may receive this vaccine at local pharmacy or Health Dept. Aware to provide a copy of the vaccination record if obtained from local pharmacy or Health Dept. Verbalized acceptance and understanding.  Pneumococcal vaccine status: Up to date  Covid-19 vaccine status: Information provided on how to obtain vaccines.   Qualifies for Shingles Vaccine? Yes   Zostavax completed No   Shingrix Completed?: No.    Education has been provided regarding the importance of this vaccine. Patient has been advised to call insurance company to determine out of pocket expense if they have not yet received this vaccine. Advised may also receive vaccine at local pharmacy or Health Dept. Verbalized acceptance and understanding.  Screening Tests Health Maintenance  Topic Date Due   Zoster Vaccines- Shingrix (2 of 2) 09/25/2016  INFLUENZA VACCINE  12/12/2021   COVID-19 Vaccine (1) 02/12/2022 (Originally 10/16/1951)   MAMMOGRAM  11/03/2023   COLONOSCOPY (Pts 45-55yr Insurance coverage will need to be confirmed)  04/24/2026   TETANUS/TDAP  06/13/2030   Pneumonia Vaccine 71 Years old  Completed   DEXA SCAN  Completed   Hepatitis C Screening  Completed   HPV VACCINES  Aged Out    Health Maintenance  Health Maintenance Due   Topic Date Due   Zoster Vaccines- Shingrix (2 of 2) 09/25/2016   INFLUENZA VACCINE  12/12/2021    Colorectal cancer screening: Type of screening: Colonoscopy. Completed 2017. Repeat every 10 years  Mammogram status: Completed 10/2021. Repeat every year  Bone Density status: Completed 03/22/2021. Results reflect: Bone density results: NORMAL. Repeat every 5 years.  Lung Cancer Screening: (Low Dose CT Chest recommended if Age 73-80years, 30 pack-year currently smoking OR have quit w/in 15years.) does not qualify.   Lung Cancer Screening Referral: Not needed   Additional Screening:  Hepatitis C Screening: does not qualify; Completed already  Vision Screening: Recommended annual ophthalmology exams for early detection of glaucoma and other disorders of the eye. Is the patient up to date with their annual eye exam?  Yes   Dental Screening: Recommended annual dental exams for proper oral hygiene  Community Resource Referral / Chronic Care Management: CRR required this visit?  No   CCM required this visit?  No      Plan:     I have personally reviewed and noted the following in the patient's chart:   Medical and social history Use of alcohol, tobacco or illicit drugs  Current medications and supplements including opioid prescriptions. Patient is not currently taking opioid prescriptions. Functional ability and status Nutritional status Physical activity Advanced directives List of other physicians Hospitalizations, surgeries, and ER visits in previous 12 months Vitals Screenings to include cognitive, depression, and falls Referrals and appointments  In addition, I have reviewed and discussed with patient certain preventive protocols, quality metrics, and best practice recommendations. A written personalized care plan for preventive services as well as general preventive health recommendations were provided to patient.     STresa Endo RPH-CPP   01/29/2022

## 2022-01-30 ENCOUNTER — Telehealth: Payer: Self-pay | Admitting: Family Medicine

## 2022-01-30 MED ORDER — MISC. DEVICES MISC: 0 refills | Status: DC

## 2022-01-30 NOTE — Telephone Encounter (Signed)
Patient request bedside commode and shower chair.  I am covering for her PCP and have written prescription to be faxed to [place].

## 2022-02-09 ENCOUNTER — Ambulatory Visit: Payer: Self-pay

## 2022-02-09 NOTE — Telephone Encounter (Signed)
Summary: Vaginal/rectacl Discharge?   Patient states that when she goes to the bathroom she has a clear slim like discharge that comes out on her panty liner and in the toilet. Patient is unsure if it is coming from her vagina or rectum. Patient is currently in [place] caring for her mother. Please advise patient.            Called pt [email or web address].

## 2022-02-09 NOTE — Telephone Encounter (Signed)
Summary: Vaginal/rectacl Discharge?   Patient states that when she goes to the bathroom she has a clear slim like discharge that comes out on her panty liner and in the toilet. Patient is unsure if it is coming from her vagina or rectum. Patient is currently in [place] caring for her mother. Please advise patient.      Reason for Disposition  Normal vaginal discharge  Answer Assessment - Initial Assessment Questions 1. DISCHARGE: "Describe the discharge." (e.g., white, yellow, green, gray, foamy, cottage cheese-like)     Thin clear discharge from Vagina and  2. ODOR: "Is there a bad odor?"     no 3. ONSET: "When did the discharge begin?"     Tuesday morning 4. RASH: "Is there a rash in the genital area?" If Yes, ask: "Describe it." (e.g., redness, blisters, sores, bumps)     no 5. ABDOMEN PAIN: "Are you having any abdomen pain?" If Yes, ask: "What does it feel like? " (e.g., crampy, dull, intermittent, constant)      No pain 6. ABDOMEN PAIN SEVERITY: If present, ask: "How bad is it?" (e.g., Scale 1-10; mild, moderate, or severe)   - MILD (1-3): Doesn't interfere with normal activities, abdomen soft and not tender to touch.    - MODERATE (4-7): Interferes with normal activities or awakens from sleep, abdomen tender to touch.    - SEVERE (8-10): Excruciating pain, doubled over, unable to do any normal activities. (R/O peritonitis)      na 7. CAUSE: "What do you think is causing the discharge?" "Have you had the same problem before? What happened then?"     Unsure perhaps stress 8. OTHER SYMPTOMS: "Do you have any other symptoms?" (e.g., fever, itching, vaginal bleeding, pain with urination, injury to genital area, vaginal foreign body)      9. PREGNANCY: "Is there any chance you are pregnant?" "When was your last menstrual period?"  Protocols used: Vaginal Discharge-A-AH

## 2022-02-09 NOTE — Telephone Encounter (Signed)
FYI

## 2022-02-09 NOTE — Telephone Encounter (Signed)
  Chief Complaint: Vaginal discharge, may also be coming from rectum Symptoms: ibid Frequency: Tuesday morning Pertinent Negatives: Patient denies Pain itching foul odor Disposition: '[]'$ ED /'[]'$ Urgent Care (no appt availability in office) / '[]'$ Appointment(In office/virtual)/ '[]'$  [place] [facility]/ '[]'$ Home Care/ '[]'$ Refused Recommended Disposition /'[]'$ [facility]/ '[x]'$  Follow-up with PCP Additional Notes: Pt has a clear thin vaginal discharge that she staes she sometimes also has with BMs. PT is unsure exactly where discharge is coming from. NO pain, odor, itching. Pt had this a few weeks back and it resolved with out any intervention. Pt is out of town. Pt will go to UC if discharge changes. Pt may also follow up with her daughter, a physician, while in [place].  Made follow up appt for when pt returns to [facility].

## 2022-03-14 ENCOUNTER — Telehealth: Payer: Self-pay | Admitting: Emergency Medicine

## 2022-03-14 NOTE — Telephone Encounter (Signed)
Copied from [place] [phone]. Topic: General - Other >> Mar 14, 2022  9:04 AM Everette C wrote: Reason for CRM: The patient would like to speak with a member of clinical staff about previously discussed medical supplies   The patient would like to speak further with a member of staff about briefs, pads and shower chairs   Please contact when available

## 2022-03-14 NOTE — Telephone Encounter (Signed)
Call returned to patient. She explained that she has returned from [place] and would like to get her shower chair and BSC.  I told her that I will need to see what company the order was sent to and then call her back.  I spoke to [facility] who confirmed that the order was received for the shower chair and [facility] and they spoke to the patient and she requested the order not be shipped until the end of October.  Denae stated that the patient just needs to call them to schedule delivery and pay her out of pocket cost.  I called the patient  back and explained the information from [facility]. I provided her with the phone number for [employer] [phone], and she said she will call.  She also said she was aware of the out of pocket cost.   Regarding the incontinence products, I told her that we do not have an order for those supplies. The patient stated that she may have ordered them from somewhere else. I told her that if she is still interested, she needs to speak to the provider she is seeing at [facility] later this month. If she qualifies for the products an order can be sent to the supplier of her choice.

## 2022-03-28 ENCOUNTER — Ambulatory Visit: Payer: Medicare Other | Attending: Nurse Practitioner | Admitting: Physician Assistant

## 2022-03-28 ENCOUNTER — Telehealth: Payer: Self-pay

## 2022-03-28 ENCOUNTER — Encounter: Payer: Self-pay | Admitting: Physician Assistant

## 2022-03-28 ENCOUNTER — Other Ambulatory Visit (HOSPITAL_COMMUNITY)
Admission: RE | Admit: 2022-03-28 | Discharge: 2022-03-28 | Disposition: A | Discharge status: Home / Self Care | Payer: Medicare Other | Source: Ambulatory Visit | Attending: Nurse Practitioner | Admitting: Nurse Practitioner

## 2022-03-28 VITALS — BP 105/71 | HR 74 | Wt 147.4 lb

## 2022-03-28 DIAGNOSIS — E782 Mixed hyperlipidemia: Secondary | ICD-10-CM | POA: Diagnosis not present

## 2022-03-28 DIAGNOSIS — R7303 Prediabetes: Secondary | ICD-10-CM

## 2022-03-28 DIAGNOSIS — R195 Other fecal abnormalities: Secondary | ICD-10-CM | POA: Diagnosis not present

## 2022-03-28 DIAGNOSIS — D508 Other iron deficiency anemias: Secondary | ICD-10-CM | POA: Diagnosis not present

## 2022-03-28 DIAGNOSIS — N898 Other specified noninflammatory disorders of vagina: Secondary | ICD-10-CM

## 2022-03-28 DIAGNOSIS — N3944 Nocturnal enuresis: Secondary | ICD-10-CM | POA: Diagnosis not present

## 2022-03-28 DIAGNOSIS — N3943 Post-void dribbling: Secondary | ICD-10-CM | POA: Insufficient documentation

## 2022-03-28 LAB — POCT URINALYSIS DIP (CLINITEK)
Bilirubin, UA: NEGATIVE
Blood, UA: NEGATIVE
Glucose, UA: NEGATIVE mg/dL
Ketones, POC UA: NEGATIVE mg/dL
Leukocytes, UA: NEGATIVE
Nitrite, UA: NEGATIVE
POC PROTEIN,UA: NEGATIVE
Spec Grav, UA: 1.02 (ref 1.010–1.025)
Urobilinogen, UA: 0.2 E.U./dL
pH, UA: 6 (ref 5.0–8.0)

## 2022-03-28 LAB — GLUCOSE, POCT (MANUAL RESULT ENTRY): POC Glucose: 93 mg/dl (ref 70–99)

## 2022-03-28 MED ORDER — ATORVASTATIN CALCIUM 10 MG PO TABS: 10.0000 mg | ORAL_TABLET | Freq: Every day | ORAL | 1 refills | Status: DC

## 2022-03-28 NOTE — Progress Notes (Signed)
Patient ID: Emily [name], female   DOB: 01-23-1951, 71 y.o.   MRN: [id number]   Emily [name], is a 71 y.o. female  [id number]  [id number]  DOB - 07-04-1950  Chief Complaint  Patient presents with   Vaginal Discharge       Subjective:   Emily [name] is a 71 y.o. female here today for for med RF.  She has noticed when she is around family and feeling anxious lately she has experienced some upset stomach with loose stools.  Sometimes watery.  No fever.  This occurred a couple of months ago while she was in [place] and again when she was in [place] about 1 month ago.  No melena/hematochezia.  Resolved both times after being back home.    Also continuing to have problems with vaginal discharge and incontinence at night.  She needs to use chuck pads due to urinating in the bed.  This has been going on a while.  No abdominal or pelvic pain  No problems updated.  ALLERGIES: Allergies  Allergen Reactions   Latex Itching and Rash    PAST MEDICAL HISTORY: Past Medical History:  Diagnosis Date   Anemia    iron runs low   Anxiety    Anxiety state, unspecified 11/21/2012   Breast fibrocystic disorder 11/21/2012   Bronchitis    Cataract    Chest pain 11/21/2012   Normal stress echocardiogram 11/2010    History of chicken pox    History of uterine fibroid    Incidental lung nodule, > 47m and < 862m10/08/2011   98m54mUL subpleural nodule found on CXR screening for TB due to h/o TB exposure as a child with positive ppd.  Recheck CT scan in 6-12 mos (March - Sept 2014) Repeat CT scan 01/2013 shows: Stable subpleural nodule in the right upper lobe measuring 5 mm. If this patient is at low risk for lung cancer then no further workup is necessary.  If this patient is at increased risk for lung cancer then foll   Nontraumatic tear of skin    right side of vagina skin since 06-12-15, small amount of blood when wipes   Prediabetes 11/21/2012   Tuberculosis    exposed as a young child     MEDICATIONS AT HOME: Prior to Admission medications   Medication Sig Start Date End Date Taking? Authorizing Provider  Ascorbic Acid (VITAMIN C) 100 MG tablet Take 100 mg by mouth daily.   Yes [provider]  cetirizine (ZYRTEC ALLERGY) 10 MG tablet Take 1 tablet (10 mg total) by mouth daily. 12/26/21  Yes Mayers, Cari S, PA-C  cholecalciferol (VITAMIN D3) 25 MCG (1000 UNIT) tablet Take 1,000 Units by mouth daily.   Yes [provider]  cyanocobalamin (CYANOCOBALAMIN) 500 MCG tablet Take 500 mcg by mouth daily.   Yes [provider]  ferrous sulfate 325 (65 FE) MG tablet Take 325 mg by mouth daily with breakfast.   Yes [provider]  glucosamine-chondroitin 500-400 MG tablet Take 1 tablet by mouth 3 (three) times daily.   Yes [provider]  atorvastatin (LIPITOR) 10 MG tablet Take 1 tablet (10 mg total) by mouth daily. 03/28/22   McCArgentina DonovanA-C  Misc. Devices MISC 1.  Bedside commode 2.  Shower chair Diagnosis osteoarthritis of the knee 01/30/22   [place] RakesD    ROS: Neg HEENT Neg resp Neg cardiac Neg MS Neg psych Neg neuro  Objective:   Vitals:   03/28/22  1503  BP: 105/71  Pulse: 74  SpO2: 96%  Weight: 147 lb 6.4 oz (66.9 kg)   Exam General appearance : Awake, alert, not in any distress. Speech stuttered to Clear. Not toxic looking HEENT: Atraumatic and Normocephalic Neck: Supple, no JVD. No cervical lymphadenopathy.  Chest: Good air entry bilaterally, CTAB.  No rales/rhonchi/wheezing CVS: S1 S2 regular, no murmurs.  Abdomen: Bowel sounds present, Non tender and not distended with no gaurding, rigidity or rebound. Extremities: B/L Lower Ext shows no edema, both legs are warm to touch Neurology: Awake alert, and oriented X 3, CN II-XII intact, Non focal Skin: No Rash  Data Review Lab Results  Component Value Date   HGBA1C 6.2 (H) 11/28/2021   HGBA1C 6.2 (H) 01/27/2021   HGBA1C 6.0 (H) 06/13/2020     Assessment & Plan   1. Vaginal discharge - Glucose (CBG) - Cervicovaginal ancillary only - POCT URINALYSIS DIP (CLINITEK) - Comprehensive metabolic panel  2. Nocturnal enuresis Will order chuck pads if needed - Glucose (CBG) - Cervicovaginal ancillary only - POCT URINALYSIS DIP (CLINITEK)  3. Prediabetes - Comprehensive metabolic panel - Hemoglobin A1c  4. Loose stools This seems to be related to travel and/or family stress.  I have encourage her to try chewable pepto if she starts to have these symptoms(per pkg instructions) - Comprehensive metabolic panel - CBC with Differential/Platelet  5. Other iron deficiency anemia - CBC with Differential/Platelet  6. Mixed hyperlipidemia - atorvastatin (LIPITOR) 10 MG tablet; Take 1 tablet (10 mg total) by mouth daily.  Dispense: 90 tablet; Refill: 1    Return in about 4 months (around 07/27/2022) for PCP for chronic conditions.  The patient was given clear instructions to go to ER or return to medical center if symptoms don't improve, worsen or new problems develop. The patient verbalized understanding. The patient was told to call to get lab results if they haven't heard anything in the next week.      Freeman Caldron, PA-C [facility] and [facility] Sims, [place]   03/28/2022, 3:53 PM

## 2022-03-28 NOTE — Patient Instructions (Addendum)
For stomach upset, I recommend trying pepto bismol chewables    Edema  Edema is when you have too much fluid in your body or under your skin. Edema may make your legs, feet, and ankles swell. Swelling often happens in looser tissues, such as around your eyes. This is a common condition. It gets more common as you get older. There are many possible causes of edema. These include: Eating too much salt (sodium). Being on your feet or sitting for a long time. Certain medical conditions, such as: Pregnancy. Heart failure. Liver disease. Kidney disease. Cancer. Hot weather may make edema worse. Edema is usually painless. Your skin may look swollen or shiny. Follow these instructions at home: Medicines Take over-the-counter and prescription medicines only as told by your doctor. Your doctor may prescribe a medicine to help your body get rid of extra water (diuretic). Take this medicine if you are told to take it. Eating and drinking Eat a low-salt (low-sodium) diet as told by your doctor. Sometimes, eating less salt may reduce swelling. Depending on the cause of your swelling, you may need to limit how much fluid you drink (fluid restriction). General instructions Raise the injured area above the level of your heart while you are sitting or lying down. Do not sit still or stand for a long time. Do not wear tight clothes. Do not wear garters on your upper legs. Exercise your legs. This can help the swelling go down. Wear compression stockings as told by your doctor. It is important that these are the right size. These should be prescribed by your doctor to prevent possible injuries. If elastic bandages or wraps are recommended, use them as told by your doctor. Contact a doctor if: Treatment is not working. You have heart, liver, or kidney disease and have symptoms of edema. You have sudden and unexplained weight gain. Get help right away if: You have shortness of breath or chest pain. You  cannot breathe when you lie down. You have pain, redness, or warmth in the swollen areas. You have heart, liver, or kidney disease and get edema all of a sudden. You have a fever and your symptoms get worse all of a sudden. These symptoms may be an emergency. Get help right away. Call 911. Do not wait to see if the symptoms will go away. Do not drive yourself to the hospital. Summary Edema is when you have too much fluid in your body or under your skin. Edema may make your legs, feet, and ankles swell. Swelling often happens in looser tissues, such as around your eyes. Raise the injured area above the level of your heart while you are sitting or lying down. Follow your doctor's instructions about diet and how much fluid you can drink. This information is not intended to replace advice given to you by your health care provider. Make sure you discuss any questions you have with your health care provider. Document Revised: 01/02/2021 Document Reviewed: 01/02/2021 Elsevier Patient Education  [place].

## 2022-03-28 NOTE — Telephone Encounter (Signed)
I met with the patient when she was in the clinic today. She said she has not received her shower chair or BSC but she  has not contacted [facility]. I reminded her of our telephone conversation on 03/14/2022:   [facility] has received for the shower chair and BSC and they spoke to her and she requested the order not be shipped until the end of October. The patient just needs to call them to schedule delivery and pay her out of pocket cost.    She said she is aware that she has an out of pocket expense but is not sure how much. I gave her the phone number for [facility] and she wrote it down and said she will call.  She also will ask [facility] if they provide chux/bed pads and if so, will her insurance cover it.  She does not want pull ups or briefs.   I told her to call me with any questions/concerns about the products/ DME and she said she would.

## 2022-03-29 ENCOUNTER — Ambulatory Visit: Payer: Medicare Other

## 2022-03-29 ENCOUNTER — Telehealth: Payer: Self-pay | Admitting: Emergency Medicine

## 2022-03-29 DIAGNOSIS — N3944 Nocturnal enuresis: Secondary | ICD-10-CM

## 2022-03-29 DIAGNOSIS — M1711 Unilateral primary osteoarthritis, right knee: Secondary | ICD-10-CM

## 2022-03-29 DIAGNOSIS — M179 Osteoarthritis of knee, unspecified: Secondary | ICD-10-CM | POA: Diagnosis not present

## 2022-03-29 LAB — HEMOGLOBIN A1C
Est. average glucose Bld gHb Est-mCnc: 131 mg/dL
Hgb A1c MFr Bld: 6.2 % — ABNORMAL HIGH (ref 4.8–5.6)

## 2022-03-29 LAB — CBC WITH DIFFERENTIAL/PLATELET
Basophils Absolute: 0 10*3/uL (ref 0.0–0.2)
Basos: 1 %
EOS (ABSOLUTE): 0.1 10*3/uL (ref 0.0–0.4)
Eos: 1 %
Hematocrit: 38.9 % (ref 34.0–46.6)
Hemoglobin: 12.3 g/dL (ref 11.1–15.9)
Immature Grans (Abs): 0 10*3/uL (ref 0.0–0.1)
Immature Granulocytes: 0 %
Lymphocytes Absolute: 2.4 10*3/uL (ref 0.7–3.1)
Lymphs: 45 %
MCH: 27.3 pg (ref 26.6–33.0)
MCHC: 31.6 g/dL (ref 31.5–35.7)
MCV: 86 fL (ref 79–97)
Monocytes Absolute: 0.7 10*3/uL (ref 0.1–0.9)
Monocytes: 14 %
Neutrophils Absolute: 2.1 10*3/uL (ref 1.4–7.0)
Neutrophils: 39 %
Platelets: 250 10*3/uL (ref 150–450)
RBC: 4.51 x10E6/uL (ref 3.77–5.28)
RDW: 13 % (ref 11.7–15.4)
WBC: 5.4 10*3/uL (ref 3.4–10.8)

## 2022-03-29 LAB — CERVICOVAGINAL ANCILLARY ONLY
Bacterial Vaginitis (gardnerella): NEGATIVE
Candida Glabrata: NEGATIVE
Candida Vaginitis: NEGATIVE
Chlamydia: NEGATIVE
Comment: NEGATIVE
Comment: NEGATIVE
Comment: NEGATIVE
Comment: NEGATIVE
Comment: NEGATIVE
Comment: NORMAL
Neisseria Gonorrhea: NEGATIVE
Trichomonas: NEGATIVE

## 2022-03-29 LAB — COMPREHENSIVE METABOLIC PANEL
ALT: 15 IU/L (ref 0–32)
AST: 19 IU/L (ref 0–40)
Albumin/Globulin Ratio: 1.4 (ref 1.2–2.2)
Albumin: 4.3 g/dL (ref 3.8–4.8)
Alkaline Phosphatase: 69 IU/L (ref 44–121)
BUN/Creatinine Ratio: 24 (ref 12–28)
BUN: 17 mg/dL (ref 8–27)
Bilirubin Total: 0.3 mg/dL (ref 0.0–1.2)
CO2: 24 mmol/L (ref 20–29)
Calcium: 9.3 mg/dL (ref 8.7–10.3)
Chloride: 100 mmol/L (ref 96–106)
Creatinine, Ser: 0.7 mg/dL (ref 0.57–1.00)
Globulin, Total: 3 g/dL (ref 1.5–4.5)
Glucose: 88 mg/dL (ref 70–99)
Potassium: 4 mmol/L (ref 3.5–5.2)
Sodium: 139 mmol/L (ref 134–144)
Total Protein: 7.3 g/dL (ref 6.0–8.5)
eGFR: 92 mL/min/{1.73_m2} (ref >59)

## 2022-03-29 NOTE — Telephone Encounter (Signed)
Copied from [facility] [id number]. Topic: Referral - Status >> Mar 29, 2022  2:32 PM Cyndi Bender wrote: Reason for CRM: Pt requests referral to a dentist that accepts her insurance. Cb# [phone]

## 2022-03-29 NOTE — Telephone Encounter (Signed)
Copied from [facility] [id number]. Topic: General - Other >> Mar 29, 2022  2:41 PM Ja-Kwan M wrote: Reason for CRM: Pt requests that a Rx for a walker with a seat that has a basket under it be ordered. Pt also requests update on whether the bed liner has been ordered. Cb# [phone]

## 2022-03-30 ENCOUNTER — Ambulatory Visit: Payer: Medicare Other | Attending: Internal Medicine

## 2022-03-30 DIAGNOSIS — Z23 Encounter for immunization: Secondary | ICD-10-CM | POA: Diagnosis not present

## 2022-03-30 NOTE — Progress Notes (Signed)
Patient identified by name and date of birth. Flu shot was given in the left deltoid. Patient tolerated well.

## 2022-04-02 ENCOUNTER — Telehealth: Payer: Self-pay | Admitting: *Deleted

## 2022-04-02 NOTE — Addendum Note (Signed)
Addended by: Milly Jakob on: 04/02/2022 05:22 PM   Modules accepted: Orders

## 2022-04-02 NOTE — Telephone Encounter (Signed)
Orders were created. Pending PCP signature.

## 2022-04-02 NOTE — Telephone Encounter (Signed)
Pt returned call for lab results, message from [facility] understanding.   Prediabetes is stable.  Continue to eat less sugar and white carbohydrates.  Kidney, liver, and electrolyte levels are normal.  Blood count is normal(no anemia).  Thanks, Freeman Caldron, PA-C Your pap smear is normal.  Your next pap smear will be due in 5 years.  2028.  Thanks, Freeman Caldron, PA-C

## 2022-04-03 NOTE — Addendum Note (Signed)
Addended by: Karle Plumber B on: 04/03/2022 08:23 PM   Modules accepted: Orders

## 2022-04-03 NOTE — Telephone Encounter (Signed)
Called and spoke to the patient. She clarified that she needs chucks in 2 1/2 weeks due to being out of town. Please place an order through [employer] since this patient has Medicaid secondary.

## 2022-04-04 NOTE — Telephone Encounter (Signed)
DME order placed on desk.

## 2022-04-10 ENCOUNTER — Ambulatory Visit: Payer: Medicare HMO | Admitting: Nurse Practitioner

## 2022-04-16 ENCOUNTER — Telehealth: Payer: Self-pay | Admitting: Internal Medicine

## 2022-04-16 NOTE — Telephone Encounter (Signed)
Emily [name] w/ [facility] needs the OV notes that specifically state why pt needs a Rolator.  Cb  [phone]  Fax [phone]

## 2022-04-24 NOTE — Telephone Encounter (Signed)
Called patient and she is aware of doctors note

## 2022-05-22 ENCOUNTER — Emergency Department (HOSPITAL_COMMUNITY)
Admission: EM | Admit: 2022-05-22 | Discharge: 2022-05-24 | Disposition: A | Discharge status: Home / Self Care | Payer: 59 | Attending: Emergency Medicine | Admitting: Emergency Medicine

## 2022-05-22 ENCOUNTER — Ambulatory Visit: Payer: Self-pay | Admitting: *Deleted

## 2022-05-22 ENCOUNTER — Other Ambulatory Visit: Payer: Self-pay

## 2022-05-22 ENCOUNTER — Encounter (HOSPITAL_COMMUNITY): Payer: Self-pay | Admitting: Emergency Medicine

## 2022-05-22 ENCOUNTER — Emergency Department (HOSPITAL_COMMUNITY): Payer: 59

## 2022-05-22 DIAGNOSIS — R079 Chest pain, unspecified: Secondary | ICD-10-CM | POA: Diagnosis not present

## 2022-05-22 DIAGNOSIS — Z91040 Latex allergy status: Secondary | ICD-10-CM | POA: Diagnosis not present

## 2022-05-22 DIAGNOSIS — R0789 Other chest pain: Secondary | ICD-10-CM | POA: Diagnosis not present

## 2022-05-22 DIAGNOSIS — J9811 Atelectasis: Secondary | ICD-10-CM | POA: Diagnosis not present

## 2022-05-22 LAB — CBC
HCT: 36.6 % (ref 36.0–46.0)
Hemoglobin: 11.9 g/dL — ABNORMAL LOW (ref 12.0–15.0)
MCH: 27.9 pg (ref 26.0–34.0)
MCHC: 32.5 g/dL (ref 30.0–36.0)
MCV: 85.9 fL (ref 80.0–100.0)
Platelets: 231 10*3/uL (ref 150–400)
RBC: 4.26 MIL/uL (ref 3.87–5.11)
RDW: 14.4 % (ref 11.5–15.5)
WBC: 6.1 10*3/uL (ref 4.0–10.5)
nRBC: 0 % (ref 0.0–0.2)

## 2022-05-22 LAB — BASIC METABOLIC PANEL
Anion gap: 10 (ref 5–15)
BUN: 24 mg/dL — ABNORMAL HIGH (ref 8–23)
CO2: 25 mmol/L (ref 22–32)
Calcium: 9.2 mg/dL (ref 8.9–10.3)
Chloride: 102 mmol/L (ref 98–111)
Creatinine, Ser: 0.83 mg/dL (ref 0.44–1.00)
GFR, Estimated: >60 mL/min (ref >60)
Glucose, Bld: 106 mg/dL — ABNORMAL HIGH (ref 70–99)
Potassium: 4.1 mmol/L (ref 3.5–5.1)
Sodium: 137 mmol/L (ref 135–145)

## 2022-05-22 LAB — TROPONIN I (HIGH SENSITIVITY): Troponin I (High Sensitivity): 4 ng/L (ref <18)

## 2022-05-22 NOTE — ED Triage Notes (Addendum)
Pt c/o pain under left breast that radiates to back  describes as pressure for approx 3 weeks. Denies shortness of breath, weakness, dizziness. Pt denies chest pain at this time.

## 2022-05-22 NOTE — ED Provider Triage Note (Signed)
Emergency Medicine Provider Triage Evaluation Note  Emily [name] , a 72 y.o. female  was evaluated in triage.  Pt complains of chest pain .  Review of Systems  Positive: Pain through to back Negative: fever  Physical Exam  BP 117/85 (BP Location: Right Arm)   Pulse 96   Temp 99.5 F (37.5 C) (Oral)   Resp 16   Ht '5\' 3"'$  (1.6 m)   Wt 63 kg   SpO2 98%   BMI 24.62 kg/m  Gen:   Awake, no distress   Resp:  Normal effort  MSK:   Moves extremities without difficulty  Other:    Medical Decision Making  Medically screening exam initiated at 6:32 PM.  Appropriate orders placed.  MOET MIKULSKI was informed that the remainder of the evaluation will be completed by another provider, this initial triage assessment does not replace that evaluation, and the importance of remaining in the ED until their evaluation is complete.     Fransico Meadow, [place] 05/22/22 [phone]

## 2022-05-22 NOTE — Telephone Encounter (Signed)
Summary: pressure underneath her left breast to her back   Pt stated experiencing pressure underneath her left breast to her back when standing up for a while, going on for about 2-3 weeks. Pt stated pain from 1 to 10 is a 2 or 3. Pt is scheduled for first available on 02/01.  Pt seeking clinical advice.      Chief Complaint: Pressure under left Breast Symptoms: Pressure under left breast "Not in breast" Radiates to back, "Tightness" Duration 30 minutes when occurs throughout day, "Lie down gets better."  Frequency: 3 weeks Pertinent Negatives: Patient denies SOB Disposition: '[x]'$ ED /'[]'$ Urgent Care (no appt availability in office) / '[]'$ Appointment(In office/virtual)/ '[]'$  [facility]/ '[]'$ Home Care/ '[]'$ Refused Recommended Disposition /'[]'$ [facility]/ '[]'$  Follow-up with PCP Additional Notes: Advised ED, pt states will follow disposition. [facility] advise provided, verbalizes understanding.   Reason for Disposition  [1] Chest pain lasts > 5 minutes AND [2] occurred in past 3 days (72 hours) (Exception: Feels exactly the same as previously diagnosed heartburn and has accompanying sour taste in mouth.)  Answer Assessment - Initial Assessment Questions 1. LOCATION: "Where does it hurt?"       Under left breast 2. RADIATION: "Does the pain go anywhere else?" (e.g., into neck, jaw, arms, back)     Radiates to back 3. ONSET: "When did the chest pain begin?" (Minutes, hours or days)      3 weeks ago 4. PATTERN: "Does the pain come and go, or has it been constant since it started?"  "Does it get worse with exertion?"      Comes and goes 5. DURATION: "How long does it last" (e.g., seconds, minutes, hours)     1/2 hour 6. SEVERITY: "How bad is the pain?"  (e.g., Scale 1-10; mild, moderate, or severe)    - MILD (1-3): doesn't interfere with normal activities     - MODERATE (4-7): interferes with normal activities or awakens from sleep    - SEVERE (8-10): excruciating pain, unable to do  any normal activities       1-2/ 10 7. CARDIAC RISK FACTORS: "Do you have any history of heart problems or risk factors for heart disease?" (e.g., angina, prior heart attack; diabetes, high blood pressure, high cholesterol, smoker, or strong family history of heart disease)     *No Answer* 8. PULMONARY RISK FACTORS: "Do you have any history of lung disease?"  (e.g., blood clots in lung, asthma, emphysema, birth control pills)     *No Answer* 9. CAUSE: "What do you think is causing the chest pain?"     "A lot lifting and standing lately." 10. OTHER SYMPTOMS: "Do you have any other symptoms?" (e.g., dizziness, nausea, vomiting, sweating, fever, difficulty breathing, cough)       No  Protocols used: Chest Pain-A-AH

## 2022-05-23 ENCOUNTER — Emergency Department (HOSPITAL_COMMUNITY): Payer: 59

## 2022-05-23 DIAGNOSIS — J9811 Atelectasis: Secondary | ICD-10-CM | POA: Diagnosis not present

## 2022-05-23 DIAGNOSIS — R0789 Other chest pain: Secondary | ICD-10-CM | POA: Diagnosis not present

## 2022-05-23 DIAGNOSIS — R079 Chest pain, unspecified: Secondary | ICD-10-CM | POA: Diagnosis not present

## 2022-05-23 LAB — URINALYSIS, ROUTINE W REFLEX MICROSCOPIC
Bilirubin Urine: NEGATIVE
Glucose, UA: NEGATIVE mg/dL
Hgb urine dipstick: NEGATIVE
Ketones, ur: NEGATIVE mg/dL
Leukocytes,Ua: NEGATIVE
Nitrite: NEGATIVE
Protein, ur: NEGATIVE mg/dL
Specific Gravity, Urine: >1.046 — ABNORMAL HIGH (ref 1.005–1.030)
pH: 7 (ref 5.0–8.0)

## 2022-05-23 LAB — TROPONIN I (HIGH SENSITIVITY): Troponin I (High Sensitivity): 2 ng/L (ref <18)

## 2022-05-23 MED ORDER — IOHEXOL 350 MG/ML SOLN
50.0000 mL | Freq: Once | INTRAVENOUS | Status: AC | PRN
  Administered 2022-05-23: 50 mL via INTRAVENOUS

## 2022-05-23 NOTE — ED Provider Notes (Signed)
[facility] EMERGENCY DEPARTMENT Provider Note   CSN: [id number] Arrival date & time: 05/22/22  1752     History  Chief Complaint  Patient presents with   Chest Pain    Emily [name] is a 72 y.o. female.   Chest Pain Patient presents for intermittent chest pressure over the past several weeks.  Medical history includes anemia, anxiety, bronchitis, tuberculosis, breast fibrocystic disorder, arthritis.  Pressure is typically under her left breast.  At times, it will be on her right lateral chest wall or her thoracic back area.  She denies any pattern to the symptoms.  It does not occur with exertion or postprandially.  She does state that she has had increased life stressors and feels that stress anxiety may be contributing to the symptoms.  When it occurs, she does not have any associated shortness of breath, nausea, diaphoresis.  She also endorses recent urinary frequency.  She currently does not see a cardiologist.  She does feel that she someone back in 2014.  Currently, she is asymptomatic.  She has been asymptomatic throughout the morning.  She arrived in the ED at the request of her doctor, who she spoke with on the phone and informed about the symptoms.     Home Medications Prior to Admission medications   Medication Sig Start Date End Date Taking? Authorizing Provider  Ascorbic Acid (VITAMIN C) 100 MG tablet Take 100 mg by mouth daily.   Yes [provider]  atorvastatin (LIPITOR) 10 MG tablet Take 1 tablet (10 mg total) by mouth daily. 03/28/22  Yes Argentina Donovan, PA-C  cholecalciferol (VITAMIN D3) 25 MCG (1000 UNIT) tablet Take 1,000 Units by mouth daily.   Yes [provider]  cyanocobalamin (CYANOCOBALAMIN) 500 MCG tablet Take 500 mcg by mouth daily.   Yes [provider]  ferrous sulfate 325 (65 FE) MG tablet Take 325 mg by mouth daily with breakfast.   Yes [provider]  glucosamine-chondroitin 500-400 MG tablet  Take 1 tablet by mouth 3 (three) times daily.   Yes [provider]  guaiFENesin (MUCINEX) 600 MG 12 hr tablet Take 600 mg by mouth daily as needed for cough.   Yes [provider]  multivitamin-lutein (OCUVITE-LUTEIN) CAPS capsule Take 1 capsule by mouth daily.   Yes [provider]  VITAMIN E PO Take 1 tablet by mouth daily.   Yes [provider]  cetirizine (ZYRTEC ALLERGY) 10 MG tablet Take 1 tablet (10 mg total) by mouth daily. Patient not taking: Reported on 05/23/2022 12/26/21   Mayers, Loraine Grip, PA-C  Misc. Devices MISC 1.  Bedside commode 2.  Shower chair Diagnosis osteoarthritis of the knee 01/30/22   Charlott Rakes, MD      Allergies    Latex    Review of Systems   Review of Systems  Cardiovascular:  Positive for chest pain.  All other systems reviewed and are negative.   Physical Exam Updated Vital Signs BP 107/85 (BP Location: Right Arm)   Pulse 70   Temp 98 F (36.7 C) (Oral)   Resp 16   Ht '5\' 3"'$  (1.6 m)   Wt 63 kg   SpO2 98%   BMI 24.62 kg/m  Physical Exam Vitals and nursing note reviewed.  Constitutional:      General: She is not in acute distress.    Appearance: She is well-developed. She is not ill-appearing, toxic-appearing or diaphoretic.  HENT:     Head: Normocephalic and atraumatic.  Eyes:     Conjunctiva/sclera: Conjunctivae normal.  Neck:     Vascular: No JVD.  Cardiovascular:     Rate and Rhythm: Normal rate and regular rhythm.     Heart sounds: No murmur heard.    No friction rub.  Pulmonary:     Effort: Pulmonary effort is normal. No respiratory distress.     Breath sounds: Normal breath sounds. No decreased breath sounds, wheezing, rhonchi or rales.  Chest:     Chest wall: No tenderness.  Abdominal:     Palpations: Abdomen is soft.     Tenderness: There is no abdominal tenderness.  Musculoskeletal:        General: No swelling.     Cervical back: Normal range of motion and neck supple.     Right lower  leg: No edema.     Left lower leg: No edema.  Skin:    General: Skin is warm and dry.     Capillary Refill: Capillary refill takes less than 2 seconds.  Neurological:     General: No focal deficit present.     Mental Status: She is alert and oriented to person, place, and time.  Psychiatric:        Mood and Affect: Mood normal.        Behavior: Behavior normal.     ED Results / Procedures / Treatments   Labs (all labs ordered are listed, but only abnormal results are displayed) Labs Reviewed  BASIC METABOLIC PANEL - Abnormal; Notable for the following components:      Result Value   Glucose, Bld 106 (*)    BUN 24 (*)    All other components within normal limits  CBC - Abnormal; Notable for the following components:   Hemoglobin 11.9 (*)    All other components within normal limits  URINALYSIS, ROUTINE W REFLEX MICROSCOPIC - Abnormal; Notable for the following components:   Specific Gravity, Urine >1.046 (*)    All other components within normal limits  TROPONIN I (HIGH SENSITIVITY)  TROPONIN I (HIGH SENSITIVITY)    EKG EKG Interpretation  Date/Time:  Tuesday May 22 2022 18:10:10 EST Ventricular Rate:  97 PR Interval:  148 QRS Duration: 72 QT Interval:  334 QTC Calculation: 424 R Axis:   38 Text Interpretation: Normal sinus rhythm Nonspecific ST and T wave abnormality Abnormal ECG Baseline wander TECHNICALLY DIFFICULT Otherwise no significant change Confirmed by Deno Etienne [id number]) on 05/23/2022 5:02:48 AM  Radiology CT Angio Chest PE W and/or Wo Contrast  Result Date: 05/23/2022 CLINICAL DATA:  Chest pain. EXAM: CT ANGIOGRAPHY CHEST WITH CONTRAST TECHNIQUE: Multidetector CT imaging of the chest was performed using the standard protocol during bolus administration of intravenous contrast. Multiplanar CT image reconstructions and MIPs were obtained to evaluate the vascular anatomy. RADIATION DOSE REDUCTION: This exam was performed according to the departmental  dose-optimization program which includes automated exposure control, adjustment of the mA and/or kV according to patient size and/or use of iterative reconstruction technique. CONTRAST:  72m OMNIPAQUE IOHEXOL 350 MG/ML SOLN COMPARISON:  None Available. FINDINGS: Cardiovascular: Satisfactory opacification of the pulmonary arteries to the segmental level. No evidence of pulmonary embolism. Normal heart size. No pericardial effusion. Mediastinum/Nodes: No enlarged mediastinal, hilar, or axillary lymph nodes. Thyroid gland, trachea, and esophagus demonstrate no significant findings. Lungs/Pleura: Biapical pleural-parenchymal scarring. Dependent atelectasis. No pleural effusion or pneumothorax. Upper Abdomen: There is reflux of contrast into the hepatic veins, which is nonspecific, but could be seen in the setting of cardiac  dysfunction. Musculoskeletal: No chest wall abnormality. No acute or significant osseous findings. Review of the MIP images confirms the above findings. IMPRESSION: 1. No evidence of pulmonary embolism. 2. There is reflux of contrast into the hepatic veins, which is nonspecific, but could be seen in the setting of cardiac dysfunction. Electronically Signed   By: Marin Roberts M.D.   On: 05/23/2022 09:36   DG Chest 2 View  Result Date: 05/22/2022 CLINICAL DATA:  Chest pain. EXAM: CHEST - 2 VIEW COMPARISON:  Chest x-ray April 26, 2021 FINDINGS: The heart size and mediastinal contours are within normal limits. Both lungs are clear. The visualized skeletal structures are unremarkable. IMPRESSION: No active cardiopulmonary disease. Electronically Signed   By: Dorise Bullion III M.D.   On: 05/22/2022 18:56    Procedures Procedures    Medications Ordered in ED Medications  iohexol (OMNIPAQUE) 350 MG/ML injection 50 mL (50 mLs Intravenous Contrast Given 05/23/22 9563)    ED Course/ Medical Decision Making/ A&P                           Medical Decision Making Amount and/or Complexity of  Data Reviewed Labs: ordered. Radiology: ordered.  Risk Prescription drug management.   This patient presents to the ED for concern of chest pain, this involves an extensive number of treatment options, and is a complaint that carries with it a high risk of complications and morbidity.  The differential diagnosis includes ACS, PE, chest wall inflammation, costochondritis, GERD   Co morbidities that complicate the patient evaluation  anemia, anxiety, bronchitis, tuberculosis, breast fibrocystic disorder, arthritis   Additional history obtained:  Additional history obtained from N/A External records from outside source obtained and reviewed including EMR   Lab Tests:  I Ordered, and personally interpreted labs.  The pertinent results include: Baseline anemia, no leukocytosis, no kidney function, normal electrolytes, normal troponins x 2   Imaging Studies ordered:  I ordered imaging studies including chest x-ray, CTA chest I independently visualized and interpreted imaging which showed no acute findings I agree with the radiologist interpretation   Cardiac Monitoring: / EKG:  The patient was maintained on a cardiac monitor.  I personally viewed and interpreted the cardiac monitored which showed an underlying rhythm of: Sinus rhythm  Problem List / ED Course / Critical interventions / Medication management  Patient presents for intermittent left-sided chest pressure over the past several weeks.  Vital signs on arrival are reassuring.  Prior to me bedded in the ED, diagnostic workup was initiated.  Lab work shows normal kidney function, normal electrolytes, normal troponins x 2.  Patient is currently asymptomatic.  She is well-appearing on exam.  There is no associated tenderness.  No rubs, murmurs, or adventitious lung sounds are appreciated on auscultation.  Patient currently does not see a cardiologist but would benefit from follow-up.  She does endorse recent urinary frequency,  so will check urine.  Will also obtain CTA to assess for other etiologies of her recent chest discomfort.  CT shows no acute findings.  There is some reflux of contrast into hepatic veins, patient's symptoms and exam are consistent with heart failure.  Given her intermittent symptoms, patient would benefit from cardiology follow-up.  Cardiology referral was ordered.  Urinalysis showed no evidence of infection.  She remained asymptomatic throughout her stay in the ED.  Patient was discharged in good condition.  Social Determinants of Health:  Has PCP  Final Clinical Impression(s) / ED Diagnoses Final diagnoses:  Chest pain, unspecified type    Rx / DC Orders ED Discharge Orders          Ordered    Ambulatory referral to Cardiology       Comments: If you have not heard from the Cardiology office within the next 72 hours please call [phone].   05/23/22 1226              Godfrey Pick, MD 05/23/22 1229

## 2022-05-23 NOTE — ED Notes (Signed)
Pt is asleep

## 2022-05-23 NOTE — ED Notes (Signed)
Placed patient on cardiac monitor.

## 2022-05-23 NOTE — ED Notes (Signed)
Patient Alert and oriented to baseline. Stable and ambulatory to baseline. Patient verbalized understanding of the discharge instructions.  Patient belongings were taken by the patient.   

## 2022-05-23 NOTE — Discharge Instructions (Signed)
You should follow-up with the cardiologist office.  You can expect a call from them to set up that appointment.  If you do not hear from them in the next 2 days, call the number below.  Return to the emergency department at anytime for any new or worsening symptoms of concern.

## 2022-05-31 ENCOUNTER — Ambulatory Visit: Payer: Medicare HMO | Admitting: Internal Medicine

## 2022-06-14 ENCOUNTER — Ambulatory Visit: Payer: Self-pay | Admitting: Physician Assistant

## 2022-06-26 ENCOUNTER — Ambulatory Visit: Payer: 59 | Attending: Internal Medicine | Admitting: Internal Medicine

## 2022-06-26 ENCOUNTER — Encounter: Payer: Self-pay | Admitting: Internal Medicine

## 2022-06-26 VITALS — BP 100/63 | HR 78 | Temp 98.1°F | Ht 63.0 in | Wt 144.0 lb

## 2022-06-26 DIAGNOSIS — L659 Nonscarring hair loss, unspecified: Secondary | ICD-10-CM

## 2022-06-26 DIAGNOSIS — E782 Mixed hyperlipidemia: Secondary | ICD-10-CM | POA: Diagnosis not present

## 2022-06-26 DIAGNOSIS — N898 Other specified noninflammatory disorders of vagina: Secondary | ICD-10-CM

## 2022-06-26 NOTE — Progress Notes (Unsigned)
Cardiology Office Note:    Date:  06/27/2022   ID:  Emily [name], DOB Apr 05, 1951, MRN [id number]  PCP:  Ladell Pier, MD  [place] Providers Cardiologist:  Candee Furbish, MD    Referring MD: Godfrey Pick, MD   Patient Profile: Pre-diabetes  Hyperlipidemia  Chest pain  ETT Echocardiogram 11/14/10: normal       History of Present Illness:   Emily [name] is a 72 y.o. female with the above problem list.  She was last seen by Dr. Marlou Porch in 2021. A CCTA was recommended to workup chest pain but never done. She was just seen in the ED 05/22/22 for chest pain. EKG, labs were personally reviewed. Her hsTrops were neg x 2. EKG showed no acute STTW changes. CXR was normal. CT was neg for PE. She returns for evaluation of chest pain. She is here alone. She notes substernal chest pressure from time to time. It can come on at rest. She does not necessarily have it with exertion. She has not had associated arm/jaw pain, nausea, diaphoresis. She has not had dyspnea on exertion, orthopnea, leg edema, syncope.    Subjective    Reviewed and updated this encounter:   Tobacco  Allergies  Meds  Problems  Med Hx  Surg Hx  Fam Hx     ROS   Objective   Labs/Other Test Reviewed:   Recent Labs: 03/28/2022: ALT 15 05/22/2022: BUN 24; Creatinine, Ser 0.83; Hemoglobin 11.9; Platelets 231; Potassium 4.1; Sodium 137 06/26/2022: TSH 0.542   Recent Lipid Panel Recent Labs    11/28/21 1450  CHOL 170  TRIG 66  HDL 87  LDLCALC 70     Risk Assessment/Calculations/Metrics:             Physical Exam:   VS:  BP 110/70   Pulse 76   Ht 5' 3"$  (1.6 m)   Wt 146 lb 12.8 oz (66.6 kg)   SpO2 98%   BMI 26.00 kg/m    Wt Readings from Last 3 Encounters:  06/27/22 146 lb 12.8 oz (66.6 kg)  06/26/22 144 lb (65.3 kg)  05/22/22 139 lb (63 kg)    Constitutional:      Appearance: Healthy appearance. Not in distress.  Neck:     Vascular: No carotid bruit. JVD normal.  Pulmonary:     Effort:  Pulmonary effort is normal.     Breath sounds: No wheezing. No rales.  Cardiovascular:     Normal rate. Regular rhythm. Normal S1. Normal S2.      Murmurs: There is no murmur.  Edema:    Peripheral edema absent.  Abdominal:     Palpations: Abdomen is soft.     Assessment & Plan    ASSESSMENT & PLAN:   Precordial chest pain She was seen in 2021 with chest pain. CCTA was recommended but not done. She was recently seen in the ED for chest pain with a neg workup. We discussed options for ischemic testing to include nuclear stress testing vs coronary CTA. I favor a CCTA for definitive evaluation. She agrees. There was reflux in the hepatic veins on her CT. She is not having any symptoms of CHF. However, since she is having chest pain, I will also get an echocardiogram. Arrange CCTA.  Arrange echocardiogram  F/u w Dr. Marlou Porch as needed or sooner if testing is abnormal.            Dispo:  Return for follow up as needed depending  upon results of CT/Echo.   Signed, Richardson Dopp, PA-C  06/27/2022 8:37 AM    [facility] [place], [place], [place]  [address] Phone: [phone]; Fax: [phone]

## 2022-06-26 NOTE — Progress Notes (Signed)
Patient ID: Emily [name], female    DOB: [birth year]  MRN: [id number]  CC: Hyperlipidemia (Hyperlipidemia f/u. Orion Crook back of neck, irritation X2 years./Boil on inner side of vagina. Scherrie Bateman received flu vax. )   Subjective: Emily [name] is a 72 y.o. female who presents for chronic ds management Her concerns today include:  Patient with history of prediabetes, anxiety, fibrocystic breast, HL, iron deficiency, COVID-19 infection 02/2020, TMJ, trigeminal neuralgia  HM:  had 2nd shingles vac at [facility]  HL:  taking and tolerating Lipitor.  Last LDL 11/2021 was 70.  C/o thinning of hair posterior neck line and moving up the scalp x several yrs Hair has been natural x past 10 yrs.  Prior to that she used relaxers.  Washes hair every couple mths.   Keeps hair covered all the time.   C/o having boil on vagina.  There since last yr but could not find it the last time she reported it to me 01/2021.  No vaginal lesions were seen at that time.    Anemia: takes iron  but not consistently.  Last H/H 11.9/36.6.  Reports she had stopped taking the iron but since restarted but not every day. Patient Active Problem List   Diagnosis Date Noted   Primary osteoarthritis of right knee 07/15/2021   Influenza vaccine refused 06/13/2020   Dermatitis 06/13/2020   Mixed hyperlipidemia 06/13/2020   Prediabetes 11/21/2012   Breast fibrocystic disorder 11/21/2012   Chest pain 11/21/2012   Anxiety state, unspecified 11/21/2012   Incidental lung nodule, > 20m and < 863m10/08/2011     Current Outpatient Medications on File Prior to Visit  Medication Sig Dispense Refill   Ascorbic Acid (VITAMIN C) 100 MG tablet Take 100 mg by mouth daily.     atorvastatin (LIPITOR) 10 MG tablet Take 1 tablet (10 mg total) by mouth daily. 90 tablet 1   cetirizine (ZYRTEC ALLERGY) 10 MG tablet Take 1 tablet (10 mg total) by mouth daily. 30 tablet 11   cholecalciferol (VITAMIN D3) 25 MCG (1000 UNIT) tablet  Take 1,000 Units by mouth daily.     cyanocobalamin (CYANOCOBALAMIN) 500 MCG tablet Take 500 mcg by mouth daily.     ferrous sulfate 325 (65 FE) MG tablet Take 325 mg by mouth daily with breakfast.     glucosamine-chondroitin 500-400 MG tablet Take 1 tablet by mouth 3 (three) times daily.     guaiFENesin (MUCINEX) 600 MG 12 hr tablet Take 600 mg by mouth daily as needed for cough.     Misc. Devices MISC 1.  Bedside commode 2.  Shower chair Diagnosis osteoarthritis of the knee 1 each 0   multivitamin-lutein (OCUVITE-LUTEIN) CAPS capsule Take 1 capsule by mouth daily.     VITAMIN E PO Take 1 tablet by mouth daily.     No current facility-administered medications on file prior to visit.    Allergies  Allergen Reactions   Latex Itching and Rash    Social History   Socioeconomic History   Marital status: Divorced    Spouse name: Not on file   Number of children: 2   Years of education: Not on file   Highest education level: Some college, no degree  Occupational History   Occupation: Day-care  Tobacco Use   Smoking status: Never   Smokeless tobacco: Never  Vaping Use   Vaping Use: Never used  Substance and Sexual Activity   Alcohol use: No   Drug use: No  Sexual activity: Never  Other Topics Concern   Not on file  Social History Narrative   Not on file   Social Determinants of Health   Financial Resource Strain: Not on file  Food Insecurity: Not on file  Transportation Needs: Not on file  Physical Activity: Not on file  Stress: Not on file  Social Connections: Not on file  Intimate Partner Violence: Not on file    Family History  Problem Relation Age of Onset   Diabetes Brother    Hypertension Brother    Alcohol abuse Brother    Mental illness Brother        nervous breakdown   Stroke Maternal Grandmother    Depression Paternal Grandmother    Arthritis Mother    COPD Father 55       decsd due to lack of oxygen   Hypertension Father    Alcohol abuse Father     Gout Father    Alcohol abuse Brother    Hypertension Brother     Past Surgical History:  Procedure Laterality Date   BREAST BIOPSY Right 05/28/2016   DENSE STROMAL FIBROSIS    COLONOSCOPY WITH PROPOFOL N/A 04/24/2016   Procedure: COLONOSCOPY WITH PROPOFOL;  Surgeon: Garlan Fair, MD;  Location: [facility];  Service: Endoscopy;  Laterality: N/A;   EYE SURGERY Bilateral    lens replacements   FRACTURE SURGERY Right 2003   fibula   surgery for fibroids  2000    ROS: Review of Systems Negative except as stated above  PHYSICAL EXAM: BP 100/63 (BP Location: Left Arm, Patient Position: Sitting, Cuff Size: Normal)   Pulse 78   Temp 98.1 F (36.7 C) (Oral)   Ht 5' 3"$  (1.6 m)   Wt 144 lb (65.3 kg)   SpO2 98%   BMI 25.51 kg/m   Physical Exam  General appearance - alert, well appearing, elderly AAF and in no distress Mental status - normal mood, behavior, speech, dress, motor activity, and thought processes Chest - clear to auscultation, no wheezes, rales or rhonchi, symmetric air entry Heart - normal rate, regular rhythm, normal S1, S2, no murmurs, rubs, clicks or gallops Pelvic - CMA Clarrisa present: small , less than 1 cm firm nodule seen LT labia Skin -Removed from head.  She has her hair in a ponytail that sticks up on top.  Here in the ponytail is matted down.  Hair on half the scalp posteriorly is matted down.  Thin hair noted around the hairline anteriorly and posteriorly      Latest Ref Rng & Units 05/22/2022    7:00 PM 03/28/2022    3:46 PM 11/28/2021    2:50 PM  CMP  Glucose 70 - 99 mg/dL 106  88  122   BUN 8 - 23 mg/dL 24  17  16   $ Creatinine 0.44 - 1.00 mg/dL 0.83  0.70  0.66   Sodium 135 - 145 mmol/L 137  139  143   Potassium 3.5 - 5.1 mmol/L 4.1  4.0  4.0   Chloride 98 - 111 mmol/L 102  100  105   CO2 22 - 32 mmol/L 25  24  24   $ Calcium 8.9 - 10.3 mg/dL 9.2  9.3  9.4   Total Protein 6.0 - 8.5 g/dL  7.3  6.9   Total Bilirubin 0.0 - 1.2 mg/dL  0.3   <0.2   Alkaline Phos 44 - 121 IU/L  69  68   AST 0 - 40  IU/L  19  21   ALT 0 - 32 IU/L  15  18    Lipid Panel     Component Value Date/Time   CHOL 170 11/28/2021 1450   TRIG 66 11/28/2021 1450   HDL 87 11/28/2021 1450   CHOLHDL 2.0 11/28/2021 1450   CHOLHDL 2.1 02/23/2016 0918   VLDL 8 02/23/2016 0918   LDLCALC 70 11/28/2021 1450    CBC    Component Value Date/Time   WBC 6.1 05/22/2022 1900   RBC 4.26 05/22/2022 1900   HGB 11.9 (L) 05/22/2022 1900   HGB 12.3 03/28/2022 1546   HCT 36.6 05/22/2022 1900   HCT 38.9 03/28/2022 1546   PLT 231 05/22/2022 1900   PLT 250 03/28/2022 1546   MCV 85.9 05/22/2022 1900   MCV 86 03/28/2022 1546   MCH 27.9 05/22/2022 1900   MCHC 32.5 05/22/2022 1900   RDW 14.4 05/22/2022 1900   RDW 13.0 03/28/2022 1546   LYMPHSABS 2.4 03/28/2022 1546   MONOABS 484 08/18/2015 1050   EOSABS 0.1 03/28/2022 1546   BASOSABS 0.0 03/28/2022 1546    ASSESSMENT AND PLAN:  1. Mixed hyperlipidemia Continue atorvastatin.  2. Thinning hair Encourage patient to wash her hair at least once a week or once every 2 weeks followed by conditioner each time. - Ambulatory referral to Dermatology - TSH+T4F+T3Free  3. Vaginal lesion Does not look too concerning but will refer to gynecology to take a look at this. - Ambulatory referral to Gynecology    Patient was given the opportunity to ask questions.  Patient verbalized understanding of the plan and was able to repeat key elements of the plan.   This documentation was completed using Radio producer.  Any transcriptional errors are unintentional.  No orders of the defined types were placed in this encounter.    Requested Prescriptions    No prescriptions requested or ordered in this encounter    No follow-ups on file.  Karle Plumber, MD, FACP

## 2022-06-27 ENCOUNTER — Encounter: Payer: Self-pay | Admitting: Physician Assistant

## 2022-06-27 ENCOUNTER — Ambulatory Visit: Payer: 59 | Attending: Physician Assistant | Admitting: Physician Assistant

## 2022-06-27 VITALS — BP 110/70 | HR 76 | Ht 63.0 in | Wt 146.8 lb

## 2022-06-27 DIAGNOSIS — R072 Precordial pain: Secondary | ICD-10-CM | POA: Diagnosis not present

## 2022-06-27 DIAGNOSIS — I200 Unstable angina: Secondary | ICD-10-CM

## 2022-06-27 LAB — BASIC METABOLIC PANEL
BUN/Creatinine Ratio: 23 (ref 12–28)
BUN: 15 mg/dL (ref 8–27)
CO2: 25 mmol/L (ref 20–29)
Calcium: 9.2 mg/dL (ref 8.7–10.3)
Chloride: 102 mmol/L (ref 96–106)
Creatinine, Ser: 0.65 mg/dL (ref 0.57–1.00)
Glucose: 99 mg/dL (ref 70–99)
Potassium: 4.1 mmol/L (ref 3.5–5.2)
Sodium: 141 mmol/L (ref 134–144)
eGFR: 93 mL/min/{1.73_m2} (ref >59)

## 2022-06-27 LAB — TSH+T4F+T3FREE
Free T4: 1.09 ng/dL (ref 0.82–1.77)
T3, Free: 2.8 pg/mL (ref 2.0–4.4)
TSH: 0.542 u[IU]/mL (ref 0.450–4.500)

## 2022-06-27 MED ORDER — METOPROLOL TARTRATE 50 MG PO TABS: ORAL_TABLET | ORAL | 0 refills | Status: DC

## 2022-06-27 NOTE — Patient Instructions (Signed)
Medication Instructions:  Your physician recommends that you continue on your current medications as directed. Please refer to the Current Medication list given to you today.  *If you need a refill on your cardiac medications before your next appointment, please call your pharmacy*   Lab Work: TODAY:  BMET  If you have labs (blood work) drawn today and your tests are completely normal, you will receive your results only by: [place] (if you have MyChart) OR A paper copy in the mail If you have any lab test that is abnormal or we need to change your treatment, we will call you to review the results.   Testing/Procedures: Your physician has requested that you have an echocardiogram. Echocardiography is a painless test that uses sound waves to create images of your heart. It provides your doctor with information about the size and shape of your heart and how well your heart's chambers and valves are working. This procedure takes approximately one hour. There are no restrictions for this procedure. Please do NOT wear cologne, perfume, aftershave, or lotions (deodorant is allowed). Please arrive 15 minutes prior to your appointment time.    Your physician has requested that you have cardiac CT. Cardiac computed tomography (CT) is a painless test that uses an x-ray machine to take clear, detailed pictures of your heart. For further information please visit [email or web address]. Please follow instruction sheet BELOW:    Your cardiac CT will be scheduled at one of the below locations:   [facility] [address] [place], [place] [address] (336) [place] [address] [place], [place] [address] [phone]  [facility] [place],  [address] [phone]  If scheduled at [facility], please arrive at the [facility] Entrance  (Entrance C2) of [facility] 30 minutes prior to test start time. You can use the FREE valet parking offered at entrance C (encouraged to control the heart rate for the test)  Proceed to the [facility] Radiology Department (first floor) to check-in and test prep.  All radiology patients and guests should use entrance C2 at [facility], accessed from [facility], even though the hospital's physical address listed is [address]    If scheduled at [facility] or [facility], please arrive 15 mins early for check-in and test prep.   Please follow these instructions carefully (unless otherwise directed):  Hold all erectile dysfunction medications at least 3 days (72 hrs) prior to test. (Ie viagra, cialis, sildenafil, tadalafil, etc) We will administer nitroglycerin during this exam.   On the Night Before the Test: Be sure to Drink plenty of water. Do not consume any caffeinated/decaffeinated beverages or chocolate 12 hours prior to your test. Do not take any antihistamines 12 hours prior to your test.   On the Day of the Test: Drink plenty of water until 1 hour prior to the test. Do not eat any food 1 hour prior to test. You may take your regular medications prior to the test.  Take metoprolol (Lopressor) 50 MG two hours prior to test.  THIS HAS BEEN SEN TO [facility]- please wear underwire-free bra if available, avoid dresses & tight clothing      After the Test: Drink plenty of water. After receiving IV contrast, you may experience a mild flushed feeling. This is normal. On occasion, you may  experience a mild rash up to 24 hours after the test. This is not dangerous. If this occurs, you can take Benadryl 25 mg and increase your fluid intake. If you experience trouble breathing, this can be serious. If it is severe call 911 IMMEDIATELY. If it is mild, please call our office.   We will call to  schedule your test 2-4 weeks out understanding that some insurance companies will need an authorization prior to the service being performed.   For non-scheduling related questions, please contact the cardiac imaging nurse navigator should you have any questions/concerns: Marchia Bond, Cardiac Imaging Nurse Navigator Gordy Clement, Cardiac Imaging Nurse Navigator [facility] Direct Office Dial: [phone]   For scheduling needs, including cancellations and rescheduling, please call [place], [phone].     Follow-Up: At [facility], you and your health needs are our priority.  As part of our continuing mission to provide you with exceptional heart care, we have created designated Provider Care Teams.  These Care Teams include your primary Cardiologist (physician) and Advanced Practice Providers (APPs -  Physician Assistants and Nurse Practitioners) who all work together to provide you with the care you need, when you need it.  We recommend signing up for the patient portal called "MyChart".  Sign up information is provided on this After Visit Summary.  MyChart is used to connect with patients for Virtual Visits (Telemedicine).  Patients are able to view lab/test results, encounter notes, upcoming appointments, etc.  Non-urgent messages can be sent to your provider as well.   To learn more about what you can do with MyChart, go to [email or web address].    Your next appointment:   AS NEEDED   Provider:   Candee Furbish, MD     Other Instructions

## 2022-06-27 NOTE — Assessment & Plan Note (Signed)
She was seen in 2021 with chest pain. CCTA was recommended but not done. She was recently seen in the ED for chest pain with a neg workup. We discussed options for ischemic testing to include nuclear stress testing vs coronary CTA. I favor a CCTA for definitive evaluation. She agrees. There was reflux in the hepatic veins on her CT. She is not having any symptoms of CHF. However, since she is having chest pain, I will also get an echocardiogram. Arrange CCTA.  Arrange echocardiogram  F/u w Dr. Marlou Porch as needed or sooner if testing is abnormal.

## 2022-06-29 ENCOUNTER — Ambulatory Visit: Payer: 59 | Admitting: Obstetrics & Gynecology

## 2022-07-02 NOTE — Progress Notes (Signed)
Pt has been made aware of normal result and verbalized understanding.  jw

## 2022-07-04 ENCOUNTER — Ambulatory Visit (INDEPENDENT_AMBULATORY_CARE_PROVIDER_SITE_OTHER): Payer: 59 | Admitting: Obstetrics & Gynecology

## 2022-07-04 ENCOUNTER — Encounter: Payer: Self-pay | Admitting: Obstetrics & Gynecology

## 2022-07-04 VITALS — BP 108/64 | HR 84

## 2022-07-04 DIAGNOSIS — N764 Abscess of vulva: Secondary | ICD-10-CM | POA: Diagnosis not present

## 2022-07-04 NOTE — Progress Notes (Signed)
    Emily [name] [birth year] [id number]        71 y.o.  G2P2L2   RP: Vulvar boil  HPI: Patient reports a left vulvar boil which resolved a few weeks ago.  No pus, no bleeding.  No pelvic pain.  Abstinent.  No h/o abnormal Pap.  Last Pap Neg in 2015.     OB History  Gravida Para Term Preterm AB Living  2 2 2     2  $ SAB IAB Ectopic Multiple Live Births          2    # Outcome Date GA Lbr Len/2nd Weight Sex Delivery Anes PTL Lv  2 Term           1 Term             Past medical history,surgical history, problem list, medications, allergies, family history and social history were all reviewed and documented in the EPIC chart.   Directed ROS with pertinent positives and negatives documented in the history of present illness/assessment and plan.  Exam:  Vitals:   07/04/22 0815  BP: 108/64  Pulse: 84  SpO2: 99%   General appearance:  Normal  Gynecologic exam: Vulva:  Left mid vulva labia majora pinpoint opening at site of previous boil per patient.  No cyst, boil, nodule currently.  No erythema.  No tenderness.  Bimanual exam:  Uterus AV, normal volume, NT, mobile.  Cervix normal.  No adnexal mass felt, NT bilaterally.   Assessment/Plan:  72 y.o. G2P2002   1. Vulvar boil  Resolved vulvar boil, no sign of infection.  Gyn exam with bimanual exam Negative.  Counseling done, warm soaking PRN.  Patient reassured.  Princess Bruins MD, 8:48 AM 07/04/2022

## 2022-07-05 ENCOUNTER — Ambulatory Visit (HOSPITAL_COMMUNITY): Payer: 59

## 2022-07-09 ENCOUNTER — Telehealth (HOSPITAL_COMMUNITY): Payer: Self-pay | Admitting: *Deleted

## 2022-07-09 ENCOUNTER — Other Ambulatory Visit (HOSPITAL_COMMUNITY): Payer: Self-pay | Admitting: *Deleted

## 2022-07-09 MED ORDER — METOPROLOL TARTRATE 50 MG PO TABS: ORAL_TABLET | ORAL | 0 refills | Status: DC

## 2022-07-09 NOTE — Telephone Encounter (Signed)
Reaching out to patient to offer assistance regarding upcoming cardiac imaging study; pt verbalizes understanding of appt date/time, parking situation and where to check in, pre-test NPO status and medications ordered, and verified current allergies; name and call back number provided for further questions should they arise  Gordy Clement RN Navigator Cardiac Imaging [facility] [phone] office [phone] cell  Patient to take '50mg'$  metoprolol tartrate two hours prior to her cardiac CT scan. She is aware to arrive at 1pm.

## 2022-07-10 ENCOUNTER — Ambulatory Visit (HOSPITAL_COMMUNITY): Admission: RE | Admit: 2022-07-10 | Payer: 59 | Source: Ambulatory Visit

## 2022-07-12 ENCOUNTER — Other Ambulatory Visit (HOSPITAL_COMMUNITY): Payer: Self-pay | Admitting: *Deleted

## 2022-07-12 ENCOUNTER — Encounter (HOSPITAL_COMMUNITY): Payer: Self-pay

## 2022-07-12 ENCOUNTER — Ambulatory Visit (HOSPITAL_COMMUNITY)
Admission: RE | Admit: 2022-07-12 | Discharge: 2022-07-12 | Disposition: A | Discharge status: Home / Self Care | Payer: 59 | Source: Ambulatory Visit | Attending: Physician Assistant | Admitting: Physician Assistant

## 2022-07-12 DIAGNOSIS — I200 Unstable angina: Secondary | ICD-10-CM

## 2022-07-12 MED ORDER — IVABRADINE HCL 7.5 MG PO TABS: ORAL_TABLET | ORAL | 0 refills | Status: DC

## 2022-07-12 MED ORDER — NITROGLYCERIN 0.4 MG SL SUBL: 0.8000 mg | SUBLINGUAL_TABLET | Freq: Once | SUBLINGUAL | Status: DC

## 2022-07-12 MED ORDER — METOPROLOL TARTRATE 50 MG PO TABS: ORAL_TABLET | ORAL | 0 refills | Status: DC

## 2022-07-12 NOTE — Progress Notes (Signed)
Called and spoke to Dr. Gasper Sells. Patient's heart rate is elevated and blood pressure is soft.  The lowest patient's heart rate with a breath hold was 78 bpm.  MD stated if we gave medications to lower heart rate via IV then the blood pressure would be too low for the nitroglycerin that is needed for the scan.  MD would like navigators to be notified and patient rescheduled with Ivabradine. Notified navigators.

## 2022-07-16 ENCOUNTER — Ambulatory Visit (HOSPITAL_COMMUNITY): Admission: RE | Admit: 2022-07-16 | Payer: 59 | Source: Ambulatory Visit

## 2022-07-20 DIAGNOSIS — E119 Type 2 diabetes mellitus without complications: Secondary | ICD-10-CM | POA: Diagnosis not present

## 2022-07-20 DIAGNOSIS — H11153 Pinguecula, bilateral: Secondary | ICD-10-CM | POA: Diagnosis not present

## 2022-07-20 DIAGNOSIS — H02831 Dermatochalasis of right upper eyelid: Secondary | ICD-10-CM | POA: Diagnosis not present

## 2022-07-20 DIAGNOSIS — Z961 Presence of intraocular lens: Secondary | ICD-10-CM | POA: Diagnosis not present

## 2022-07-20 LAB — HM DIABETES EYE EXAM

## 2022-07-23 ENCOUNTER — Telehealth (HOSPITAL_COMMUNITY): Payer: Self-pay | Admitting: *Deleted

## 2022-07-23 NOTE — Telephone Encounter (Signed)
Attempted to call patient regarding upcoming cardiac CT appointment. °Left message on voicemail with name and callback number ° °Raneem Mendolia RN Navigator Cardiac Imaging °[facility] °[phone] Office °[phone] Cell ° °

## 2022-07-24 ENCOUNTER — Ambulatory Visit (HOSPITAL_COMMUNITY)
Admission: RE | Admit: 2022-07-24 | Discharge: 2022-07-24 | Disposition: A | Discharge status: Home / Self Care | Payer: 59 | Source: Ambulatory Visit | Attending: Physician Assistant | Admitting: Physician Assistant

## 2022-07-24 DIAGNOSIS — I200 Unstable angina: Secondary | ICD-10-CM

## 2022-07-24 MED ORDER — NITROGLYCERIN 0.4 MG SL SUBL
SUBLINGUAL_TABLET | SUBLINGUAL | Status: AC
  Administered 2022-07-24: 0.8 mg via SUBLINGUAL
  Filled 2022-07-24: qty 2

## 2022-07-24 MED ORDER — IOHEXOL 350 MG/ML SOLN
100.0000 mL | Freq: Once | INTRAVENOUS | Status: AC | PRN
  Administered 2022-07-24: 100 mL via INTRAVENOUS

## 2022-07-24 MED ORDER — METOPROLOL TARTRATE 5 MG/5ML IV SOLN
INTRAVENOUS | Status: AC
  Filled 2022-07-24: qty 10

## 2022-07-24 MED ORDER — NITROGLYCERIN 0.4 MG SL SUBL: 0.8000 mg | SUBLINGUAL_TABLET | Freq: Once | SUBLINGUAL | Status: AC

## 2022-07-24 MED ORDER — METOPROLOL TARTRATE 5 MG/5ML IV SOLN
10.0000 mg | INTRAVENOUS | Status: DC | PRN
  Administered 2022-07-24: 5 mg via INTRAVENOUS

## 2022-07-25 ENCOUNTER — Telehealth: Payer: Self-pay | Admitting: Internal Medicine

## 2022-07-25 ENCOUNTER — Encounter: Payer: Self-pay | Admitting: Physician Assistant

## 2022-07-25 ENCOUNTER — Other Ambulatory Visit: Payer: Self-pay

## 2022-07-25 NOTE — Telephone Encounter (Signed)
Called & spoke to the patient. Verified name & DOB. Informed that there is an echocardiogram scheduled tomorrow at [address]. Patient expressed verbal understanding. No further questions at this time.

## 2022-07-25 NOTE — Telephone Encounter (Signed)
Copied from [place] [id number]. Topic: General - Other >> Jul 25, 2022  9:16 AM Chapman Fitch wrote: Reason for CRM: [employer] called pt this morning and advised her she was approved for 3D or 4D Xray/ pt is asking what this is for and when this is scheduled / pt asked if this was for her appt tomorrow for EKG/ please advise

## 2022-07-25 NOTE — Progress Notes (Signed)
Pt has been made aware of normal result and verbalized understanding.  jw

## 2022-07-26 ENCOUNTER — Ambulatory Visit (HOSPITAL_COMMUNITY): Payer: 59 | Attending: Cardiovascular Disease

## 2022-07-26 DIAGNOSIS — R072 Precordial pain: Secondary | ICD-10-CM

## 2022-07-26 DIAGNOSIS — I200 Unstable angina: Secondary | ICD-10-CM

## 2022-07-26 LAB — ECHOCARDIOGRAM COMPLETE
Area-P 1/2: 3.53 cm2
MV M vel: 4.25 m/s
MV Peak grad: 72.3 mmHg
Radius: 0.4 cm
S' Lateral: 2.45 cm

## 2022-07-27 ENCOUNTER — Ambulatory Visit: Payer: Medicare Other | Admitting: Internal Medicine

## 2022-07-27 NOTE — Progress Notes (Signed)
Pt has been made aware of normal result and verbalized understanding.  jw

## 2022-08-27 ENCOUNTER — Telehealth: Payer: Self-pay | Admitting: Internal Medicine

## 2022-08-27 NOTE — Telephone Encounter (Signed)
Copied from CRM [id number]. Topic: General - Inquiry >> Aug 27, 2022 11:40 AM Marlow Baars wrote: Reason for CRM: [place] Case Navigator with [facility] called in on behalf of the patient to check on the status of a previous request in which the patient states she spoke to her provider about getting a Rolator walker. Please assist patient further

## 2022-09-13 ENCOUNTER — Other Ambulatory Visit: Payer: Self-pay

## 2022-09-13 DIAGNOSIS — M1711 Unilateral primary osteoarthritis, right knee: Secondary | ICD-10-CM

## 2022-09-17 NOTE — Telephone Encounter (Signed)
Called & spoke to the patient. Verified name & DOB. Scheduled appointment for 09/27/2022. Patient confirmed day and time.

## 2022-09-21 ENCOUNTER — Other Ambulatory Visit: Payer: Self-pay | Admitting: Physician Assistant

## 2022-09-21 DIAGNOSIS — E782 Mixed hyperlipidemia: Secondary | ICD-10-CM

## 2022-09-27 ENCOUNTER — Encounter: Payer: Self-pay | Admitting: Internal Medicine

## 2022-09-27 ENCOUNTER — Ambulatory Visit: Payer: 59 | Attending: Internal Medicine | Admitting: Internal Medicine

## 2022-09-27 VITALS — BP 103/65 | HR 83 | Temp 98.0°F | Ht 63.0 in | Wt 147.0 lb

## 2022-09-27 DIAGNOSIS — M1711 Unilateral primary osteoarthritis, right knee: Secondary | ICD-10-CM

## 2022-09-27 DIAGNOSIS — L309 Dermatitis, unspecified: Secondary | ICD-10-CM

## 2022-09-27 NOTE — Progress Notes (Signed)
Patient ID: Emily [name], female    DOB: 10/14/1950  MRN: [id number]  CC: Paperwork (Evaluation for rollator / walker. )   Subjective: Emily [name] is a 72 y.o. female who presents for eval for rollator walker Her concerns today include:  Patient with history of prediabetes, anxiety, fibrocystic breast, HL, iron deficiency, COVID-19 infection 02/2020, TMJ, trigeminal neuralgia   Pt would like to get rollator walker with seat.   She has know significant OA of RT knee. Saw Dr. Roda Shutters last yr for same.  Had surgery on the knee in 2003.  Knee gets swollen and givens out when she walks less than a block. No falls. Has a cane but feels she loses strength in leg/knee when she stands  for more than a few minutes like when she has to stand in line at the  food pantry.    She has noticed some red spots on the lower legs below the knee more so on the left side times several months.  They are not painful and mainly over the varicose veins.  Patient Active Problem List   Diagnosis Date Noted   Primary osteoarthritis of right knee 07/15/2021   Influenza vaccine refused 06/13/2020   Dermatitis 06/13/2020   Mixed hyperlipidemia 06/13/2020   Prediabetes 11/21/2012   Breast fibrocystic disorder 11/21/2012   Precordial chest pain 11/21/2012   Anxiety state, unspecified 11/21/2012   Incidental lung nodule, > 3mm and < 8mm 02/15/2012     Current Outpatient Medications on File Prior to Visit  Medication Sig Dispense Refill   Ascorbic Acid (VITAMIN C) 100 MG tablet Take 100 mg by mouth daily.     atorvastatin (LIPITOR) 10 MG tablet TAKE 1 TABLET BY MOUTH EVERY DAY 90 tablet 0   cholecalciferol (VITAMIN D3) 25 MCG (1000 UNIT) tablet Take 1,000 Units by mouth daily.     cyanocobalamin (CYANOCOBALAMIN) 500 MCG tablet Take 500 mcg by mouth daily.     ferrous sulfate 325 (65 FE) MG tablet Take 325 mg by mouth daily with breakfast.     glucosamine-chondroitin 500-400 MG tablet Take 1 tablet by mouth daily.      ivabradine (CORLANOR) 7.5 MG TABS tablet Take tablets (15mg ) TWO hours prior to your cardiac CT scan. 2 tablet 0   metoprolol tartrate (LOPRESSOR) 50 MG tablet TAKE 1 TABLET BY MOUTH 2 HOURS PRIOR TO THE CARDIAC CT 1 tablet 0   multivitamin-lutein (OCUVITE-LUTEIN) CAPS capsule Take 1 capsule by mouth daily.     VITAMIN E PO Take 1 tablet by mouth daily.     No current facility-administered medications on file prior to visit.    Allergies  Allergen Reactions   Latex Itching and Rash    Social History   Socioeconomic History   Marital status: Divorced    Spouse name: Not on file   Number of children: 2   Years of education: Not on file   Highest education level: Some college, no degree  Occupational History   Occupation: Day-care  Tobacco Use   Smoking status: Never   Smokeless tobacco: Never  Vaping Use   Vaping Use: Never used  Substance and Sexual Activity   Alcohol use: No   Drug use: No   Sexual activity: Not Currently    Partners: Male    Birth control/protection: Post-menopausal  Other Topics Concern   Not on file  Social History Narrative   Not on file   Social Determinants of Health   Financial Resource Strain:  Not on file  Food Insecurity: Not on file  Transportation Needs: Not on file  Physical Activity: Not on file  Stress: Not on file  Social Connections: Not on file  Intimate Partner Violence: Not on file    Family History  Problem Relation Age of Onset   Stroke Mother    Hypertension Mother    Arthritis Mother    COPD Father 52       decsd due to lack of oxygen   Hypertension Father    Alcohol abuse Father    Gout Father    Emphysema Father    Diabetes Brother    Alcohol abuse Brother    Throat cancer Brother    Stroke Maternal Grandmother    Depression Paternal Grandmother     Past Surgical History:  Procedure Laterality Date   BREAST BIOPSY Right 05/28/2016   DENSE STROMAL FIBROSIS    COLONOSCOPY WITH PROPOFOL N/A 04/24/2016    Procedure: COLONOSCOPY WITH PROPOFOL;  Surgeon: Charolett Bumpers, MD;  Location: [facility];  Service: Endoscopy;  Laterality: N/A;   EYE SURGERY Bilateral    lens replacements   FRACTURE SURGERY Right 2003   fibula   surgery for fibroids  2000    ROS: Review of Systems Negative except as stated above  PHYSICAL EXAM: BP 103/65 (BP Location: Left Arm, Patient Position: Sitting, Cuff Size: Normal)   Pulse 83   Temp 98 F (36.7 C) (Oral)   Ht 5\' 3"  (1.6 m)   Wt 147 lb (66.7 kg)   SpO2 98%   BMI 26.04 kg/m   Physical Exam  General appearance - alert, well appearing, and in no distress Mental status - normal mood, behavior, speech, dress, motor activity, and thought processes Musculoskeletal - RT knee:  mild to moderate jt enlargement.  No point tenderness along the medial or lateral joint lines.  Mild crepitus and discomfort with passive range of motion.  When she ambulates, she favors the right leg.  Gait is slow with low foot to floor clearance. Skin -patient has patches of spider veins over both lower extremities.  Slight redness over some of the spider veins on the left leg.  These areas are not tender to touch and are not raised.      Latest Ref Rng & Units 06/27/2022    8:45 AM 05/22/2022    7:00 PM 03/28/2022    3:46 PM  CMP  Glucose 70 - 99 mg/dL 99  161  88   BUN 8 - 27 mg/dL 15  24  17    Creatinine 0.57 - 1.00 mg/dL 0.96  0.45  4.09   Sodium 134 - 144 mmol/L 141  137  139   Potassium 3.5 - 5.2 mmol/L 4.1  4.1  4.0   Chloride 96 - 106 mmol/L 102  102  100   CO2 20 - 29 mmol/L 25  25  24    Calcium 8.7 - 10.3 mg/dL 9.2  9.2  9.3   Total Protein 6.0 - 8.5 g/dL   7.3   Total Bilirubin 0.0 - 1.2 mg/dL   0.3   Alkaline Phos 44 - 121 IU/L   69   AST 0 - 40 IU/L   19   ALT 0 - 32 IU/L   15    Lipid Panel     Component Value Date/Time   CHOL 170 11/28/2021 1450   TRIG 66 11/28/2021 1450   HDL 87 11/28/2021 1450   CHOLHDL 2.0 11/28/2021 1450  CHOLHDL 2.1 02/23/2016  0918   VLDL 8 02/23/2016 0918   LDLCALC 70 11/28/2021 1450    CBC    Component Value Date/Time   WBC 6.1 05/22/2022 1900   RBC 4.26 05/22/2022 1900   HGB 11.9 (L) 05/22/2022 1900   HGB 12.3 03/28/2022 1546   HCT 36.6 05/22/2022 1900   HCT 38.9 03/28/2022 1546   PLT 231 05/22/2022 1900   PLT 250 03/28/2022 1546   MCV 85.9 05/22/2022 1900   MCV 86 03/28/2022 1546   MCH 27.9 05/22/2022 1900   MCHC 32.5 05/22/2022 1900   RDW 14.4 05/22/2022 1900   RDW 13.0 03/28/2022 1546   LYMPHSABS 2.4 03/28/2022 1546   MONOABS 484 08/18/2015 1050   EOSABS 0.1 03/28/2022 1546   BASOSABS 0.0 03/28/2022 1546    ASSESSMENT AND PLAN:  1. Primary osteoarthritis of right knee Patient has significant osteoarthritis of the right knee that affects her ability to ambulate and stand.  She would benefit from having a rollator walker with seat.  Will send prescription to [facility]. - For home use only DME Other see comment  2. Dermatitis Patient has an appointment later this month with a dermatologist for evaluation of her thinning hair.  Advised that she has a dermatologist take a look at the legs as well.    Patient was given the opportunity to ask questions.  Patient verbalized understanding of the plan and was able to repeat key elements of the plan.   This documentation was completed using Paediatric nurse.  Any transcriptional errors are unintentional.  No orders of the defined types were placed in this encounter.    Requested Prescriptions    No prescriptions requested or ordered in this encounter    No follow-ups on file.  Jonah Blue, MD, FACP

## 2022-10-01 ENCOUNTER — Other Ambulatory Visit: Payer: Self-pay | Admitting: Internal Medicine

## 2022-10-01 DIAGNOSIS — Z1231 Encounter for screening mammogram for malignant neoplasm of breast: Secondary | ICD-10-CM

## 2022-10-05 ENCOUNTER — Telehealth: Payer: Self-pay

## 2022-10-05 NOTE — Telephone Encounter (Signed)
Copied from CRM [id number]. Topic: General - Inquiry >> Oct 05, 2022  9:01 AM Marlow Baars wrote: Reason for CRM: The patient called in stating she wanted to cancel her previous request for a rollator walker. Someone was gracious enough to give her one already.

## 2022-10-10 DIAGNOSIS — L658 Other specified nonscarring hair loss: Secondary | ICD-10-CM | POA: Diagnosis not present

## 2022-10-10 DIAGNOSIS — R202 Paresthesia of skin: Secondary | ICD-10-CM | POA: Diagnosis not present

## 2022-10-10 DIAGNOSIS — I872 Venous insufficiency (chronic) (peripheral): Secondary | ICD-10-CM | POA: Diagnosis not present

## 2022-10-11 IMAGING — MG MM DIGITAL SCREENING BILAT W/ TOMO AND CAD
8 series · 8 of 24 positions shown · non-contrast
Comparison: Previous exam(s).

CLINICAL DATA: Screening.

EXAM:
DIGITAL SCREENING BILATERAL MAMMOGRAM WITH TOMOSYNTHESIS AND CAD
TECHNIQUE: Bilateral screening digital craniocaudal and mediolateral oblique
mammograms were obtained. Bilateral screening digital breast
tomosynthesis was performed. The images were evaluated with
computer-aided detection.

[L MLO synth-2D]
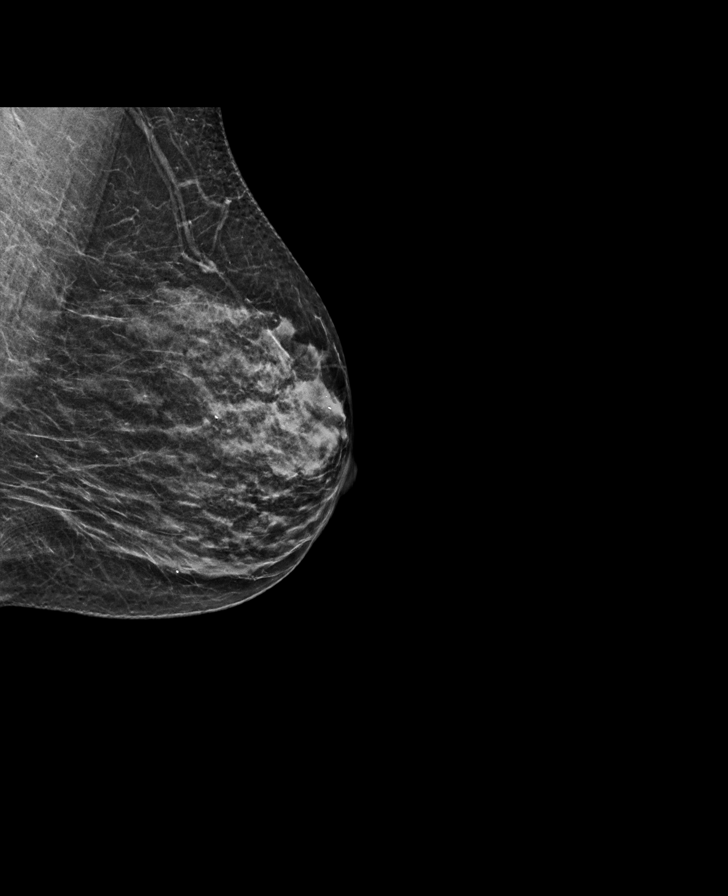

[R MLO synth-2D]
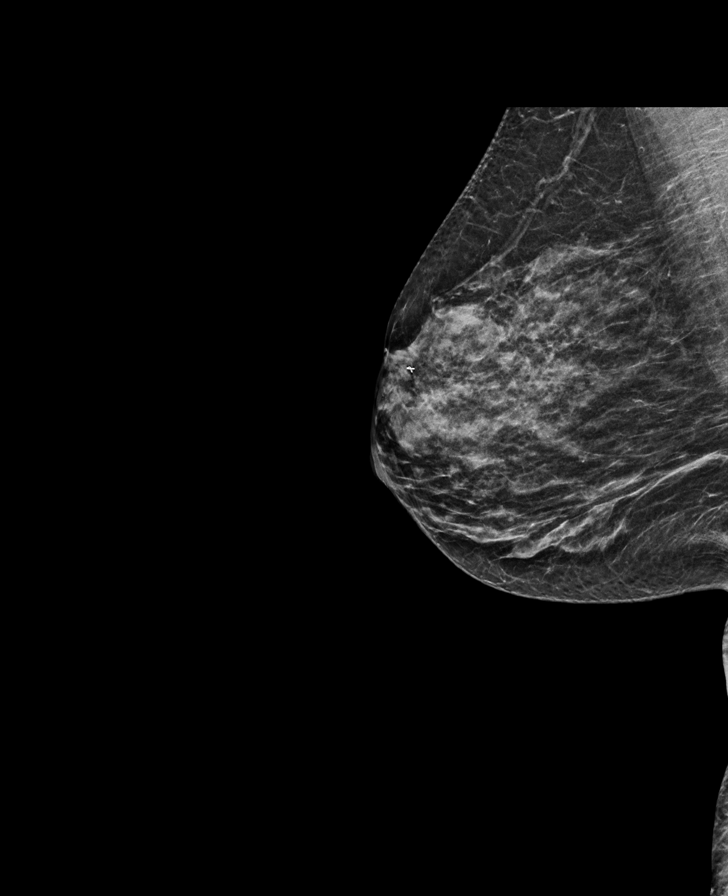

[L CC synth-2D]
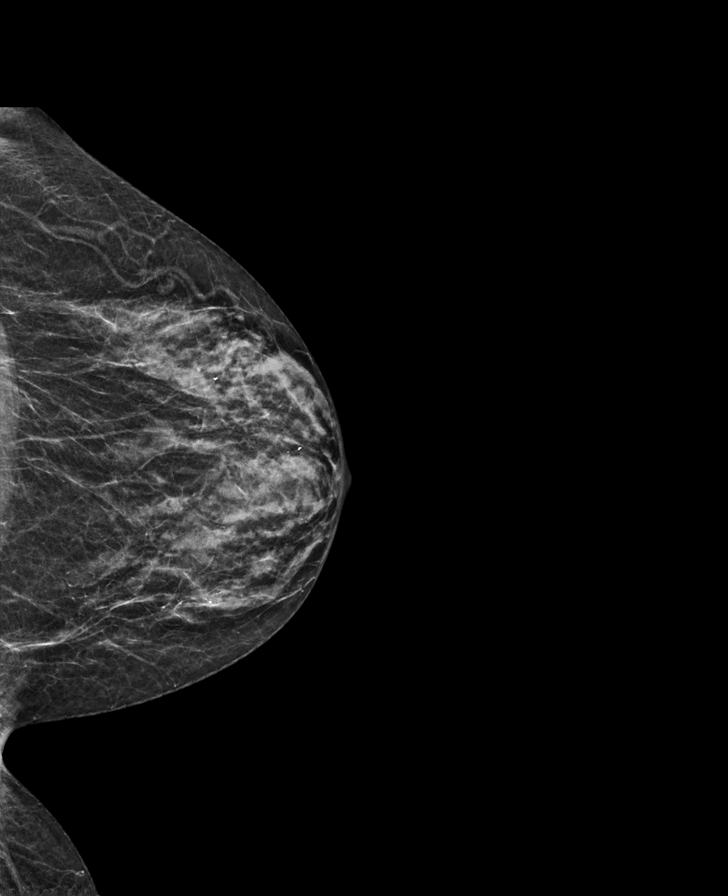

[R CC synth-2D]
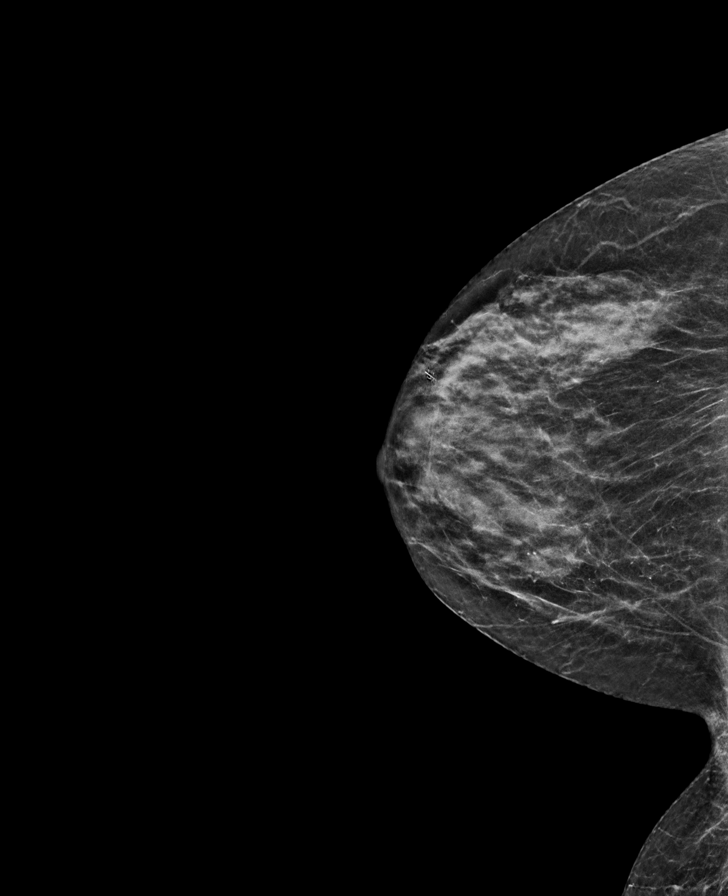

[R MLO tomo · tomo slice 21/40.0]
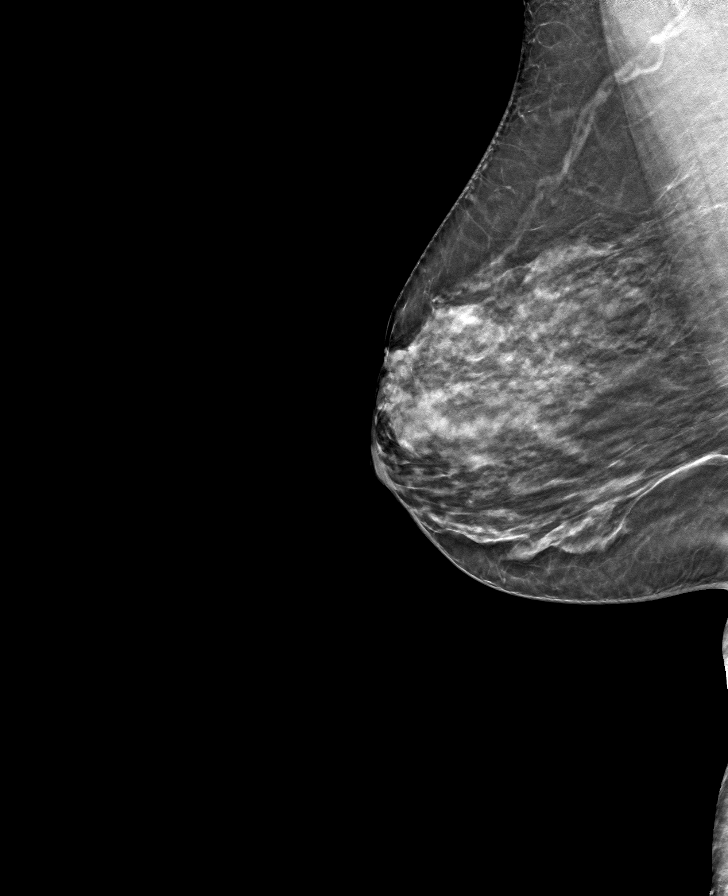

[L CC tomo · tomo slice 19/38.0]
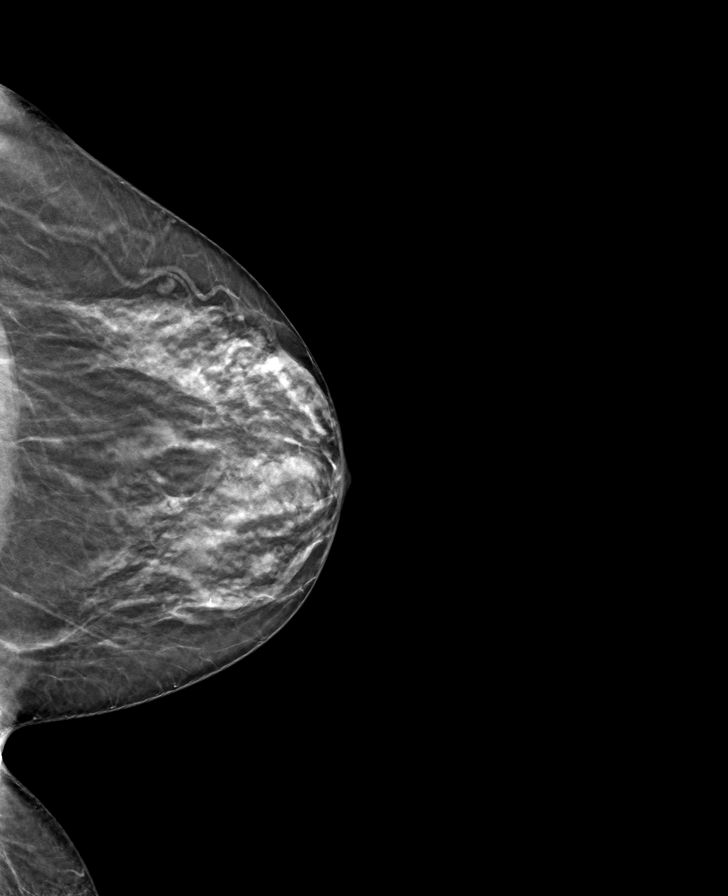

[L MLO tomo · tomo slice 20/39.0]
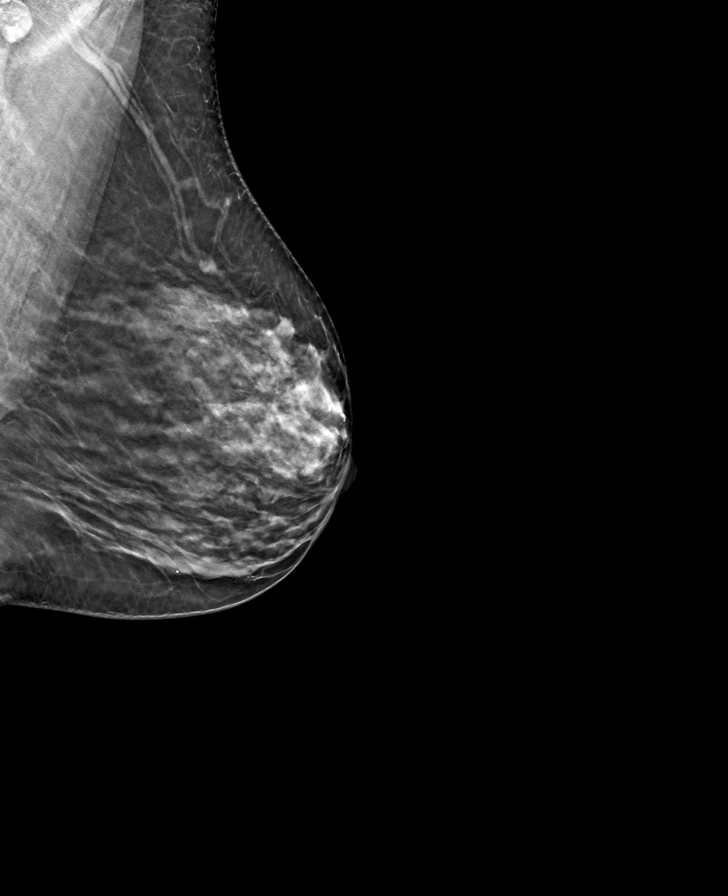

[R CC tomo · tomo slice 22/43.0]
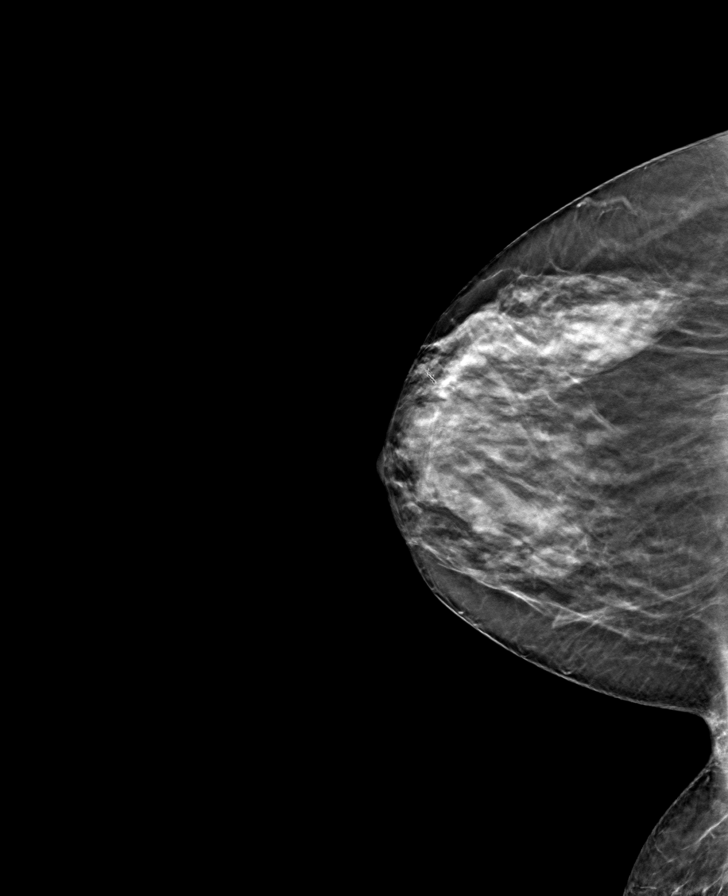

[8 of 24 positions shown; findings below may reference images not displayed]

ACR Breast Density Category c: The breast tissue is heterogeneously
dense, which may obscure small masses.
FINDINGS: There are no findings suspicious for malignancy.
IMPRESSION: No mammographic evidence of malignancy. A result letter of this
screening mammogram will be mailed directly to the patient.

RECOMMENDATION:
Screening mammogram in one year. (Code:Q3-W-BC3)

BI-RADS CATEGORY  1: Negative.

## 2022-10-15 NOTE — Telephone Encounter (Signed)
Called [facility] and spoke to [place].Victorino Dike informed me that the patient has denied the rollator walker therefore the order has been cancelled. No further action needed at this time.

## 2022-10-19 ENCOUNTER — Encounter (HOSPITAL_COMMUNITY): Payer: Self-pay

## 2022-10-19 ENCOUNTER — Ambulatory Visit (HOSPITAL_COMMUNITY)
Admission: EM | Admit: 2022-10-19 | Discharge: 2022-10-19 | Disposition: A | Discharge status: Home / Self Care | Payer: 59 | Attending: Emergency Medicine | Admitting: Emergency Medicine

## 2022-10-19 DIAGNOSIS — L249 Irritant contact dermatitis, unspecified cause: Secondary | ICD-10-CM | POA: Diagnosis not present

## 2022-10-19 MED ORDER — TRIAMCINOLONE ACETONIDE 0.1 % EX CREA: 1.0000 | TOPICAL_CREAM | Freq: Two times a day (BID) | CUTANEOUS | 0 refills | Status: DC

## 2022-10-19 MED ORDER — METHYLPREDNISOLONE SODIUM SUCC 125 MG IJ SOLR
INTRAMUSCULAR | Status: AC
  Filled 2022-10-19: qty 2

## 2022-10-19 MED ORDER — METHYLPREDNISOLONE SODIUM SUCC 125 MG IJ SOLR
80.0000 mg | Freq: Once | INTRAMUSCULAR | Status: AC
  Administered 2022-10-19: 80 mg via INTRAMUSCULAR

## 2022-10-19 MED ORDER — DIPHENHYDRAMINE HCL 25 MG PO TABS: 25.0000 mg | ORAL_TABLET | Freq: Four times a day (QID) | ORAL | 0 refills | Status: DC | PRN

## 2022-10-19 NOTE — Discharge Instructions (Signed)
Your symptoms are consistent with dermatitis. Continue with the calamine lotion, you can also apply the Kenalog ointment twice daily for itching. Please keep your blister clean and covered, do not attempt to pop it, as it will pop when it is ready. You can take Benadryl every 6 hours for itching, be cautious as this will cause sedation.   Return to clinic for any new or concerning symptoms like fever, streaking, purulent drainage or no improvement over the next week.

## 2022-10-19 NOTE — ED Triage Notes (Signed)
Pt reports rash all over her body for several days. Pt is using OTC cream; with no relief.

## 2022-10-19 NOTE — ED Provider Notes (Signed)
[facility]    CSN: [id number] Arrival date & time: 10/19/22  1019      History   Chief Complaint No chief complaint on file.   HPI Emily [name] is a 72 y.o. female.   Patient reports a rash after working in the garden the other day. She was wearing gloves, noticed the rash to her right forearm initall;y, has now spread to her left and her ankles. Has been using calamine lotion topically. Has a large blister to her right forearm.     The history is provided by the patient and medical records.    Past Medical History:  Diagnosis Date   Anemia    iron runs low   Anxiety    Anxiety state, unspecified 11/21/2012   Breast fibrocystic disorder 11/21/2012   Bronchitis    Cataract    Fibroid    History of chicken pox    History of uterine fibroid    Incidental lung nodule, > 3mm and < 8mm 02/15/2012   5mm RUL subpleural nodule found on CXR screening for TB due to h/o TB exposure as a child with positive ppd.  Recheck CT scan in 6-12 mos (March - Sept 2014) Repeat CT scan 01/2013 shows: Stable subpleural nodule in the right upper lobe measuring 5 mm. If this patient is at low risk for lung cancer then no further workup is necessary.  If this patient is at increased risk for lung cancer then foll   Nontraumatic tear of skin    right side of vagina skin since 06-12-15, small amount of blood when wipes   Precordial chest pain 11/21/2012   Normal stress echocardiogram 11/2010  CCTA 07/24/2022: CAC score 0, no CAD   Prediabetes 11/21/2012   STD (sexually transmitted disease)    chlamydia treated, hsv vaginal   Tuberculosis    exposed as a young child    Patient Active Problem List   Diagnosis Date Noted   Primary osteoarthritis of right knee 07/15/2021   Influenza vaccine refused 06/13/2020   Dermatitis 06/13/2020   Mixed hyperlipidemia 06/13/2020   Prediabetes 11/21/2012   Breast fibrocystic disorder 11/21/2012   Precordial chest pain 11/21/2012   Anxiety state,  unspecified 11/21/2012   Incidental lung nodule, > 3mm and < 8mm 02/15/2012    Past Surgical History:  Procedure Laterality Date   BREAST BIOPSY Right 05/28/2016   DENSE STROMAL FIBROSIS    COLONOSCOPY WITH PROPOFOL N/A 04/24/2016   Procedure: COLONOSCOPY WITH PROPOFOL;  Surgeon: Charolett Bumpers, MD;  Location: [facility];  Service: Endoscopy;  Laterality: N/A;   EYE SURGERY Bilateral    lens replacements   FRACTURE SURGERY Right 2003   fibula   surgery for fibroids  2000    OB History     Gravida  2   Para  2   Term  2   Preterm      AB      Living  2      SAB      IAB      Ectopic      Multiple      Live Births  2            Home Medications    Prior to Admission medications   Medication Sig Start Date End Date Taking? Authorizing Provider  atorvastatin (LIPITOR) 10 MG tablet TAKE 1 TABLET BY MOUTH EVERY DAY 09/21/22  Yes Marcine Matar, MD  diphenhydrAMINE (BENADRYL) 25 MG tablet Take  1 tablet (25 mg total) by mouth every 6 (six) hours as needed. 10/19/22  Yes Rinaldo Ratel, [place] N, FNP  metoprolol tartrate (LOPRESSOR) 50 MG tablet TAKE 1 TABLET BY MOUTH 2 HOURS PRIOR TO THE CARDIAC CT 07/12/22  Yes Tereso Newcomer T, PA-C  triamcinolone cream (KENALOG) 0.1 % Apply 1 Application topically 2 (two) times daily. 10/19/22  Yes Rinaldo Ratel, [place] N, FNP  cyanocobalamin (CYANOCOBALAMIN) 500 MCG tablet Take 500 mcg by mouth daily.    [provider]  ferrous sulfate 325 (65 FE) MG tablet Take 325 mg by mouth daily with breakfast.    [provider]  glucosamine-chondroitin 500-400 MG tablet Take 1 tablet by mouth daily.    [provider]  ivabradine (CORLANOR) 7.5 MG TABS tablet Take tablets (15mg ) TWO hours prior to your cardiac CT scan. 07/12/22   Tereso Newcomer T, PA-C  multivitamin-lutein (OCUVITE-LUTEIN) CAPS capsule Take 1 capsule by mouth daily.    [provider]    Family History Family History  Problem Relation Age  of Onset   Stroke Mother    Hypertension Mother    Arthritis Mother    COPD Father 73       decsd due to lack of oxygen   Hypertension Father    Alcohol abuse Father    Gout Father    Emphysema Father    Diabetes Brother    Alcohol abuse Brother    Throat cancer Brother    Stroke Maternal Grandmother    Depression Paternal Grandmother     Social History Social History   Tobacco Use   Smoking status: Never   Smokeless tobacco: Never  Vaping Use   Vaping Use: Never used  Substance Use Topics   Alcohol use: No   Drug use: No     Allergies   Latex   Review of Systems Review of Systems  Skin:  Positive for rash.     Physical Exam Triage Vital Signs ED Triage Vitals [10/19/22 1111]  Enc Vitals Group     BP 125/68     Pulse Rate 72     Resp 18     Temp 98.3 F (36.8 C)     Temp Source Oral     SpO2 98 %     Weight      Height      Head Circumference      Peak Flow      Pain Score      Pain Loc      Pain Edu?      Excl. in GC?    No data found.  Updated Vital Signs BP 125/68 (BP Location: Left Arm)   Pulse 72   Temp 98.3 F (36.8 C) (Oral)   Resp 18   SpO2 98%   Visual Acuity Right Eye Distance:   Left Eye Distance:   Bilateral Distance:    Right Eye Near:   Left Eye Near:    Bilateral Near:     Physical Exam Vitals and nursing note reviewed.  Constitutional:      Appearance: Normal appearance.  HENT:     Head: Normocephalic and atraumatic.     Right Ear: External ear normal.     Left Ear: External ear normal.     Nose: Nose normal.     Mouth/Throat:     Mouth: Mucous membranes are moist.  Eyes:     Conjunctiva/sclera: Conjunctivae normal.  Cardiovascular:     Rate and Rhythm: Normal rate.  Pulmonary:     Effort: Pulmonary effort is normal. No respiratory distress.  Skin:    General: Skin is warm and dry.     Capillary Refill: Capillary refill takes less than 2 seconds.     Findings: Rash present. Rash is urticarial and  vesicular.     Comments: Blistering / vesicular rash to bilateral forearms, right FA with a large blister.  Scant vesicular urticarial rash to bilateral ankles.  Neurological:     General: No focal deficit present.     Mental Status: She is alert and oriented to person, place, and time.  Psychiatric:        Mood and Affect: Mood normal.        Behavior: Behavior normal. Behavior is cooperative.      UC Treatments / Results  Labs (all labs ordered are listed, but only abnormal results are displayed) Labs Reviewed - No data to display  EKG   Radiology No results found.  Procedures Procedures (including critical care time)  Medications Ordered in UC Medications  methylPREDNISolone sodium succinate (SOLU-MEDROL) 125 mg/2 mL injection 80 mg (has no administration in time range)    Initial Impression / Assessment and Plan / UC Course  I have reviewed the triage vital signs and the nursing notes.  Pertinent labs & imaging results that were available during my care of the patient were reviewed by me and considered in my medical decision making (see chart for details).  Vitals in triage reviewed, patient is hemodynamically stable.  Suspect irritant contact dermatitis due to poison oak or poison ivy.  Will give IM steroid injection in clinic and cover with triamcinolone cream.  Benadryl sparingly for itching.  Wound care discussed for large blister, plan of care, follow-up care and return precautions given, no questions at this time.     Final Clinical Impressions(s) / UC Diagnoses   Final diagnoses:  Irritant contact dermatitis, unspecified trigger     Discharge Instructions      Your symptoms are consistent with dermatitis. Continue with the calamine lotion, you can also apply the Kenalog ointment twice daily for itching. Please keep your blister clean and covered, do not attempt to pop it, as it will pop when it is ready. You can take Benadryl every 6 hours for itching, be  cautious as this will cause sedation.   Return to clinic for any new or concerning symptoms like fever, streaking, purulent drainage or no improvement over the next week.      ED Prescriptions     Medication Sig Dispense Auth. Provider   triamcinolone cream (KENALOG) 0.1 % Apply 1 Application topically 2 (two) times daily. 30 g Masaichi Kracht, [place] N, [place]   diphenhydrAMINE (BENADRYL) 25 MG tablet Take 1 tablet (25 mg total) by mouth every 6 (six) hours as needed. 30 tablet Abryana Lykens, [place] N, [place]      PDMP not reviewed this encounter.   Sallee Hogrefe, [place] N, [place] 10/19/22 1146

## 2022-10-24 ENCOUNTER — Ambulatory Visit: Payer: 59 | Admitting: Internal Medicine

## 2022-10-24 ENCOUNTER — Ambulatory Visit: Payer: 59 | Admitting: Physician Assistant

## 2022-10-24 ENCOUNTER — Encounter: Payer: Self-pay | Admitting: Physician Assistant

## 2022-10-24 VITALS — BP 100/63 | HR 97 | Resp 16 | Ht 67.0 in | Wt 147.0 lb

## 2022-10-24 DIAGNOSIS — R519 Headache, unspecified: Secondary | ICD-10-CM | POA: Diagnosis not present

## 2022-10-24 DIAGNOSIS — R070 Pain in throat: Secondary | ICD-10-CM

## 2022-10-24 DIAGNOSIS — R7303 Prediabetes: Secondary | ICD-10-CM | POA: Diagnosis not present

## 2022-10-24 DIAGNOSIS — F439 Reaction to severe stress, unspecified: Secondary | ICD-10-CM | POA: Diagnosis not present

## 2022-10-24 LAB — POCT RAPID STREP A (OFFICE): Rapid Strep A Screen: NEGATIVE

## 2022-10-24 LAB — POCT GLYCOSYLATED HEMOGLOBIN (HGB A1C): HbA1c, POC (prediabetic range): 6 % (ref 5.7–6.4)

## 2022-10-24 MED ORDER — CETIRIZINE HCL 10 MG PO TABS: 10.0000 mg | ORAL_TABLET | Freq: Every day | ORAL | 11 refills | Status: DC

## 2022-10-24 NOTE — Progress Notes (Signed)
Established Patient Office Visit  Subjective   Patient ID: Emily [name], female    DOB: [birth year]  Age: 72 y.o. MRN: [id number]  Chief Complaint  Patient presents with   Sore Throat    Presents with several complaints.  States that she has been experiencing a sore throat for the past 3 to 4 days.  States that she is eating and drinking okay, denies cough, ear pain, sinus pain.  Denies any other upper respiratory symptoms  States that she has been experiencing "bad headaches" over the past couple of months.  States that she believes they are due to stress.  States that when they occur she does not have any nausea or photophobia, states that it lasts a couple of minutes.  States that she has to her hands and put slight pressure on her head for relief.  States that she will use Tylenol with some relief.  States that she drinks very little water on a daily basis.  States sleep is good but is only sleeping 5 to 6 hours due to being very busy.       Past Medical History:  Diagnosis Date   Anemia    iron runs low   Anxiety    Anxiety state, unspecified 11/21/2012   Breast fibrocystic disorder 11/21/2012   Bronchitis    Cataract    Fibroid    History of chicken pox    History of uterine fibroid    Incidental lung nodule, > 3mm and < 8mm 02/15/2012   5mm RUL subpleural nodule found on CXR screening for TB due to h/o TB exposure as a child with positive ppd.  Recheck CT scan in 6-12 mos (March - Sept 2014) Repeat CT scan 01/2013 shows: Stable subpleural nodule in the right upper lobe measuring 5 mm. If this patient is at low risk for lung cancer then no further workup is necessary.  If this patient is at increased risk for lung cancer then foll   Nontraumatic tear of skin    right side of vagina skin since 06-12-15, small amount of blood when wipes   Precordial chest pain 11/21/2012   Normal stress echocardiogram 11/2010  CCTA 07/24/2022: CAC score 0, no CAD   Prediabetes 11/21/2012    STD (sexually transmitted disease)    chlamydia treated, hsv vaginal   Tuberculosis    exposed as a young child   Social History   Socioeconomic History   Marital status: Divorced    Spouse name: Not on file   Number of children: 2   Years of education: Not on file   Highest education level: Some college, no degree  Occupational History   Occupation: Day-care  Tobacco Use   Smoking status: Never   Smokeless tobacco: Never  Vaping Use   Vaping Use: Never used  Substance and Sexual Activity   Alcohol use: No   Drug use: No   Sexual activity: Not Currently    Partners: Male    Birth control/protection: Post-menopausal  Other Topics Concern   Not on file  Social History Narrative   Not on file   Social Determinants of Health   Financial Resource Strain: Not on file  Food Insecurity: Not on file  Transportation Needs: Not on file  Physical Activity: Not on file  Stress: Not on file  Social Connections: Not on file  Intimate Partner Violence: Not on file   Family History  Problem Relation Age of Onset   Stroke Mother  Hypertension Mother    Arthritis Mother    COPD Father 15       decsd due to lack of oxygen   Hypertension Father    Alcohol abuse Father    Gout Father    Emphysema Father    Diabetes Brother    Alcohol abuse Brother    Throat cancer Brother    Stroke Maternal Grandmother    Depression Paternal Grandmother    Allergies  Allergen Reactions   Latex Itching and Rash    Review of Systems  Constitutional:  Negative for chills and fever.  HENT:  Positive for sore throat. Negative for congestion, ear pain and sinus pain.   Eyes:  Negative for photophobia.  Respiratory:  Negative for cough, sputum production and shortness of breath.   Cardiovascular:  Negative for chest pain.  Gastrointestinal:  Negative for abdominal pain, nausea and vomiting.  Genitourinary: Negative.   Musculoskeletal:  Negative for myalgias.  Skin: Negative.    Neurological:  Positive for headaches. Negative for dizziness and weakness.  Endo/Heme/Allergies: Negative.   Psychiatric/Behavioral: Negative.        Objective:     BP 100/63 (BP Location: Right Arm, Patient Position: Sitting, Cuff Size: Normal)   Pulse 97   Resp 16   Ht 5\' 7"  (1.702 m)   Wt 147 lb (66.7 kg)   SpO2 97%   BMI 23.02 kg/m  BP Readings from Last 3 Encounters:  10/24/22 100/63  10/19/22 125/68  09/27/22 103/65   Wt Readings from Last 3 Encounters:  10/24/22 147 lb (66.7 kg)  09/27/22 147 lb (66.7 kg)  06/27/22 146 lb 12.8 oz (66.6 kg)      Physical Exam Vitals and nursing note reviewed.  Constitutional:      Appearance: Normal appearance.  HENT:     Head: Normocephalic and atraumatic.     Salivary Glands: Right salivary gland is not diffusely enlarged or tender. Left salivary gland is not diffusely enlarged or tender.     Right Ear: Tympanic membrane, ear canal and external ear normal.     Left Ear: Tympanic membrane, ear canal and external ear normal.     Nose: Nose normal.     Right Turbinates: Not enlarged or swollen.     Left Turbinates: Not enlarged or swollen.     Right Sinus: No maxillary sinus tenderness or frontal sinus tenderness.     Left Sinus: No frontal sinus tenderness.     Mouth/Throat:     Lips: Pink.     Mouth: Mucous membranes are moist.     Pharynx: Oropharynx is clear.     Tonsils: No tonsillar exudate. 0 on the right. 0 on the left.  Eyes:     Extraocular Movements: Extraocular movements intact.     Conjunctiva/sclera: Conjunctivae normal.     Pupils: Pupils are equal, round, and reactive to light.  Cardiovascular:     Rate and Rhythm: Normal rate and regular rhythm.     Pulses: Normal pulses.     Heart sounds: Normal heart sounds.  Pulmonary:     Effort: Pulmonary effort is normal.     Breath sounds: Normal breath sounds.  Musculoskeletal:        General: Normal range of motion.     Cervical back: Normal range of  motion and neck supple.  Skin:    General: Skin is warm and dry.  Neurological:     General: No focal deficit present.     Mental  Status: She is alert and oriented to person, place, and time.     Cranial Nerves: No cranial nerve deficit.     Sensory: No sensory deficit.  Psychiatric:        Mood and Affect: Mood normal.        Behavior: Behavior normal.        Thought Content: Thought content normal.        Judgment: Judgment normal.         Assessment & Plan:   Problem List Items Addressed This Visit       Other   Prediabetes   Relevant Orders   HgB A1c (Completed)   Other Visit Diagnoses     Throat discomfort    -  Primary   Relevant Medications   cetirizine (ZYRTEC ALLERGY) 10 MG tablet   Other Relevant Orders   POCT rapid strep A (Completed)   Generalized headaches       Relevant Orders   Basic Metabolic Panel (Completed)   CBC with Differential/Platelet (Completed)   Stress          1. Throat discomfort Strep negative.  More than likely due to combination of dehydration and allergies.  Trial Zyrtec.  Patient education given on supportive care.  Red flags given for prompt reevaluation. - POCT rapid strep A - cetirizine (ZYRTEC ALLERGY) 10 MG tablet; Take 1 tablet (10 mg total) by mouth daily.  Dispense: 30 tablet; Refill: 11  2. Prediabetes A1c 6.0.  Patient education given on low sugar diet.  Patient encouraged to maintain follow-up with primary care provider - HgB A1c  3. Generalized headaches Patient education given on skills to help reduce stress, improve sleep hygiene, improve hydration.  Red flags given for prompt reevaluation - Basic Metabolic Panel - CBC with Differential/Platelet  4. Stress Patient declines CBT at this time   I have reviewed the patient's medical history (PMH, PSH, Social History, Family History, Medications, and allergies) , and have been updated if relevant. I spent 30 minutes reviewing chart and  face to face time with  patient.    Return if symptoms worsen or fail to improve.    Kasandra Knudsen Mayers, PA-C

## 2022-10-24 NOTE — Patient Instructions (Addendum)
Your A1C is 6.0 .  This is considered prediabetes.  It is very important that you follow a low sugar diet and have your levels rechecked in 3 to 6 months.  To help with your throat pain, I do encourage you to start taking Zyrtec on a daily basis.  To help with your headaches,  I encourage you to increase your sleep, you should be sleeping 7 to 9 hours a night.  I encourage you to increase your hydration, you should be drinking at least 64 ounces of water a day.  I encourage you to work on reducing your stress.  We will call you with today's lab results. Roney Jaffe, PA-C Physician Assistant [facility] [email or web address]   Managing Stress, Adult Feeling a certain amount of stress is normal. Stress helps our body and mind get ready to deal with the demands of life. Stress hormones can motivate you to do well at work and meet your responsibilities. But severe or long-term (chronic) stress can affect your mental and physical health. Chronic stress puts you at higher risk for: Anxiety and depression. Other health problems such as digestive problems, muscle aches, heart disease, high blood pressure, and stroke. What are the causes? Common causes of stress include: Demands from work, such as deadlines, feeling overworked, or having long hours. Pressures at home, such as money issues, disagreements with a spouse, or parenting issues. Pressures from major life changes, such as divorce, moving, loss of a loved one, or chronic illness. You may be at higher risk for stress-related problems if you: Do not get enough sleep. Are in poor health. Do not have emotional support. Have a mental health disorder such as anxiety or depression. How to recognize stress Stress can make you: Have trouble sleeping. Feel sad, anxious, irritable, or overwhelmed. Lose your appetite. Overeat or want to eat unhealthy foods. Want to use drugs or  alcohol. Stress can also cause physical symptoms, such as: Sore, tense muscles, especially in the shoulders and neck. Headaches. Trouble breathing. A faster heart rate. Stomach pain, nausea, or vomiting. Diarrhea or constipation. Trouble concentrating. Follow these instructions at home: Eating and drinking Eat a healthy diet. This includes: Eating foods that are high in fiber, such as beans, whole grains, and fresh fruits and vegetables. Limiting foods that are high in fat and processed sugars, such as fried or sweet foods. Do not skip meals or overeat. Drink enough fluid to keep your urine pale yellow. Alcohol use Do not drink alcohol if: Your health care provider tells you not to drink. You are pregnant, may be pregnant, or are planning to become pregnant. Drinking alcohol is a way some people try to ease their stress. This can be dangerous, so if you drink alcohol: Limit how much you have to: 0-1 drink a day for women. 0-2 drinks a day for men. Know how much alcohol is in your drink. In the [place] one drink equals one 12 oz bottle of beer (355 mL), one 5 oz glass of wine (148 mL), or one 1 oz glass of hard liquor (44 mL). Activity  Include 30 minutes of exercise in your daily schedule. Exercise is a good stress reducer. Include time in your day for an activity that you find relaxing. Try taking a walk, going on a bike ride, reading a book, or listening to music. Schedule your time in a way that lowers stress, and keep a regular schedule. Focus on doing what is most important to get done.  Lifestyle Identify the source of your stress and your reaction to it. See a therapist who can help you change unhelpful reactions. When there are stressful events: Talk about them with family, friends, or coworkers. Try to think realistically about stressful events and not ignore them or overreact. Try to find the positives in a stressful situation and not focus on the negatives. Cut back on  responsibilities at work and home, if possible. Ask for help from friends or family members if you need it. Find ways to manage stress, such as: Mindfulness, meditation, or deep breathing. Yoga or tai chi. Progressive muscle relaxation. Spending time in nature. Doing art, playing music, or reading. Making time for fun activities. Spending time with family and friends. Get support from family, friends, or spiritual resources. General instructions Get enough sleep. Try to go to sleep and get up at about the same time every day. Take over-the-counter and prescription medicines only as told by your health care provider. Do not use any products that contain nicotine or tobacco. These products include cigarettes, chewing tobacco, and vaping devices, such as e-cigarettes. If you need help quitting, ask your health care provider. Do not use drugs or smoke to deal with stress. Keep all follow-up visits. This is important. Where to find support Talk with your health care provider about stress management or finding a support group. Find a therapist to work with you on your stress management techniques. Where to find more information [employer] on Mental Illness: www.nami.org American Psychological Association: [employer] Contact a health care provider if: Your stress symptoms get worse. You are unable to manage your stress at home. You are struggling to stop using drugs or alcohol. Get help right away if: You may be a danger to yourself or others. You have any thoughts of death or suicide. Get help right awayif you feel like you may hurt yourself or others, or have thoughts about taking your own life. Go to your nearest emergency room or: Call 911. Call the National Suicide Prevention Lifeline at 331-639-9215 or 988 in the [place] This is open 24 hours a day. Text the Crisis Text Line at 3040654439. Summary Feeling a certain amount of stress is normal, but severe or long-term (chronic)  stress can affect your mental and physical health. Chronic stress can put you at higher risk for anxiety, depression, and other health problems such as digestive problems, muscle aches, heart disease, high blood pressure, and stroke. You may be at higher risk for stress-related problems if you do not get enough sleep, are in poor health, lack emotional support, or have a mental health disorder such as anxiety or depression. Identify the source of your stress and your reaction to it. Try talking about stressful events with family, friends, or coworkers, finding a coping method, or getting support from spiritual resources. If you need more help, talk with your health care provider about finding a support group or a mental health therapist. This information is not intended to replace advice given to you by your health care provider. Make sure you discuss any questions you have with your health care provider. Document Revised: 11/24/2020 Document Reviewed: 11/22/2020 Elsevier Patient Education  2024 [employer].

## 2022-10-24 NOTE — Progress Notes (Signed)
Sore throat x 2 days Headache x 1 month,feels like it is from stress.  Feels better since getting shot for poison ivy. Was treated on Monday for poison ivy at [facility].

## 2022-10-25 ENCOUNTER — Telehealth: Payer: Self-pay | Admitting: Internal Medicine

## 2022-10-25 LAB — CBC WITH DIFFERENTIAL/PLATELET
Basophils Absolute: 0 10*3/uL (ref 0.0–0.2)
Basos: 1 %
EOS (ABSOLUTE): 0.2 10*3/uL (ref 0.0–0.4)
Eos: 3 %
Hematocrit: 37.4 % (ref 34.0–46.6)
Hemoglobin: 11.8 g/dL (ref 11.1–15.9)
Immature Grans (Abs): 0 10*3/uL (ref 0.0–0.1)
Immature Granulocytes: 0 %
Lymphocytes Absolute: 2.1 10*3/uL (ref 0.7–3.1)
Lymphs: 32 %
MCH: 27.4 pg (ref 26.6–33.0)
MCHC: 31.6 g/dL (ref 31.5–35.7)
MCV: 87 fL (ref 79–97)
Monocytes Absolute: 0.6 10*3/uL (ref 0.1–0.9)
Monocytes: 10 %
Neutrophils Absolute: 3.7 10*3/uL (ref 1.4–7.0)
Neutrophils: 54 %
Platelets: 266 10*3/uL (ref 150–450)
RBC: 4.31 x10E6/uL (ref 3.77–5.28)
RDW: 12.9 % (ref 11.7–15.4)
WBC: 6.7 10*3/uL (ref 3.4–10.8)

## 2022-10-25 LAB — BASIC METABOLIC PANEL
BUN/Creatinine Ratio: 35 — ABNORMAL HIGH (ref 12–28)
BUN: 27 mg/dL (ref 8–27)
CO2: 24 mmol/L (ref 20–29)
Calcium: 9.3 mg/dL (ref 8.7–10.3)
Chloride: 103 mmol/L (ref 96–106)
Creatinine, Ser: 0.78 mg/dL (ref 0.57–1.00)
Glucose: 174 mg/dL — ABNORMAL HIGH (ref 70–99)
Potassium: 3.9 mmol/L (ref 3.5–5.2)
Sodium: 142 mmol/L (ref 134–144)
eGFR: 80 mL/min/{1.73_m2} (ref >59)

## 2022-10-25 NOTE — Telephone Encounter (Signed)
[facility] called and [facility] Bess[facility] about the refill(s) cetirizine requested. Advised that patient said it was filled. She says her insurance will not cover it and unsure what the formulary is indicating which allergy medication is covered. Patient called, left VM to return the call to the office. She will need to be advised to call her insurance company to find which allergy medications are covered, then call back to let us know.

## 2022-10-25 NOTE — Telephone Encounter (Signed)
Medication Refill - Medication: cetirizine (ZYRTEC ALLERGY) 10 MG tablet   Has the patient contacted their pharmacy? Yes.   Pt states that she went [facility] the [facility] yesterday and was prescribed this medication but when she go to the pharmacy it is not covered. Pt is wanting to see if her PCP could call this medication in for her. I did explain to the pt sometimes the insurance will not cover the medication if it is over the counter. Pt would still like to see if Dr. Laural Benes can still call this medication in for her.   Preferred Pharmacy (with phone number or street name):  [id number] - [place], [place] - [address] Phone: [phone]  Fax: [phone]     Has the patient been seen for an appointment in the last year OR does the patient have an upcoming appointment? Yes.    Agent: Please be advised that RX refills may take up to 3 business days. We ask that you follow-up with your pharmacy.

## 2022-11-06 ENCOUNTER — Ambulatory Visit
Admission: RE | Admit: 2022-11-06 | Discharge: 2022-11-06 | Disposition: A | Discharge status: Home / Self Care | Payer: 59 | Source: Ambulatory Visit | Attending: Internal Medicine | Admitting: Internal Medicine

## 2022-11-06 DIAGNOSIS — Z1231 Encounter for screening mammogram for malignant neoplasm of breast: Secondary | ICD-10-CM | POA: Diagnosis not present

## 2022-11-21 ENCOUNTER — Ambulatory Visit: Payer: 59 | Admitting: Physician Assistant

## 2022-11-26 ENCOUNTER — Ambulatory Visit: Payer: 59 | Admitting: Internal Medicine

## 2023-01-18 ENCOUNTER — Other Ambulatory Visit: Payer: Self-pay | Admitting: Internal Medicine

## 2023-01-18 DIAGNOSIS — E782 Mixed hyperlipidemia: Secondary | ICD-10-CM

## 2023-02-19 ENCOUNTER — Ambulatory Visit: Payer: 59 | Admitting: Internal Medicine

## 2023-02-19 ENCOUNTER — Ambulatory Visit: Payer: 59 | Attending: Internal Medicine

## 2023-02-19 VITALS — Ht 67.0 in | Wt 147.0 lb

## 2023-02-19 DIAGNOSIS — Z0000 Encounter for general adult medical examination without abnormal findings: Secondary | ICD-10-CM

## 2023-02-19 NOTE — Progress Notes (Signed)
Subjective:   Emily [name] is a 72 y.o. female who presents for Medicare Annual (Subsequent) preventive examination.  Visit Complete: Virtual I connected with  Emily [name] on 02/19/23 by a audio enabled telemedicine application and verified that I am speaking with the correct person using two identifiers.  Patient Location: Home  Provider Location: Home Office  I discussed the limitations of evaluation and management by telemedicine. The patient expressed understanding and agreed to proceed.  Vital Signs: Because this visit was a virtual/telehealth visit, some criteria may be missing or patient reported. Any vitals not documented were not able to be obtained and vitals that have been documented are patient reported.  Cardiac Risk Factors include: advanced age (>10men, >28 women);dyslipidemia     Objective:    Today's Vitals   02/19/23 1448  Weight: 147 lb (66.7 kg)  Height: 5\' 7"  (1.702 m)   Body mass index is 23.02 kg/m.     02/19/2023    2:55 PM 05/22/2022    6:08 PM 01/29/2022    8:48 AM 11/01/2021   11:01 AM 02/25/2021    8:57 AM 09/13/2020   10:28 AM 04/24/2016   11:42 AM  Advanced Directives  Does Patient Have a Medical Advance Directive? No No No No No No No  Would patient like information on creating a medical advance directive? Yes (MAU/Ambulatory/Procedural Areas - Information given)  No - Patient declined No - Patient declined Yes (MAU/Ambulatory/Procedural Areas - Information given) Yes (Inpatient - patient defers creating a medical advance directive at this time - Information given)     Current Medications (verified) Outpatient Encounter Medications as of 02/19/2023  Medication Sig   atorvastatin (LIPITOR) 10 MG tablet TAKE 1 TABLET BY MOUTH EVERY DAY   cetirizine (ZYRTEC ALLERGY) 10 MG tablet Take 1 tablet (10 mg total) by mouth daily.   [DISCONTINUED] cyanocobalamin (CYANOCOBALAMIN) 500 MCG tablet Take 500 mcg by mouth daily. (Patient not taking: Reported  on 10/24/2022)   [DISCONTINUED] diphenhydrAMINE (BENADRYL) 25 MG tablet Take 1 tablet (25 mg total) by mouth every 6 (six) hours as needed. (Patient not taking: Reported on 10/24/2022)   [DISCONTINUED] ferrous sulfate 325 (65 FE) MG tablet Take 325 mg by mouth daily with breakfast. (Patient not taking: Reported on 10/24/2022)   [DISCONTINUED] glucosamine-chondroitin 500-400 MG tablet Take 1 tablet by mouth daily. (Patient not taking: Reported on 10/24/2022)   [DISCONTINUED] ivabradine (CORLANOR) 7.5 MG TABS tablet Take tablets (15mg ) TWO hours prior to your cardiac CT scan. (Patient not taking: Reported on 10/24/2022)   [DISCONTINUED] metoprolol tartrate (LOPRESSOR) 50 MG tablet TAKE 1 TABLET BY MOUTH 2 HOURS PRIOR TO THE CARDIAC CT (Patient not taking: Reported on 10/24/2022)   [DISCONTINUED] multivitamin-lutein (OCUVITE-LUTEIN) CAPS capsule Take 1 capsule by mouth daily. (Patient not taking: Reported on 10/24/2022)   [DISCONTINUED] triamcinolone cream (KENALOG) 0.1 % Apply 1 Application topically 2 (two) times daily. (Patient not taking: Reported on 10/24/2022)   No facility-administered encounter medications on file as of 02/19/2023.    Allergies (verified) Latex   History: Past Medical History:  Diagnosis Date   Anemia    iron runs low   Anxiety    Anxiety state, unspecified 11/21/2012   Breast fibrocystic disorder 11/21/2012   Bronchitis    Cataract    Fibroid    History of chicken pox    History of uterine fibroid    Incidental lung nodule, > 3mm and < 8mm 02/15/2012   5mm RUL subpleural nodule found on CXR  screening for TB due to h/o TB exposure as a child with positive ppd.  Recheck CT scan in 6-12 mos (March - Sept 2014) Repeat CT scan 01/2013 shows: Stable subpleural nodule in the right upper lobe measuring 5 mm. If this patient is at low risk for lung cancer then no further workup is necessary.  If this patient is at increased risk for lung cancer then foll   Nontraumatic tear of skin     right side of vagina skin since 06-12-15, small amount of blood when wipes   Precordial chest pain 11/21/2012   Normal stress echocardiogram 11/2010  CCTA 07/24/2022: CAC score 0, no CAD   Prediabetes 11/21/2012   STD (sexually transmitted disease)    chlamydia treated, hsv vaginal   Tuberculosis    exposed as a young child   Past Surgical History:  Procedure Laterality Date   BREAST BIOPSY Right 05/28/2016   DENSE STROMAL FIBROSIS    COLONOSCOPY WITH PROPOFOL N/A 04/24/2016   Procedure: COLONOSCOPY WITH PROPOFOL;  Surgeon: Charolett Bumpers, MD;  Location: [facility];  Service: Endoscopy;  Laterality: N/A;   EYE SURGERY Bilateral    lens replacements   FRACTURE SURGERY Right 2003   fibula   surgery for fibroids  2000   Family History  Problem Relation Age of Onset   Stroke Mother    Hypertension Mother    Arthritis Mother    COPD Father 38       decsd due to lack of oxygen   Hypertension Father    Alcohol abuse Father    Gout Father    Emphysema Father    Diabetes Brother    Alcohol abuse Brother    Throat cancer Brother    Stroke Maternal Grandmother    Depression Paternal Grandmother    Social History   Socioeconomic History   Marital status: Divorced    Spouse name: Not on file   Number of children: 2   Years of education: Not on file   Highest education level: Some college, no degree  Occupational History   Occupation: Day-care  Tobacco Use   Smoking status: Never   Smokeless tobacco: Never  Vaping Use   Vaping status: Never Used  Substance and Sexual Activity   Alcohol use: No   Drug use: No   Sexual activity: Not Currently    Partners: Male    Birth control/protection: Post-menopausal  Other Topics Concern   Not on file  Social History Narrative   Not on file   Social Determinants of Health   Financial Resource Strain: Low Risk  (02/19/2023)   Overall Financial Resource Strain (CARDIA)    Difficulty of Paying Living Expenses: Not hard at all   Food Insecurity: No Food Insecurity (02/19/2023)   Hunger Vital Sign    Worried About Running Out of Food in the Last Year: Never true    Ran Out of Food in the Last Year: Never true  Transportation Needs: No Transportation Needs (02/19/2023)   PRAPARE - Administrator, Civil Service (Medical): No    Lack of Transportation (Non-Medical): No  Physical Activity: Inactive (02/19/2023)   Exercise Vital Sign    Days of Exercise per Week: 0 days    Minutes of Exercise per Session: 0 min  Stress: No Stress Concern Present (02/19/2023)   [employer] of Occupational Health - Occupational Stress Questionnaire    Feeling of Stress : Not at all  Social Connections: Moderately Isolated (02/19/2023)  Social Connection and Isolation Panel [NHANES]    Frequency of Communication with Friends and Family: More than three times a week    Frequency of Social Gatherings with Friends and Family: Three times a week    Attends Religious Services: 1 to 4 times per year    Active Member of Clubs or Organizations: No    Attends Banker Meetings: Never    Marital Status: Divorced    Tobacco Counseling Counseling given: Not Answered   Clinical Intake:  Pre-visit preparation completed: Yes  Pain : No/denies pain     Diabetes: No  How often do you need to have someone help you when you read instructions, pamphlets, or other written materials from your doctor or pharmacy?: 1 - Never  Interpreter Needed?: No  Information entered by :: Kandis Fantasia LPN   Activities of Daily Living    02/19/2023    2:55 PM  In your present state of health, do you have any difficulty performing the following activities:  Hearing? 0  Vision? 0  Difficulty concentrating or making decisions? 0  Walking or climbing stairs? 0  Dressing or bathing? 0  Doing errands, shopping? 0  Preparing Food and eating ? N  Using the Toilet? N  In the past six months, have you accidently leaked urine? N   Do you have problems with loss of bowel control? N  Managing your Medications? N  Managing your Finances? N  Housekeeping or managing your Housekeeping? N    Patient Care Team: Marcine Matar, MD as PCP - General (Internal Medicine) Jake Bathe, MD as PCP - Cardiology (Cardiology) Ollen Gross, MD as Consulting Physician (Orthopedic Surgery) Charolett Bumpers, MD as Consulting Physician (Gastroenterology)  Indicate any recent Medical Services you may have received from other than [facility] providers in the past year (date may be approximate).     Assessment:   This is a routine wellness examination for Annissa.  Hearing/Vision screen Hearing Screening - Comments:: Denies hearing difficulties   Vision Screening - Comments:: No vision problems; will schedule routine eye exam soon     Goals Addressed   None   Depression Screen    02/19/2023    2:51 PM 10/24/2022    3:23 PM 06/26/2022   11:00 AM 03/28/2022    3:05 PM 01/29/2022    8:49 AM 12/26/2021    2:24 PM 11/28/2021    2:55 PM  PHQ 2/9 Scores  PHQ - 2 Score 0 0 0 0 0 0 0  PHQ- 9 Score  0 2   1 1     Fall Risk    02/19/2023    2:53 PM 10/24/2022    3:51 PM 09/27/2022   10:08 AM 03/28/2022    3:05 PM 01/29/2022    8:49 AM  Fall Risk   Falls in the past year? 0 0 0 0 0  Number falls in past yr: 0 0 0 0 0  Injury with Fall? 0 0 0 0 0  Risk for fall due to : No Fall Risks No Fall Risks No Fall Risks No Fall Risks   Follow up Falls prevention discussed;Education provided;Falls evaluation completed Falls evaluation completed   Falls evaluation completed;Education provided;Falls prevention discussed    MEDICARE RISK AT HOME: Medicare Risk at Home Any stairs in or around the home?: No If so, are there any without handrails?: No Home free of loose throw rugs in walkways, pet beds, electrical cords, etc?: Yes Adequate lighting in  your home to reduce risk of falls?: Yes Life alert?: No Use of a cane, walker or w/c?:  No Grab bars in the bathroom?: No Shower chair or bench in shower?: No Elevated toilet seat or a handicapped toilet?: No  TIMED UP AND GO:  Was the test performed?  No    Cognitive Function:    01/29/2022    8:53 AM 09/13/2020   10:28 AM  MMSE - Mini Mental State Exam  Orientation to time 5 5  Orientation to Place 5 5  Registration 3 3  Attention/ Calculation 5 5  Recall 3 3  Language- name 2 objects 2 2  Language- repeat 1 1  Language- follow 3 step command 3 3  Language- read & follow direction 1 1  Write a sentence 1 1  Copy design 1 0  Total score 30 29        02/19/2023    2:55 PM  6CIT Screen  What Year? 0 points  What month? 0 points  What time? 0 points  Count back from 20 0 points  Months in reverse 0 points  Repeat phrase 0 points  Total Score 0 points    Immunizations Immunization History  Administered Date(s) Administered   Fluad Quad(high Dose 65+) 02/02/2019, 03/30/2022   Influenza,inj,Quad PF,6+ Mos 01/27/2015, 02/16/2017, 01/27/2021   Influenza-Unspecified 01/29/2013, 02/25/2016   Pneumococcal Conjugate-13 01/27/2015   Pneumococcal Polysaccharide-23 03/29/2016   Tdap 06/13/2020   Zoster Recombinant(Shingrix) 07/31/2016, 05/23/2021    TDAP status: Up to date  Flu Vaccine status: Due, Education has been provided regarding the importance of this vaccine. Advised may receive this vaccine at local pharmacy or Health Dept. Aware to provide a copy of the vaccination record if obtained from local pharmacy or Health Dept. Verbalized acceptance and understanding.  Pneumococcal vaccine status: Up to date  Covid-19 vaccine status: Information provided on how to obtain vaccines.   Qualifies for Shingles Vaccine? Yes   Zostavax completed No   Shingrix Completed?: Yes  Screening Tests Health Maintenance  Topic Date Due   INFLUENZA VACCINE  12/13/2022   COVID-19 Vaccine (1 - 2023-24 season) Never done   Medicare Annual Wellness (AWV)  02/19/2024    MAMMOGRAM  11/05/2024   Colonoscopy  04/24/2026   DTaP/Tdap/Td (2 - Td or Tdap) 06/13/2030   Pneumonia Vaccine 95+ Years old  Completed   DEXA SCAN  Completed   Hepatitis C Screening  Completed   Zoster Vaccines- Shingrix  Completed   HPV VACCINES  Aged Out    Health Maintenance  Health Maintenance Due  Topic Date Due   INFLUENZA VACCINE  12/13/2022   COVID-19 Vaccine (1 - 2023-24 season) Never done    Colorectal cancer screening: Type of screening: Colonoscopy. Completed 04/24/16. Repeat every 10 years  Mammogram status: Completed 11/06/22. Repeat every year  Bone Density status: Completed 03/22/21. Results reflect: Bone density results: NORMAL. Repeat every 5 years.  Lung Cancer Screening: (Low Dose CT Chest recommended if Age 50-80 years, 20 pack-year currently smoking OR have quit w/in 15years.) does not qualify.   Lung Cancer Screening Referral: n/a  Additional Screening:  Hepatitis C Screening: does qualify; Completed 01/27/15  Vision Screening: Recommended annual ophthalmology exams for early detection of glaucoma and other disorders of the eye. Is the patient up to date with their annual eye exam?  No  Who is the provider or what is the name of the office in which the patient attends annual eye exams? none If pt is  not established with a provider, would they like to be referred to a provider to establish care? No .   Dental Screening: Recommended annual dental exams for proper oral hygiene  Community Resource Referral / Chronic Care Management: CRR required this visit?  No   CCM required this visit?  No     Plan:     I have personally reviewed and noted the following in the patient's chart:   Medical and social history Use of alcohol, tobacco or illicit drugs  Current medications and supplements including opioid prescriptions. Patient is not currently taking opioid prescriptions. Functional ability and status Nutritional status Physical activity Advanced  directives List of other physicians Hospitalizations, surgeries, and ER visits in previous 12 months Vitals Screenings to include cognitive, depression, and falls Referrals and appointments  In addition, I have reviewed and discussed with patient certain preventive protocols, quality metrics, and best practice recommendations. A written personalized care plan for preventive services as well as general preventive health recommendations were provided to patient.     Kandis Fantasia [place], [place]   [id number]   After Visit Summary: (Mail) Due to this being a telephonic visit, the after visit summary with patients personalized plan was offered to patient via mail   Nurse Notes: No concerns at this time

## 2023-02-19 NOTE — Patient Instructions (Signed)
Ms. Khouri , Thank you for taking time to come for your Medicare Wellness Visit. I appreciate your ongoing commitment to your health goals. Please review the following plan we discussed and let me know if I can assist you in the future.   Referrals/Orders/Follow-Ups/Clinician Recommendations: Aim for 30 minutes of exercise or brisk walking, 6-8 glasses of water, and 5 servings of fruits and vegetables each day.  This is a list of the screening recommended for you and due dates:  Health Maintenance  Topic Date Due   Flu Shot  12/13/2022   COVID-19 Vaccine (1 - 2023-24 season) Never done   Medicare Annual Wellness Visit  02/19/2024   Mammogram  11/05/2024   Colon Cancer Screening  04/24/2026   DTaP/Tdap/Td vaccine (2 - Td or Tdap) 06/13/2030   Pneumonia Vaccine  Completed   DEXA scan (bone density measurement)  Completed   Hepatitis C Screening  Completed   Zoster (Shingles) Vaccine  Completed   HPV Vaccine  Aged Out    Advanced directives: (ACP Link)Information on Advanced Care Planning can be found at [facility] [place] Advance Health Care Directives Advance Health Care Directives ([email or web address]   Next Medicare Annual Wellness Visit scheduled for next year: Yes

## 2023-03-20 ENCOUNTER — Telehealth: Payer: Self-pay

## 2023-03-20 NOTE — Telephone Encounter (Signed)
Give her an appt with me.  I would need to evaluate to make sure she is physically able.  Last time I saw her in May, she was request walker because leg was giving out.

## 2023-03-20 NOTE — Telephone Encounter (Signed)
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 States she is needing this letter for new job she is starting through the [employer].

## 2023-03-20 NOTE — Telephone Encounter (Signed)
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  Pt stated she also needs TB test records that we may have.    Please advise.

## 2023-03-21 ENCOUNTER — Encounter: Payer: Self-pay | Admitting: Internal Medicine

## 2023-03-21 ENCOUNTER — Ambulatory Visit: Payer: 59 | Attending: Internal Medicine | Admitting: Internal Medicine

## 2023-03-21 VITALS — BP 106/66 | HR 94 | Temp 98.1°F | Ht 67.0 in | Wt 144.0 lb

## 2023-03-21 DIAGNOSIS — Z23 Encounter for immunization: Secondary | ICD-10-CM

## 2023-03-21 DIAGNOSIS — Z021 Encounter for pre-employment examination: Secondary | ICD-10-CM | POA: Diagnosis not present

## 2023-03-21 DIAGNOSIS — E782 Mixed hyperlipidemia: Secondary | ICD-10-CM

## 2023-03-21 MED ORDER — ATORVASTATIN CALCIUM 10 MG PO TABS: 10.0000 mg | ORAL_TABLET | Freq: Every day | ORAL | 0 refills | Status: DC

## 2023-03-21 NOTE — Telephone Encounter (Addendum)
Patient called for her letter made her aware she needs an appointment, scheduled appointment for this afternoon at 2:10.

## 2023-03-21 NOTE — Telephone Encounter (Signed)
Noted  

## 2023-03-21 NOTE — Progress Notes (Signed)
Patient ID: CASADY VOSHELL, female    DOB: 11-Dec-1950  MRN: [id number]  CC: Letter for School/Work (Letter for work clearing patient to work with children/Yes to flu vax.)   Subjective: Emily [name] is a 72 y.o. female who presents for UC visit. 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  She is doing this through a Advertising account executive with [employer] of [place].  Will not be doing anything strenuous.  May be lifting small babies and assist with monitoring children at play and reading to them.  Will be working 4 hrs a day. No falls; ambulates unassisted.  Knees have not been bothering her. No CP/SOB/LE edema. Does stretching exercises in a.m for 15 mins.  Rakes her own leaves in her yard. No issues with depression/anxiety.  Sleeping well, 8-9 hrs at nights Eating well.  Gets in fruits and veggies daily. Needs TB screening.  Reports having a positive TB skin test in the past and was told that from now on she should only have chest x-rays for screening.  She had negative chest x-ray in January of this year. No cough/fever or recent sick contacts.  Patient Active Problem List   Diagnosis Date Noted   Primary osteoarthritis of right knee 07/15/2021   Influenza vaccine refused 06/13/2020   Dermatitis 06/13/2020   Mixed hyperlipidemia 06/13/2020   Prediabetes 11/21/2012   Breast fibrocystic disorder 11/21/2012   Precordial chest pain 11/21/2012   Anxiety state 11/21/2012   Incidental lung nodule, > 3mm and < 8mm 02/15/2012     Current Outpatient Medications on File Prior to Visit  Medication Sig Dispense Refill   cetirizine (ZYRTEC ALLERGY) 10 MG tablet Take 1 tablet (10 mg total) by mouth daily. 30 tablet 11   No current facility-administered  medications on file prior to visit.    Allergies  Allergen Reactions   Latex Itching and Rash    Social History   Socioeconomic History   Marital status: Divorced    Spouse name: Not on file   Number of children: 2   Years of education: Not on file   Highest education level: Some college, no degree  Occupational History   Occupation: Day-care  Tobacco Use   Smoking status: Never   Smokeless tobacco: Never  Vaping Use   Vaping status: Never Used  Substance and Sexual Activity   Alcohol use: No   Drug use: No   Sexual activity: Not Currently    Partners: Male    Birth control/protection: Post-menopausal  Other Topics Concern   Not on file  Social History Narrative   Not on file   Social Determinants of Health   Financial Resource Strain: Low Risk  (02/19/2023)   Overall Financial Resource Strain (CARDIA)    Difficulty of Paying Living Expenses: Not hard at all  Food Insecurity: No Food Insecurity (02/19/2023)   Hunger Vital Sign    Worried About Running Out of Food in the Last Year: Never true    Ran Out of Food in the Last Year: Never true  Transportation Needs: No Transportation Needs (02/19/2023)   PRAPARE - Administrator, Civil Service (Medical): No    Lack of Transportation (Non-Medical): No  Physical Activity: Inactive (02/19/2023)   Exercise Vital Sign  Days of Exercise per Week: 0 days    Minutes of Exercise per Session: 0 min  Stress: No Stress Concern Present (02/19/2023)   [employer] of Occupational Health - Occupational Stress Questionnaire    Feeling of Stress : Not at all  Social Connections: Moderately Isolated (02/19/2023)   Social Connection and Isolation Panel [NHANES]    Frequency of Communication with Friends and Family: More than three times a week    Frequency of Social Gatherings with Friends and Family: Three times a week    Attends Religious Services: 1 to 4 times per year    Active Member of Clubs or Organizations: No     Attends Banker Meetings: Never    Marital Status: Divorced  Catering manager Violence: Not At Risk (02/19/2023)   Humiliation, Afraid, Rape, and Kick questionnaire    Fear of Current or Ex-Partner: No    Emotionally Abused: No    Physically Abused: No    Sexually Abused: No    Family History  Problem Relation Age of Onset   Stroke Mother    Hypertension Mother    Arthritis Mother    COPD Father 4       decsd due to lack of oxygen   Hypertension Father    Alcohol abuse Father    Gout Father    Emphysema Father    Diabetes Brother    Alcohol abuse Brother    Throat cancer Brother    Stroke Maternal Grandmother    Depression Paternal Grandmother     Past Surgical History:  Procedure Laterality Date   BREAST BIOPSY Right 05/28/2016   DENSE STROMAL FIBROSIS    COLONOSCOPY WITH PROPOFOL N/A 04/24/2016   Procedure: COLONOSCOPY WITH PROPOFOL;  Surgeon: Charolett Bumpers, MD;  Location: [facility];  Service: Endoscopy;  Laterality: N/A;   EYE SURGERY Bilateral    lens replacements   FRACTURE SURGERY Right 2003   fibula   surgery for fibroids  2000    ROS: Review of Systems Negative except as stated above  PHYSICAL EXAM: BP 106/66   Pulse 94   Temp 98.1 F (36.7 C) (Oral)   Ht 5\' 7"  (1.702 m)   Wt 144 lb (65.3 kg)   SpO2 98%   BMI 22.55 kg/m   Physical Exam  General appearance - alert, well appearing, elderly AAF and in no distress.   Mental status -patient is alert and oriented.  She answers questions appropriately. Eyes -Pink conjunctiva. Nose - normal and patent, no erythema, discharge or polyps Mouth - mucous membranes moist, pharynx normal without lesions Neck - supple, no significant adenopathy Chest - clear to auscultation, no wheezes, rales or rhonchi, symmetric air entry Heart - normal rate, regular rhythm, normal S1, S2, no murmurs, rubs, clicks or gallops Extremities -no lower extremity edema. MSK: Patient ambulates unassisted.   Trunk is slightly flexed forward.  Gait is slow but stable.    03/21/2023    2:18 PM 02/19/2023    2:51 PM 10/24/2022    3:23 PM  Depression screen PHQ 2/9  Decreased Interest 0 0 0  Down, Depressed, Hopeless 0 0 0  PHQ - 2 Score 0 0 0  Altered sleeping 0  0  Tired, decreased energy 0  0  Change in appetite 0  0  Feeling bad or failure about yourself  0  0  Trouble concentrating 0  0  Moving slowly or fidgety/restless 0  0  Suicidal thoughts 0  0  PHQ-9 Score 0  0  Difficult doing work/chores Not difficult at all        03/21/2023    2:18 PM 10/24/2022    3:24 PM 06/26/2022   11:01 AM 03/28/2022    3:06 PM  GAD 7 : Generalized Anxiety Score  Nervous, Anxious, on Edge 0 0 2 1  Control/stop worrying 0 0 2 0  Worry too much - different things 0  2 1  Trouble relaxing 0 0 1 0  Restless 0 0 0 0  Easily annoyed or irritable 0 0 1 1  Afraid - awful might happen 0 0 0 0  Total GAD 7 Score 0  8 3  Anxiety Difficulty Not difficult at all            Latest Ref Rng & Units 10/24/2022    4:00 PM 06/27/2022    8:45 AM 05/22/2022    7:00 PM  CMP  Glucose 70 - 99 mg/dL 563  99  875   BUN 8 - 27 mg/dL 27  15  24    Creatinine 0.57 - 1.00 mg/dL 6.43  3.29  5.18   Sodium 134 - 144 mmol/L 142  141  137   Potassium 3.5 - 5.2 mmol/L 3.9  4.1  4.1   Chloride 96 - 106 mmol/L 103  102  102   CO2 20 - 29 mmol/L 24  25  25    Calcium 8.7 - 10.3 mg/dL 9.3  9.2  9.2    Lipid Panel     Component Value Date/Time   CHOL 170 11/28/2021 1450   TRIG 66 11/28/2021 1450   HDL 87 11/28/2021 1450   CHOLHDL 2.0 11/28/2021 1450   CHOLHDL 2.1 02/23/2016 0918   VLDL 8 02/23/2016 0918   LDLCALC 70 11/28/2021 1450    CBC    Component Value Date/Time   WBC 6.7 10/24/2022 1600   WBC 6.1 05/22/2022 1900   RBC 4.31 10/24/2022 1600   RBC 4.26 05/22/2022 1900   HGB 11.8 10/24/2022 1600   HCT 37.4 10/24/2022 1600   PLT 266 10/24/2022 1600   MCV 87 10/24/2022 1600   MCH 27.4 10/24/2022 1600   MCH 27.9  05/22/2022 1900   MCHC 31.6 10/24/2022 1600   MCHC 32.5 05/22/2022 1900   RDW 12.9 10/24/2022 1600   LYMPHSABS 2.1 10/24/2022 1600   MONOABS 484 08/18/2015 1050   EOSABS 0.2 10/24/2022 1600   BASOSABS 0.0 10/24/2022 1600    ASSESSMENT AND PLAN:  1. Physical exam, pre-employment -I think patient would be able to perform the duties at the daycare center.  She reports that she would not be doing any strenuous activities.  I advised against any heavy lifting.  I have encouraged her to continue healthy eating habits, regular exercise. I have written a letter for her as she requested.  I also indicated in the letter that she has history of positive TB skin test in the past and had negative chest x-ray earlier this year.  I included a copy of the chest x-ray report.  2. Mixed hyperlipidemia Patient requested refill on Lipitor. - atorvastatin (LIPITOR) 10 MG tablet; Take 1 tablet (10 mg total) by mouth daily.  Dispense: 90 tablet; Refill: 0  3. Encounter for immunization - Flu Vaccine Trivalent High Dose (Fluad)   Patient was given the opportunity to ask questions.  Patient verbalized understanding of the plan and was able to repeat key elements of the plan.   This documentation was  completed using Paediatric nurse.  Any transcriptional errors are unintentional.  Orders Placed This Encounter  Procedures   Flu Vaccine Trivalent High Dose (Fluad)     Requested Prescriptions   Signed Prescriptions Disp Refills   atorvastatin (LIPITOR) 10 MG tablet 90 tablet 0    Sig: Take 1 tablet (10 mg total) by mouth daily.    No follow-ups on file.  Jonah Blue, MD, FACP

## 2023-05-07 ENCOUNTER — Ambulatory Visit: Payer: 59 | Admitting: Internal Medicine

## 2023-06-13 ENCOUNTER — Ambulatory Visit: Payer: 59 | Admitting: Podiatry

## 2023-06-26 ENCOUNTER — Encounter: Payer: Self-pay | Admitting: Podiatry

## 2023-06-26 ENCOUNTER — Ambulatory Visit (INDEPENDENT_AMBULATORY_CARE_PROVIDER_SITE_OTHER): Payer: 59 | Admitting: Podiatry

## 2023-06-26 DIAGNOSIS — M779 Enthesopathy, unspecified: Secondary | ICD-10-CM | POA: Diagnosis not present

## 2023-06-26 DIAGNOSIS — Q828 Other specified congenital malformations of skin: Secondary | ICD-10-CM | POA: Diagnosis not present

## 2023-06-26 DIAGNOSIS — M7752 Other enthesopathy of left foot: Secondary | ICD-10-CM

## 2023-06-26 NOTE — Progress Notes (Signed)
Subjective:   Patient ID: Emily [name], female   DOB: 73 y.o.   MRN: [id number]   HPI Patient presents with concerns about left foot inflammation and pain and wants it checked   ROS      Objective:  Physical Exam  Neuro vascular status intact inflammation left forefoot localized small lesion formation but does not appear to be fully the cause of her problem     Assessment:  Inflammatory capsulitis lesion formation left     Plan:  H&P reviewed and I have recommended cushioning for the area and shoe gear modifications and patient will be seen back as symptoms indicate I do not see anything at this point she needs to be concerned about

## 2023-07-04 ENCOUNTER — Ambulatory Visit: Payer: 59 | Admitting: Internal Medicine

## 2023-07-15 ENCOUNTER — Telehealth (INDEPENDENT_AMBULATORY_CARE_PROVIDER_SITE_OTHER): Payer: Self-pay | Admitting: Primary Care

## 2023-07-15 NOTE — Telephone Encounter (Signed)
 Called pt but could not reach the. Could not leave a VM.

## 2023-07-16 ENCOUNTER — Encounter (INDEPENDENT_AMBULATORY_CARE_PROVIDER_SITE_OTHER): Payer: Self-pay | Admitting: Primary Care

## 2023-07-16 ENCOUNTER — Ambulatory Visit (INDEPENDENT_AMBULATORY_CARE_PROVIDER_SITE_OTHER): Payer: 59 | Admitting: Primary Care

## 2023-07-16 VITALS — BP 112/70 | HR 82 | Temp 98.1°F | Resp 12 | Ht 62.0 in | Wt 144.0 lb

## 2023-07-16 DIAGNOSIS — M79641 Pain in right hand: Secondary | ICD-10-CM

## 2023-07-16 MED ORDER — DICLOFENAC SODIUM 1 % EX GEL: 2.0000 g | Freq: Four times a day (QID) | CUTANEOUS | 1 refills | Status: AC

## 2023-07-16 NOTE — Progress Notes (Unsigned)
 [facility]  Emily [name], is a 73 y.o. female  UJW:[id number]  FAO:[id number]  DOB - October 28, 1950  Chief Complaint  Patient presents with   Follow-up       Subjective:   Emily [name] is a 73 y.o. female here today for a follow up visit. Unclear what actual problem she is here for. 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 Than she c/o a trip she took [place] and had diarrhea question how long ago probably 6 months or more. Than said her breast hurts sometimes. Patient has No headache, No chest pain, No abdominal pain - No Nausea, No new weakness tingling or numbness, No Cough - shortness of breath HPI  No problems updated.  Comprehensive ROS Pertinent positive and negative noted in HPI   Allergies  Allergen Reactions   Latex Itching and Rash    Past Medical History:  Diagnosis Date   Anemia    iron runs low   Anxiety    Anxiety state, unspecified 11/21/2012   Breast fibrocystic disorder 11/21/2012   Bronchitis    Cataract    Fibroid    History of chicken pox    History of uterine fibroid    Incidental lung nodule, > 3mm and < 8mm 02/15/2012   5mm RUL subpleural nodule found on CXR screening for TB due to h/o TB exposure as a child with positive ppd.  Recheck CT scan in 6-12 mos (March - Sept 2014) Repeat CT scan 01/2013 shows: Stable subpleural nodule in the right upper lobe measuring 5 mm. If this patient is at low risk for lung cancer then no further workup is necessary.  If this patient is at increased risk for lung cancer then foll   Nontraumatic tear of skin    right side of vagina skin since 06-12-15, small amount of blood when wipes   Precordial chest pain 11/21/2012   Normal stress echocardiogram 11/2010  CCTA 07/24/2022: CAC score 0, no CAD   Prediabetes 11/21/2012   STD (sexually transmitted disease)    chlamydia treated, hsv vaginal   Tuberculosis    exposed as a young child     Current Outpatient Medications on File Prior to Visit  Medication Sig Dispense Refill   atorvastatin (LIPITOR) 10 MG tablet Take 1 tablet (10 mg total) by mouth daily. 90 tablet 0   cetirizine (ZYRTEC ALLERGY) 10 MG tablet Take 1 tablet (10 mg total) by mouth daily. 30 tablet 11   No current facility-administered medications on file prior to visit.   Health Maintenance  Topic Date Due   COVID-19 Vaccine (1 - 2024-25 season) 07/31/2024*   Medicare Annual Wellness Visit  02/19/2024   Mammogram  11/05/2024   Colon Cancer Screening  04/24/2026   DTaP/Tdap/Td vaccine (2 - Td or Tdap) 06/13/2030   Pneumonia Vaccine  Completed   Flu Shot  Completed   DEXA scan (bone density measurement)  Completed   Hepatitis C Screening  Completed   Zoster (Shingles) Vaccine  Completed   HPV Vaccine  Aged Out  *Topic was postponed. The date shown is not the original due date.    Objective:   Vitals:   07/16/23 1616  BP: 112/70  Pulse: 82  Resp: 12  Temp: 98.1 F (36.7 C)  TempSrc: Oral  SpO2: 99%  Weight: 144 lb (65.3 kg)  Height: 5\' 2"  (1.575 m)   Physical Exam Vitals reviewed.  HENT:  Head: Normocephalic.     Right Ear: Tympanic membrane and external ear normal.     Left Ear: Tympanic membrane and external ear normal.     Nose: Nose normal.  Eyes:     Extraocular Movements: Extraocular movements intact.  Cardiovascular:     Rate and Rhythm: Normal rate and regular rhythm.  Pulmonary:     Effort: Pulmonary effort is normal.     Breath sounds: Normal breath sounds.  Abdominal:     General: Bowel sounds are normal.  Musculoskeletal:        General: Normal range of motion.     Cervical back: Normal range of motion.  Skin:    General: Skin is warm and dry.  Neurological:     Mental Status: She is alert. Mental status is at baseline.  Psychiatric:        Cognition and Memory: Cognition is impaired. Memory is impaired.       Assessment & Plan  Kloee was seen today for  follow-up.  Diagnoses and all orders for this visit:  Right hand pain -     diclofenac Sodium (VOLTAREN ARTHRITIS PAIN) 1 % GEL; Apply 2 g topically 4 (four) times daily.      Patient have been counseled extensively about nutrition and exercise. Other issues discussed during this visit include: low cholesterol diet, weight control and daily exercise, foot care, annual eye examinations at Ophthalmology, importance of adherence with medications and regular follow-up. We also discussed long term complications of uncontrolled diabetes and hypertension.    follow-ups with PCP The patient was given clear instructions to go to ER or return to medical center if symptoms don't improve, worsen or new problems develop. The patient verbalized understanding. The patient was told to call to get lab results if they haven't heard anything in the next week.   This note has been created with Education officer, environmental. Any transcriptional errors are unintentional.   Grayce Sessions, NP 07/16/2023, 5:05 PM

## 2023-07-26 DIAGNOSIS — H524 Presbyopia: Secondary | ICD-10-CM | POA: Diagnosis not present

## 2023-07-26 DIAGNOSIS — H01001 Unspecified blepharitis right upper eyelid: Secondary | ICD-10-CM | POA: Diagnosis not present

## 2023-07-26 DIAGNOSIS — H01004 Unspecified blepharitis left upper eyelid: Secondary | ICD-10-CM | POA: Diagnosis not present

## 2023-07-26 DIAGNOSIS — H35361 Drusen (degenerative) of macula, right eye: Secondary | ICD-10-CM | POA: Diagnosis not present

## 2023-07-26 DIAGNOSIS — B880 Other acariasis: Secondary | ICD-10-CM | POA: Diagnosis not present

## 2023-07-26 DIAGNOSIS — E119 Type 2 diabetes mellitus without complications: Secondary | ICD-10-CM | POA: Diagnosis not present

## 2023-07-26 LAB — HM DIABETES EYE EXAM

## 2023-08-21 ENCOUNTER — Encounter: Payer: Self-pay | Admitting: Podiatry

## 2023-08-21 ENCOUNTER — Emergency Department (HOSPITAL_COMMUNITY)
Admission: EM | Admit: 2023-08-21 | Discharge: 2023-08-21 | Disposition: A | Discharge status: Home / Self Care | Attending: Emergency Medicine | Admitting: Emergency Medicine

## 2023-08-21 ENCOUNTER — Ambulatory Visit (INDEPENDENT_AMBULATORY_CARE_PROVIDER_SITE_OTHER): Admitting: Podiatry

## 2023-08-21 ENCOUNTER — Other Ambulatory Visit: Payer: Self-pay

## 2023-08-21 ENCOUNTER — Encounter (HOSPITAL_COMMUNITY): Payer: Self-pay | Admitting: *Deleted

## 2023-08-21 DIAGNOSIS — H1033 Unspecified acute conjunctivitis, bilateral: Secondary | ICD-10-CM | POA: Diagnosis not present

## 2023-08-21 DIAGNOSIS — M7751 Other enthesopathy of right foot: Secondary | ICD-10-CM | POA: Diagnosis not present

## 2023-08-21 DIAGNOSIS — H5713 Ocular pain, bilateral: Secondary | ICD-10-CM | POA: Diagnosis present

## 2023-08-21 DIAGNOSIS — M779 Enthesopathy, unspecified: Secondary | ICD-10-CM | POA: Diagnosis not present

## 2023-08-21 DIAGNOSIS — Z91040 Latex allergy status: Secondary | ICD-10-CM | POA: Insufficient documentation

## 2023-08-21 MED ORDER — TETRACAINE HCL 0.5 % OP SOLN
2.0000 [drp] | Freq: Once | OPHTHALMIC | Status: DC
  Filled 2023-08-21: qty 4

## 2023-08-21 MED ORDER — POLYMYXIN B-TRIMETHOPRIM 10000-0.1 UNIT/ML-% OP SOLN: 1.0000 [drp] | OPHTHALMIC | 0 refills | Status: AC

## 2023-08-21 MED ORDER — FLUORESCEIN SODIUM 1 MG OP STRP
1.0000 | ORAL_STRIP | Freq: Once | OPHTHALMIC | Status: DC
  Filled 2023-08-21: qty 1

## 2023-08-21 NOTE — ED Triage Notes (Signed)
 Pt was using eye drops in one eye and believes she may have contaminated her other eye. 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 Tearing in both eyes, drainage noted to left eye

## 2023-08-21 NOTE — Discharge Instructions (Signed)
 We are putting you on eyedrops to help treat your eye infection.  This is contagious.  Be sure to wash your hands with soap and water frequently.  Follow-up with your eye specialist.

## 2023-08-21 NOTE — ED Provider Notes (Signed)
 [place] EMERGENCY DEPARTMENT AT [facility] Provider Note   CSN: [id number] Arrival date & time: 08/21/23  0556     History  Chief Complaint  Patient presents with   Eye Pain    Emily [name] is a 73 y.o. female.  HPI 73 year old female present for conjunctivitis.  She has been using Xdemvy eyedrops for the past couple weeks.  Over the last couple days she think she has developed an infection because one of the eyedrops missed and then she pushed it from her face into her eye.  Both eyes have been red appearing and some drainage.  Maybe some mild matting.  No eye pain, photophobia, vision loss or decreased vision.  She does not wear contacts.  Home Medications Prior to Admission medications   Medication Sig Start Date End Date Taking? Authorizing Provider  trimethoprim-polymyxin b (POLYTRIM) ophthalmic solution Place 1 drop into both eyes every 3 (three) hours while awake for 7 days. 08/21/23 08/28/23 Yes Pricilla Loveless, MD  atorvastatin (LIPITOR) 10 MG tablet Take 1 tablet (10 mg total) by mouth daily. 03/21/23   Marcine Matar, MD  cetirizine (ZYRTEC ALLERGY) 10 MG tablet Take 1 tablet (10 mg total) by mouth daily. 10/24/22   Mayers, Cari S, PA-C  diclofenac Sodium (VOLTAREN ARTHRITIS PAIN) 1 % GEL Apply 2 g topically 4 (four) times daily. 07/16/23   Grayce Sessions, NP      Allergies    Latex    Review of Systems   Review of Systems  Eyes:  Positive for discharge and redness. Negative for photophobia, pain and visual disturbance.    Physical Exam Updated Vital Signs BP 116/69   Pulse 96   Temp 98.4 F (36.9 C) (Oral)   Resp 14   SpO2 98%  Physical Exam Vitals and nursing note reviewed.  Constitutional:      Appearance: She is well-developed.  HENT:     Head: Normocephalic and atraumatic.  Eyes:     Extraocular Movements: Extraocular movements intact.     Pupils: Pupils are equal, round, and reactive to light.     Comments: Both eyes have some  minimal injection.  No periorbital swelling.  There is a thin film of discharge at both eyelids.  Pulmonary:     Effort: Pulmonary effort is normal.  Neurological:     Mental Status: She is alert.     ED Results / Procedures / Treatments   Labs (all labs ordered are listed, but only abnormal results are displayed) Labs Reviewed - No data to display  EKG None  Radiology No results found.  Procedures Procedures    Medications Ordered in ED Medications - No data to display  ED Course/ Medical Decision Making/ A&P                                 Medical Decision Making Risk Prescription drug management.   Presentation is consistent with conjunctivitis.  Vital signs are reassuring.  She has no vision changes or pain.  She does not feel like she actually scratched her eye, she is just worried she contaminated it.  Could be viral but will cover with Polytrim.  She already plans to call her ophthalmologist today.  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        Final Clinical Impression(s) / ED  Diagnoses Final diagnoses:  Acute conjunctivitis of both eyes, unspecified acute conjunctivitis type    Rx / DC Orders ED Discharge Orders          Ordered    trimethoprim-polymyxin b (POLYTRIM) ophthalmic solution  Every  3 hours while awake        08/21/23 0909              Pricilla Loveless, MD 08/21/23 [phone]

## 2023-08-22 NOTE — Progress Notes (Signed)
 Subjective:   Patient ID: Emily [name], female   DOB: 73 y.o.   MRN: [id number]   HPI Patient states with health history that she would like her right foot checked as she wants to make sure there is no breakdown of tissue or pathology   ROS      Objective:  Physical Exam  Neurovascular status intact no change in health history with good digital perfusion noted and area plantar left does have a fissure but there is no erythema edema or drainage associated with     Assessment:  Chronic plantar fissure right appears to be well-healing previous treatment     Plan:  H&P reviewed condition recommended continue daily inspections of feet and wearing cushioning and not going barefoot.  Patient will be seen back to recheck all questions answered today

## 2023-08-28 DIAGNOSIS — H01004 Unspecified blepharitis left upper eyelid: Secondary | ICD-10-CM | POA: Diagnosis not present

## 2023-08-28 DIAGNOSIS — H10023 Other mucopurulent conjunctivitis, bilateral: Secondary | ICD-10-CM | POA: Diagnosis not present

## 2023-08-28 DIAGNOSIS — B880 Other acariasis: Secondary | ICD-10-CM | POA: Diagnosis not present

## 2023-08-28 DIAGNOSIS — H02881 Meibomian gland dysfunction right upper eyelid: Secondary | ICD-10-CM | POA: Diagnosis not present

## 2023-08-28 DIAGNOSIS — H02884 Meibomian gland dysfunction left upper eyelid: Secondary | ICD-10-CM | POA: Diagnosis not present

## 2023-08-28 DIAGNOSIS — H01001 Unspecified blepharitis right upper eyelid: Secondary | ICD-10-CM | POA: Diagnosis not present

## 2023-08-28 LAB — AMB RESULTS CONSOLE CBG: Glucose: 148

## 2023-08-28 NOTE — Progress Notes (Signed)
 122/77 non fasting glucose 148. Patient came to screening at [facility] to check blood pressure and glucose. Upon chart review pt's last A1C was (6.2) 03/2022. Today machine read error. So patient stated she would come back at a later time. Mobile calendar was given. NO SDOH resources needed currently.

## 2023-10-17 DIAGNOSIS — H5319 Other subjective visual disturbances: Secondary | ICD-10-CM | POA: Diagnosis not present

## 2023-10-28 ENCOUNTER — Ambulatory Visit (HOSPITAL_COMMUNITY)
Admission: EM | Admit: 2023-10-28 | Discharge: 2023-10-28 | Disposition: A | Discharge status: Home / Self Care | Attending: Emergency Medicine | Admitting: Emergency Medicine

## 2023-10-28 ENCOUNTER — Encounter (HOSPITAL_COMMUNITY): Payer: Self-pay | Admitting: *Deleted

## 2023-10-28 ENCOUNTER — Other Ambulatory Visit: Payer: Self-pay

## 2023-10-28 DIAGNOSIS — R21 Rash and other nonspecific skin eruption: Secondary | ICD-10-CM | POA: Diagnosis not present

## 2023-10-28 DIAGNOSIS — L237 Allergic contact dermatitis due to plants, except food: Secondary | ICD-10-CM

## 2023-10-28 MED ORDER — PREDNISONE 10 MG (21) PO TBPK: ORAL_TABLET | Freq: Every day | ORAL | 0 refills | Status: DC

## 2023-10-28 MED ORDER — TRIAMCINOLONE ACETONIDE 0.1 % EX CREA: 1.0000 | TOPICAL_CREAM | Freq: Two times a day (BID) | CUTANEOUS | 0 refills | Status: AC

## 2023-10-28 NOTE — ED Provider Notes (Addendum)
 [facility]    CSN: [id number] Arrival date & time: 10/28/23  1206      History   Chief Complaint Chief Complaint  Patient presents with   Poison Ivy    HPI Emily [name] is a 73 y.o. female.   Patient presents with rash to her right wrist, central chest, face, and right ear that began approximately 2 to 3 days ago.  Patient states that 4 days ago she was working in the yard and was exposed to poison ivy.  Patient states that she began to notice some small blisters to her wrist.   Patient states that last year when she was exposed to poison ivy she began to have a blistering rash then as well.  Patient reports the rash is itchy.  Patient reports she has been applying calamine lotion without relief.  The history is provided by the patient and medical records.  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  Recheck CT scan in 6-12 mos (March - Sept 2014) Repeat CT scan 01/2013 shows: Stable subpleural nodule in the right upper lobe measuring 5 mm. If this patient is at low risk for lung cancer then no further workup is necessary.  If this patient is at increased risk for lung cancer then foll   Nontraumatic tear of skin    right side of vagina skin since 06-12-15, small amount of blood when wipes   Precordial chest pain 11/21/2012   Normal stress echocardiogram 11/2010  CCTA 07/24/2022: CAC score 0, no CAD   Prediabetes 11/21/2012   STD (sexually transmitted disease)    chlamydia treated, hsv vaginal   Tuberculosis    exposed as a young child    Patient Active Problem List    Diagnosis Date Noted   Primary osteoarthritis of right knee 07/15/2021   Influenza vaccine refused 06/13/2020   Dermatitis 06/13/2020   Mixed hyperlipidemia 06/13/2020   Prediabetes 11/21/2012   Breast fibrocystic disorder 11/21/2012   Precordial chest pain 11/21/2012   Anxiety state 11/21/2012   Incidental lung nodule, > 3mm and < 8mm 02/15/2012    Past Surgical History:  Procedure Laterality Date   BREAST BIOPSY Right 05/28/2016   DENSE STROMAL FIBROSIS    COLONOSCOPY WITH PROPOFOL  N/A 04/24/2016   Procedure: COLONOSCOPY WITH PROPOFOL ;  Surgeon: Garrett Kallman, MD;  Location: [facility];  Service: Endoscopy;  Laterality: N/A;   EYE SURGERY Bilateral    lens replacements   FRACTURE SURGERY Right 2003   fibula   surgery for fibroids  2000    OB History     Gravida  2   Para  2   Term  2   Preterm      AB      Living  2      SAB      IAB      Ectopic      Multiple      Live Births  2  Home Medications    Prior to Admission medications   Medication Sig Start Date End Date Taking? Authorizing Provider  atorvastatin  (LIPITOR) 10 MG tablet Take 1 tablet (10 mg total) by mouth daily. 03/21/23  Yes Lawrance Presume, MD  predniSONE  (STERAPRED UNI-PAK 21 TAB) 10 MG (21) TBPK tablet Take by mouth daily. Take 6 tabs by mouth daily  for 2 days, then 5 tabs for 2 days, then 4 tabs for 2 days, then 3 tabs for 2 days, 2 tabs for 2 days, then 1 tab by mouth daily for 2 days 10/28/23  Yes Rosevelt Constable, Donita Furrow A, NP  triamcinolone  cream (KENALOG ) 0.1 % Apply 1 Application topically 2 (two) times daily. 10/28/23  Yes Rosevelt Constable, Sricharan Lacomb A, NP  cetirizine  (ZYRTEC  ALLERGY) 10 MG tablet Take 1 tablet (10 mg total) by mouth daily. 10/24/22   Mayers, Cari S, PA-C  diclofenac  Sodium (VOLTAREN  ARTHRITIS PAIN) 1 % GEL Apply 2 g topically 4 (four) times daily. 07/16/23   Marius Siemens, NP    Family History Family History  Problem Relation Age of Onset   Stroke  Mother    Hypertension Mother    Arthritis Mother    COPD Father 67       decsd due to lack of oxygen   Hypertension Father    Alcohol abuse Father    Gout Father    Emphysema Father    Diabetes Brother    Alcohol abuse Brother    Throat cancer Brother    Stroke Maternal Grandmother    Depression Paternal Grandmother     Social History Social History   Tobacco Use   Smoking status: Never   Smokeless tobacco: Never  Vaping Use   Vaping status: Never Used  Substance Use Topics   Alcohol use: No   Drug use: No     Allergies   Latex   Review of Systems Review of Systems  Per HPI  Physical Exam Triage Vital Signs ED Triage Vitals  Encounter Vitals Group     BP 10/28/23 1239 108/66     Girls Systolic BP Percentile --      Girls Diastolic BP Percentile --      Boys Systolic BP Percentile --      Boys Diastolic BP Percentile --      Pulse Rate 10/28/23 1239 85     Resp 10/28/23 1239 18     Temp 10/28/23 1239 98.3 F (36.8 C)     Temp Source 10/28/23 1239 Oral     SpO2 10/28/23 1239 96 %     Weight --      Height --      Head Circumference --      Peak Flow --      Pain Score 10/28/23 1240 0     Pain Loc --      Pain Education --      Exclude from Growth Chart --    No data found.  Updated Vital Signs BP 108/66   Pulse 85   Temp 98.3 F (36.8 C) (Oral)   Resp 18   SpO2 96%   Visual Acuity Right Eye Distance:   Left Eye Distance:   Bilateral Distance:    Right Eye Near:   Left Eye Near:    Bilateral Near:     Physical Exam Vitals and nursing note reviewed.  Constitutional:      General: She is awake. She is not in acute distress.    Appearance:  Normal appearance. She is well-developed and well-groomed. She is not ill-appearing.   Skin:    General: Skin is warm and dry.     Findings: Rash present. Rash is macular and papular.     Comments: Blistering rash noted to anterior and posterior aspect of right wrist.  Maculopapular rash noted to  central chest, chin, and right ear consistent with contact dermatitis.   Neurological:     Mental Status: She is alert.   Psychiatric:        Behavior: Behavior is cooperative.      UC Treatments / Results  Labs (all labs ordered are listed, but only abnormal results are displayed) Labs Reviewed - No data to display  EKG   Radiology No results found.  Procedures Procedures (including critical care time)  Medications Ordered in UC Medications - No data to display  Initial Impression / Assessment and Plan / UC Course  I have reviewed the triage vital signs and the nursing notes.  Pertinent labs & imaging results that were available during my care of the patient were reviewed by me and considered in my medical decision making (see chart for details).     Patient is well-appearing.  Vitals are stable.  Upon assessment there is a blistering rash noted to the anterior and posterior aspect of the right wrist.  Maculopapular rash noted to the central chest, chin, and right ear consistent with contact dermatitis from poison ivy.  Prescribed prednisone  taper and triamcinolone  cream for rash.  Discussed follow-up and return precautions. Final Clinical Impressions(s) / UC Diagnoses   Final diagnoses:  Poison ivy dermatitis  Blistering rash     Discharge Instructions      Starting prednisone  taper as instructed to on the package. Apply triamcinolone  cream twice daily to the areas on your arm and chest.  Avoid applying this to your face as this can cause skin bleaching. Return here or follow-up with your primary care provider if symptoms persist or worsen.     ED Prescriptions     Medication Sig Dispense Auth. Provider   triamcinolone  cream (KENALOG ) 0.1 % Apply 1 Application topically 2 (two) times daily. 30 g Maisen Klingler A, NP   predniSONE  (STERAPRED UNI-PAK 21 TAB) 10 MG (21) TBPK tablet Take by mouth daily. Take 6 tabs by mouth daily  for 2 days, then 5 tabs for  2 days, then 4 tabs for 2 days, then 3 tabs for 2 days, 2 tabs for 2 days, then 1 tab by mouth daily for 2 days 42 tablet Levora Reas A, NP      PDMP not reviewed this encounter.   Karon Packer, NP 10/28/23 1344    Levora Reas A, NP 10/28/23 1345

## 2023-10-28 NOTE — ED Triage Notes (Signed)
 Reports working in yard 4 days ago, exposing herself to poison ivy. States started with small blisters to right anterior wrist area and chest approx 2-3 days ago.

## 2023-10-28 NOTE — Discharge Instructions (Signed)
 Starting prednisone  taper as instructed to on the package. Apply triamcinolone  cream twice daily to the areas on your arm and chest.  Avoid applying this to your face as this can cause skin bleaching. Return here or follow-up with your primary care provider if symptoms persist or worsen.

## 2023-10-31 ENCOUNTER — Other Ambulatory Visit: Payer: Self-pay | Admitting: Internal Medicine

## 2023-10-31 ENCOUNTER — Telehealth: Payer: Self-pay | Admitting: Internal Medicine

## 2023-10-31 DIAGNOSIS — N644 Mastodynia: Secondary | ICD-10-CM

## 2023-10-31 DIAGNOSIS — Z1231 Encounter for screening mammogram for malignant neoplasm of breast: Secondary | ICD-10-CM

## 2023-10-31 NOTE — Telephone Encounter (Signed)
 Order placed

## 2023-10-31 NOTE — Telephone Encounter (Signed)
 Copied from CRM [id number]. Topic: Clinical - Request for Lab/Test Order >> Oct 31, 2023 10:02 AM Elle L wrote:  Reason for CRM: The patient states that she spoke to [facility] and they are requesting an order for a diagnostic mammogram and a diagnostic ultrasound due to a pain that she has been having in her left breast. I did confirm that the patient is not currently experiencing pain. The patient's call back number is [phone].

## 2023-10-31 NOTE — Addendum Note (Signed)
 Addended by: Concetta Dee B on: 10/31/2023 06:47 PM   Modules accepted: Orders

## 2023-11-01 NOTE — Telephone Encounter (Signed)
 Copied from CRM [id number]. Topic: General - Other >> Nov 01, 2023 11:50 AM Baldemar Lev wrote:  Reason for CRM: Pt returned missed call to [facility], advised to send CRM by CAL.

## 2023-11-01 NOTE — Telephone Encounter (Signed)
 Called but no answer. LVM to call back.

## 2023-11-01 NOTE — Telephone Encounter (Signed)
 Called & spoke. Verified name & DOB. Informed that diagnostic mammogram referral has been placed. Patient expressed verbal understanding.

## 2023-11-05 LAB — HEMOGLOBIN A1C: Hemoglobin A1C: 6.1

## 2023-11-05 LAB — AMB RESULTS CONSOLE CBG: Glucose: 170

## 2023-11-05 NOTE — Progress Notes (Signed)
 Declined Sdoh.

## 2023-12-05 ENCOUNTER — Other Ambulatory Visit: Payer: Self-pay | Admitting: Internal Medicine

## 2023-12-05 DIAGNOSIS — N644 Mastodynia: Secondary | ICD-10-CM

## 2023-12-10 ENCOUNTER — Ambulatory Visit
Admission: RE | Admit: 2023-12-10 | Discharge: 2023-12-10 | Disposition: A | Discharge status: Home / Self Care | Source: Ambulatory Visit | Attending: Internal Medicine | Admitting: Internal Medicine

## 2023-12-10 DIAGNOSIS — R92333 Mammographic heterogeneous density, bilateral breasts: Secondary | ICD-10-CM | POA: Diagnosis not present

## 2023-12-10 DIAGNOSIS — N644 Mastodynia: Secondary | ICD-10-CM

## 2023-12-10 DIAGNOSIS — Z1231 Encounter for screening mammogram for malignant neoplasm of breast: Secondary | ICD-10-CM

## 2023-12-11 ENCOUNTER — Ambulatory Visit: Payer: Self-pay | Admitting: Internal Medicine

## 2023-12-11 NOTE — Telephone Encounter (Signed)
 Copied from CRM #[id number]. Topic: Clinical - Lab/Test Results >> Dec 11, 2023  4:44 PM Tobias L wrote:  Reason for CRM: Patient called for mammogram results. Patient requesting call back, [phone]

## 2024-02-03 ENCOUNTER — Other Ambulatory Visit: Payer: Self-pay | Admitting: Internal Medicine

## 2024-02-03 DIAGNOSIS — E782 Mixed hyperlipidemia: Secondary | ICD-10-CM

## 2024-02-04 NOTE — Telephone Encounter (Signed)
 Requested Prescriptions  Pending Prescriptions Disp Refills   atorvastatin  (LIPITOR) 10 MG tablet [Pharmacy Med Name: ATORVASTATIN  10 MG TABLET] 90 tablet 0    Sig: TAKE 1 TABLET BY MOUTH EVERY DAY     Cardiovascular:  Antilipid - Statins Failed - 02/04/2024 12:03 PM      Failed - Lipid Panel in normal range within the last 12 months    Cholesterol, Total  Date Value Ref Range Status  11/28/2021 170 100 - 199 mg/dL Final   LDL Chol Calc (NIH)  Date Value Ref Range Status  11/28/2021 70 0 - 99 mg/dL Final   HDL  Date Value Ref Range Status  11/28/2021 87 >39 mg/dL Final   Triglycerides  Date Value Ref Range Status  11/28/2021 66 0 - 149 mg/dL Final         Passed - Patient is not pregnant      Passed - Valid encounter within last 12 months    Recent Outpatient Visits           6 months ago Right hand pain   [facility] Celestia Rosaline SQUIBB, NP   10 months ago Physical exam, pre-employment   [facility] [place] Barnie NOVAK, MD   1 year ago Primary osteoarthritis of right knee   [facility] [place] Barnie NOVAK, MD   1 year ago Thinning hair   [facility] [place] Barnie NOVAK, MD   1 year ago Vaginal discharge   [facility] [place]. [facility] [place], Tom Bean, PA-C

## 2024-02-25 ENCOUNTER — Ambulatory Visit: Payer: 59

## 2024-02-25 ENCOUNTER — Ambulatory Visit

## 2024-02-25 ENCOUNTER — Ambulatory Visit: Attending: Internal Medicine

## 2024-02-25 VITALS — BP 108/66 | Ht 62.0 in | Wt 140.0 lb

## 2024-02-25 DIAGNOSIS — Z0000 Encounter for general adult medical examination without abnormal findings: Secondary | ICD-10-CM | POA: Diagnosis not present

## 2024-02-25 NOTE — Patient Instructions (Signed)
 Emily [name],  Thank you for taking the time for your Medicare Wellness Visit. I appreciate your continued commitment to your health goals. Please review the care plan we discussed, and feel free to reach out if I can assist you further.  Medicare recommends these wellness visits once per year to help you and your care team stay ahead of potential health issues. These visits are designed to focus on prevention, allowing your provider to concentrate on managing your acute and chronic conditions during your regular appointments.  Please note that Annual Wellness Visits do not include a physical exam. Some assessments may be limited, especially if the visit was conducted virtually. If needed, we may recommend a separate in-person follow-up with your provider.  Ongoing Care Seeing your primary care provider every 3 to 6 months helps us  monitor your health and provide consistent, personalized care.   Referrals If a referral was made during today's visit and you haven't received any updates within two weeks, please contact the referred provider directly to check on the status.  Recommended Screenings:  Health Maintenance  Topic Date Due   Flu Shot  12/13/2023   COVID-19 Vaccine (1 - 2025-26 season) Never done   Medicare Annual Wellness Visit  02/24/2025   Breast Cancer Screening  12/09/2025   Colon Cancer Screening  04/24/2026   DTaP/Tdap/Td vaccine (2 - Td or Tdap) 06/13/2030   Pneumococcal Vaccine for age over 85  Completed   DEXA scan (bone density measurement)  Completed   Hepatitis C Screening  Completed   Zoster (Shingles) Vaccine  Completed   Meningitis B Vaccine  Aged Out       02/25/2024    5:09 PM  Advanced Directives  Does Patient Have a Medical Advance Directive? Yes  Type of Estate agent of [place];Living will  Does patient want to make changes to medical advance directive? No - Patient declined  Copy of Healthcare Power of Attorney in Chart? No - copy  requested   Advance Care Planning is important because it: Ensures you receive medical care that aligns with your values, goals, and preferences. Provides guidance to your family and loved ones, reducing the emotional burden of decision-making during critical moments.  Vision: Annual vision screenings are recommended for early detection of glaucoma, cataracts, and diabetic retinopathy. These exams can also reveal signs of chronic conditions such as diabetes and high blood pressure.  Dental: Annual dental screenings help detect early signs of oral cancer, gum disease, and other conditions linked to overall health, including heart disease and diabetes.  Please see the attached documents for additional preventive care recommendations.

## 2024-02-25 NOTE — Progress Notes (Signed)
 Because this visit was a virtual/telehealth visit,  certain criteria was not obtained, such a blood pressure, CBG if applicable, and timed get up and go. Any medications not marked as taking were not mentioned during the medication reconciliation part of the visit. Any vitals not documented were not able to be obtained due to this being a telehealth visit or patient was unable to self-report a recent blood pressure reading due to a lack of equipment at home via telehealth. Vitals that have been documented are verbally provided by the patient.  This visit was performed by a medical professional under my direct supervision. I was immediately available for consultation/collaboration. I have reviewed and agree with the Annual Wellness Visit documentation.  Subjective:   Emily [name] is a 73 y.o. who presents for a Medicare Wellness preventive visit.  As a reminder, Annual Wellness Visits don't include a physical exam, and some assessments may be limited, especially if this visit is performed virtually. We may recommend an in-person follow-up visit with your provider if needed.  Visit Complete: Virtual I connected with  Emily [name] on 02/25/24 by a audio enabled telemedicine application and verified that I am speaking with the correct person using two identifiers.  Patient Location: Home  Provider Location: Home Office  I discussed the limitations of evaluation and management by telemedicine. The patient expressed understanding and agreed to proceed.  Vital Signs: Because this visit was a virtual/telehealth visit, some criteria may be missing or patient reported. Any vitals not documented were not able to be obtained and vitals that have been documented are patient reported.  VideoDeclined- This patient declined Librarian, academic. Therefore the visit was completed with audio only.  Persons Participating in Visit: Patient.  AWV Questionnaire: No: Patient Medicare  AWV questionnaire was not completed prior to this visit.  Cardiac Risk Factors include: advanced age (>92men, >80 women);dyslipidemia     Objective:    Today's Vitals   02/25/24 1709  BP: 108/66  Weight: 140 lb (63.5 kg)  Height: 5' 2 (1.575 m)   Body mass index is 25.61 kg/m.     02/25/2024    5:09 PM 08/21/2023    6:13 AM 02/19/2023    2:55 PM 05/22/2022    6:08 PM 01/29/2022    8:48 AM 11/01/2021   11:01 AM 02/25/2021    8:57 AM  Advanced Directives  Does Patient Have a Medical Advance Directive? Yes No No No No No No  Type of Estate agent of [place];Living will        Does patient want to make changes to medical advance directive? No - Patient declined        Copy of Healthcare Power of Attorney in Chart? No - copy requested        Would patient like information on creating a medical advance directive?   Yes (MAU/Ambulatory/Procedural Areas - Information given)  No - Patient declined No - Patient declined Yes (MAU/Ambulatory/Procedural Areas - Information given)    Current Medications (verified) Outpatient Encounter Medications as of 02/25/2024  Medication Sig   atorvastatin  (LIPITOR) 10 MG tablet TAKE 1 TABLET BY MOUTH EVERY DAY   cetirizine  (ZYRTEC  ALLERGY) 10 MG tablet Take 1 tablet (10 mg total) by mouth daily.   diclofenac  Sodium (VOLTAREN  ARTHRITIS PAIN) 1 % GEL Apply 2 g topically 4 (four) times daily.   predniSONE  (STERAPRED UNI-PAK 21 TAB) 10 MG (21) TBPK tablet Take by mouth daily. Take 6 tabs by mouth  daily  for 2 days, then 5 tabs for 2 days, then 4 tabs for 2 days, then 3 tabs for 2 days, 2 tabs for 2 days, then 1 tab by mouth daily for 2 days   triamcinolone  cream (KENALOG ) 0.1 % Apply 1 Application topically 2 (two) times daily.   No facility-administered encounter medications on file as of 02/25/2024.    Allergies (verified) Latex   History: Past Medical History:  Diagnosis Date   Anemia    iron runs low   Anxiety    Anxiety  state, unspecified 11/21/2012   Breast fibrocystic disorder 11/21/2012   Bronchitis    Cataract    Fibroid    History of chicken pox    History of uterine fibroid    Incidental lung nodule, > 3mm and < 8mm 02/15/2012   5mm RUL subpleural nodule found on CXR screening for TB due to h/o TB exposure as a child with positive ppd.  Recheck CT scan in 6-12 mos (March - Sept 2014) Repeat CT scan 01/2013 shows: Stable subpleural nodule in the right upper lobe measuring 5 mm. If this patient is at low risk for lung cancer then no further workup is necessary.  If this patient is at increased risk for lung cancer then foll   Nontraumatic tear of skin    right side of vagina skin since 06-12-15, small amount of blood when wipes   Precordial chest pain 11/21/2012   Normal stress echocardiogram 11/2010  CCTA 07/24/2022: CAC score 0, no CAD   Prediabetes 11/21/2012   STD (sexually transmitted disease)    chlamydia treated, hsv vaginal   Tuberculosis    exposed as a young child   Past Surgical History:  Procedure Laterality Date   BREAST BIOPSY Right 05/28/2016   DENSE STROMAL FIBROSIS    COLONOSCOPY WITH PROPOFOL  N/A 04/24/2016   Procedure: COLONOSCOPY WITH PROPOFOL ;  Surgeon: Gladis MARLA Louder, MD;  Location: [facility];  Service: Endoscopy;  Laterality: N/A;   EYE SURGERY Bilateral    lens replacements   FRACTURE SURGERY Right 2003   fibula   surgery for fibroids  2000   Family History  Problem Relation Age of Onset   Stroke Mother    Hypertension Mother    Arthritis Mother    COPD Father 3       decsd due to lack of oxygen   Hypertension Father    Alcohol abuse Father    Gout Father    Emphysema Father    Diabetes Brother    Alcohol abuse Brother    Throat cancer Brother    Stroke Maternal Grandmother    Depression Paternal Grandmother    Social History   Socioeconomic History   Marital status: Divorced    Spouse name: Not on file   Number of children: 2   Years of education:  Not on file   Highest education level: Some college, no degree  Occupational History   Occupation: Day-care  Tobacco Use   Smoking status: Never   Smokeless tobacco: Never  Vaping Use   Vaping status: Never Used  Substance and Sexual Activity   Alcohol use: No   Drug use: No   Sexual activity: Not on file  Other Topics Concern   Not on file  Social History Narrative   Not on file   Social Drivers of Health   Financial Resource Strain: Low Risk  (02/25/2024)   Overall Financial Resource Strain (CARDIA)    Difficulty of Paying  Living Expenses: Not hard at all  Food Insecurity: Patient Declined (02/25/2024)   Hunger Vital Sign    Worried About Running Out of Food in the Last Year: Patient declined    Ran Out of Food in the Last Year: Patient declined  Transportation Needs: No Transportation Needs (02/25/2024)   PRAPARE - Administrator, Civil Service (Medical): No    Lack of Transportation (Non-Medical): No  Physical Activity: Insufficiently Active (02/25/2024)   Exercise Vital Sign    Days of Exercise per Week: 3 days    Minutes of Exercise per Session: 30 min  Stress: No Stress Concern Present (02/25/2024)   [employer] of Occupational Health - Occupational Stress Questionnaire    Feeling of Stress: Not at all  Social Connections: Moderately Isolated (02/25/2024)   Social Connection and Isolation Panel    Frequency of Communication with Friends and Family: More than three times a week    Frequency of Social Gatherings with Friends and Family: Three times a week    Attends Religious Services: Never    Active Member of Clubs or Organizations: Yes    Attends Banker Meetings: 1 to 4 times per year    Marital Status: Divorced    Tobacco Counseling Counseling given: Not Answered    Clinical Intake:  Pre-visit preparation completed: Yes  Pain : No/denies pain     BMI - recorded: 25.61 Nutritional Status: BMI 25 -29  Overweight Nutritional Risks: None Diabetes: No  Lab Results  Component Value Date   HGBA1C 6.1 11/05/2023   HGBA1C 6.0 10/24/2022   HGBA1C 6.2 (H) 03/28/2022     How often do you need to have someone help you when you read instructions, pamphlets, or other written materials from your doctor or pharmacy?: 1 - Never  Interpreter Needed?: No  Information entered by :: Tierre Netto,CMA   Activities of Daily Living     02/25/2024    5:12 PM  In your present state of health, do you have any difficulty performing the following activities:  Hearing? 0  Vision? 0  Difficulty concentrating or making decisions? 0  Walking or climbing stairs? 0  Dressing or bathing? 0  Doing errands, shopping? 0  Preparing Food and eating ? N  Using the Toilet? N  In the past six months, have you accidently leaked urine? N  Do you have problems with loss of bowel control? N  Managing your Medications? N  Managing your Finances? N  Housekeeping or managing your Housekeeping? N    Patient Care Team: Vicci Barnie NOVAK, MD as PCP - General (Internal Medicine) Jeffrie Oneil BROCKS, MD as PCP - Cardiology (Cardiology) Melodi Lerner, MD as Consulting Physician (Orthopedic Surgery) Vicci Gladis POUR, MD as Consulting Physician (Gastroenterology)  I have updated your Care Teams any recent Medical Services you may have received from other providers in the past year.     Assessment:   This is a routine wellness examination for Emily [name].  Hearing/Vision screen Hearing Screening - Comments:: Patient has no difficulties  Vision Screening - Comments:: Patient states states she has do difficulties   Goals Addressed             This Visit's Progress    Patient Stated       Patient would like to improve her diet        Depression Screen     02/25/2024    5:13 PM 07/16/2023    4:18 PM 03/21/2023  2:18 PM 02/19/2023    2:51 PM 10/24/2022    3:23 PM 06/26/2022   11:00 AM 03/28/2022    3:05 PM   PHQ 2/9 Scores  PHQ - 2 Score 0 0 0 0 0 0 0  PHQ- 9 Score 0 0 0  0 2     Fall Risk     02/25/2024    5:11 PM 07/16/2023    4:18 PM 02/19/2023    2:53 PM 10/24/2022    3:51 PM 09/27/2022   10:08 AM  Fall Risk   Falls in the past year? 0 0 0 0 0  Number falls in past yr: 0 0 0 0 0  Injury with Fall? 0 0 0 0 0  Risk for fall due to : No Fall Risks  No Fall Risks No Fall Risks No Fall Risks  Follow up Falls evaluation completed  Falls prevention discussed;Education provided;Falls evaluation completed Falls evaluation completed     MEDICARE RISK AT HOME:  Medicare Risk at Home Any stairs in or around the home?: Yes If so, are there any without handrails?: No Home free of loose throw rugs in walkways, pet beds, electrical cords, etc?: Yes Adequate lighting in your home to reduce risk of falls?: Yes Life alert?: No Use of a cane, walker or w/c?: No Grab bars in the bathroom?: No Shower chair or bench in shower?: Yes Elevated toilet seat or a handicapped toilet?: No  TIMED UP AND GO:  Was the test performed?  No  Cognitive Function: 6CIT completed    01/29/2022    8:53 AM 09/13/2020   10:28 AM  MMSE - Mini Mental State Exam  Orientation to time 5 5  Orientation to Place 5 5  Registration 3 3  Attention/ Calculation 5 5  Recall 3 3  Language- name 2 objects 2 2  Language- repeat 1 1  Language- follow 3 step command 3 3  Language- read & follow direction 1 1  Write a sentence 1 1  Copy design 1 0  Total score 30 29        02/25/2024    5:13 PM 02/19/2023    2:55 PM  6CIT Screen  What Year? 0 points 0 points  What month? 0 points 0 points  What time? 0 points 0 points  Count back from 20 0 points 0 points  Months in reverse 0 points 0 points  Repeat phrase 0 points 0 points  Total Score 0 points 0 points    Immunizations Immunization History  Administered Date(s) Administered   Fluad Quad(high Dose 65+) 02/02/2019, 03/30/2022   Fluad Trivalent(High Dose 65+)  03/21/2023   Influenza,inj,Quad PF,6+ Mos 01/27/2015, 02/16/2017, 01/27/2021   Influenza-Unspecified 01/29/2013, 02/25/2016   Pneumococcal Conjugate-13 01/27/2015   Pneumococcal Polysaccharide-23 03/29/2016   Tdap 06/13/2020   Zoster Recombinant(Shingrix) 07/31/2016, 05/23/2021    Screening Tests Health Maintenance  Topic Date Due   Influenza Vaccine  12/13/2023   COVID-19 Vaccine (1 - 2025-26 season) Never done   Medicare Annual Wellness (AWV)  02/24/2025   Mammogram  12/09/2025   Colonoscopy  04/24/2026   DTaP/Tdap/Td (2 - Td or Tdap) 06/13/2030   Pneumococcal Vaccine: 50+ Years  Completed   DEXA SCAN  Completed   Hepatitis C Screening  Completed   Zoster Vaccines- Shingrix  Completed   Meningococcal B Vaccine  Aged Out    Health Maintenance Items Addressed:patient declined   Additional Screening:  Vision Screening: Recommended annual ophthalmology exams for early detection  of glaucoma and other disorders of the eye. Is the patient up to date with their annual eye exam?  yes  Who is the provider or what is the name of the office in which the patient attends annual eye exams?   Dental Screening: Recommended annual dental exams for proper oral hygiene  Community Resource Referral / Chronic Care Management: CRR required this visit?  No   CCM required this visit?  No   Plan:    I have personally reviewed and noted the following in the patient's chart:   Medical and social history Use of alcohol, tobacco or illicit drugs  Current medications and supplements including opioid prescriptions. Patient is not currently taking opioid prescriptions. Functional ability and status Nutritional status Physical activity Advanced directives List of other physicians Hospitalizations, surgeries, and ER visits in previous 12 months Vitals Screenings to include cognitive, depression, and falls Referrals and appointments  In addition, I have reviewed and discussed with patient  certain preventive protocols, quality metrics, and best practice recommendations. A written personalized care plan for preventive services as well as general preventive health recommendations were provided to patient.   Lyle MARLA Right, [place]   02/25/2024   After Visit Summary: (MyChart) Due to this being a telephonic visit, the after visit summary with patients personalized plan was offered to patient via MyChart   Notes: Nothing significant to report at this time.

## 2024-02-29 NOTE — Progress Notes (Signed)
 The patient attended a screening event on 06/03/23 where her BP  screening results was 142/91. At the event the patient noted she has insurance, has a pcp, doesn't smoke and had a housing insecurity. Per chart review pt does have a pcp, the last office visit is not visible in [facility]. Pt had an appt at [facility] on 05/17/23 referred by her pcp. Chart review did not indicate any future appts in [facility].  CHW made a phone attempt and left a VM for a call back. CHW will attempt a second and third phone attempt at a later time. Pt called back and stated she still has housing insecurity but does not want me to mail or email her any resources. PT stated she has a future pcp appt on 07/31/23 and will inform her pcp on BP results. Hypertension is listed in the pts chart. AmLODipine 10mg  tabs are on the current meds list.   No additional Health equity team support indicated at this time.

## 2024-03-09 ENCOUNTER — Telehealth: Payer: Self-pay | Admitting: Internal Medicine

## 2024-03-09 NOTE — Telephone Encounter (Signed)
-----   Message from Barnie Louder sent at 03/07/2024  7:41 AM EDT ----- Regarding: Appt Over due for f/u visit for chronic disease management.

## 2024-03-09 NOTE — Telephone Encounter (Signed)
 Called patient, unable to reach patient or leave voicemail.

## 2024-03-11 NOTE — Progress Notes (Signed)
 Emily [name]                                          MRN: [id number]   03/11/2024   The [facility] Quality Team Specialist reviewed this patient medical record for the purposes of chart review for care gap closure. The following were reviewed: chart review for care gap closure-kidney health evaluation for diabetes:eGFR  and uACR.    [facility]I Quality Team

## 2024-04-20 ENCOUNTER — Ambulatory Visit: Admitting: Internal Medicine

## 2024-04-20 ENCOUNTER — Other Ambulatory Visit: Payer: Self-pay | Admitting: Internal Medicine

## 2024-04-20 DIAGNOSIS — E782 Mixed hyperlipidemia: Secondary | ICD-10-CM

## 2024-04-20 NOTE — Telephone Encounter (Signed)
 Copied from CRM [id number]. Topic: Clinical - Medication Refill >> Apr 20, 2024  9:13 AM Avram MATSU wrote: Medication: atorvastatin  (LIPITOR) 10 MG tablet [[id number]]  Has the patient contacted their pharmacy? Yes (Agent: If no, request that the patient contact the pharmacy for the refill. If patient does not wish to contact the pharmacy document the reason why and proceed with request.) (Agent: If yes, when and what did the pharmacy advise?)  This is the patient's preferred pharmacy:  [id number] - [place], [place] - [address] AT [facility] [address] [place] [address] Phone: [phone] Fax: [phone]  Is this the correct pharmacy for this prescription? Yes If no, delete pharmacy and type the correct one.   Has the prescription been filled recently? No  Is the patient out of the medication? No  Has the patient been seen for an appointment in the last year OR does the patient have an upcoming appointment? Yes  Can we respond through MyChart? Yes  Agent: Please be advised that Rx refills may take up to 3 business days. We ask that you follow-up with your pharmacy.

## 2024-04-24 ENCOUNTER — Ambulatory Visit: Payer: Self-pay

## 2024-04-24 ENCOUNTER — Telehealth: Payer: Self-pay

## 2024-04-24 ENCOUNTER — Ambulatory Visit: Attending: Nurse Practitioner | Admitting: Nurse Practitioner

## 2024-04-24 ENCOUNTER — Encounter: Payer: Self-pay | Admitting: Nurse Practitioner

## 2024-04-24 VITALS — BP 112/67 | HR 81 | Temp 98.3°F | Ht 62.0 in | Wt 143.2 lb

## 2024-04-24 DIAGNOSIS — J069 Acute upper respiratory infection, unspecified: Secondary | ICD-10-CM

## 2024-04-24 LAB — POC COVID19/FLU A&B COMBO
Covid Antigen, POC: NEGATIVE
Influenza A Antigen, POC: NEGATIVE
Influenza B Antigen, POC: NEGATIVE

## 2024-04-24 MED ORDER — LEVOCETIRIZINE DIHYDROCHLORIDE 5 MG PO TABS: 5.0000 mg | ORAL_TABLET | Freq: Every evening | ORAL | 1 refills | Status: AC

## 2024-04-24 MED ORDER — BENZONATATE 100 MG PO CAPS: 100.0000 mg | ORAL_CAPSULE | Freq: Two times a day (BID) | ORAL | 0 refills | Status: AC | PRN

## 2024-04-24 MED ORDER — PROMETHAZINE-DM 6.25-15 MG/5ML PO SYRP: 5.0000 mL | ORAL_SOLUTION | Freq: Every evening | ORAL | 0 refills | Status: AC | PRN

## 2024-04-24 MED ORDER — IPRATROPIUM BROMIDE 0.03 % NA SOLN: 2.0000 | Freq: Two times a day (BID) | NASAL | 0 refills | Status: AC

## 2024-04-24 NOTE — Telephone Encounter (Signed)
 FYI Only or Action Required?: FYI only for provider: appointment scheduled on 04/24/24.  Patient was last seen in primary care on 07/16/2023 by Celestia Rosaline SQUIBB, NP.  Called Nurse Triage reporting Cough.  Symptoms began a week ago.  Interventions attempted: OTC medications: mucinex.  Symptoms are: unchanged.  Triage Disposition: See HCP Within 4 Hours (Or PCP Triage)  Patient/caregiver understands and will follow disposition?: Yes   Copied from CRM #[id number]. Topic: Clinical - Red Word Triage >> Apr 24, 2024 10:37 AM Wess RAMAN wrote: Red Word that prompted transfer to Nurse Triage: cough up mucus for 30 years, causing difficulty sleeping. Chills, funny feeling in head causing her to be unbalanced. Reason for Disposition  [1] MILD difficulty breathing (e.g., minimal/no SOB at rest, SOB with walking, pulse < 100) AND [2] still present when not coughing  Answer Assessment - Initial Assessment Questions Pt called in stating she has had congestion x1 week with clear mucous, denies blood in sputum. Denies fever but reports chills this morning. States that she has had issues with congestion x 30 years. Mentioned discussion about a breathing test? But that it was not discussed with PCP. Pt unable to provide any details. Pt states that cough is worse when she goes into crowds; states she starts coughing, has mild SOB and feels hot. States that she is currently taking mucinex and was feeling better but congestion is back now. 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 Based on appt availability, scheduled with soonest available. Appointment scheduled for evaluation. Patient agrees with plan of care, and will call back if anything changes, or if symptoms worsen.    1. ONSET: When did the cough begin?      Last week, last Saturday 12/06  2. SEVERITY: How bad is the cough today?      Worse when she is in  crowded areas; okay today   3. SPUTUM: Describe the color of your sputum (e.g., none, dry cough; clear, white, yellow, green)     Clear  4. HEMOPTYSIS: Are you coughing up any blood? If Yes, ask: How much? (e.g., flecks, streaks, tablespoons, etc.)     No   5. DIFFICULTY BREATHING: Are you having difficulty breathing? If Yes, ask: How bad is it? (e.g., mild, moderate, severe)      None   6. FEVER: Do you have a fever? If Yes, ask: What is your temperature, how was it measured, and when did it start?     None; chills today   10. OTHER SYMPTOMS: Do you have any other symptoms? (e.g., runny nose, wheezing, chest pain)       Difficulty sleeping; runny nose has subsided  Protocols used: Cough - Acute Productive-A-AH

## 2024-04-24 NOTE — Progress Notes (Signed)
 Assessment & Plan:  Krystina was seen today for nasal congestion and cough.  Diagnoses and all orders for this visit:  URI with cough and congestion -     levocetirizine (XYZAL) 5 MG tablet; Take 1 tablet (5 mg total) by mouth every evening. -     ipratropium (ATROVENT) 0.03 % nasal spray; Place 2 sprays into both nostrils every 12 (twelve) hours. -     promethazine-dextromethorphan (PROMETHAZINE-DM) 6.25-15 MG/5ML syrup; Take 5 mLs by mouth at bedtime as needed for cough. -     benzonatate  (TESSALON ) 100 MG capsule; Take 1 capsule (100 mg total) by mouth 2 (two) times daily as needed for cough.    Patient has been counseled on age-appropriate routine health concerns for screening and prevention. These are reviewed and up-to-date. Referrals have been placed accordingly. Immunizations are up-to-date or declined.    Subjective:   Chief Complaint  Patient presents with   Nasal Congestion    Patient states she has had congestion x1 week with clear mucous, denies blood in sputum. Denies fever,  but reports chills this morning.    Cough    OTC mucinex has taken with some relief, but symptoms have returned.     Emily [name] 73 y.o. female presents to office today with symptoms of URI  She has been around other sick contacts. Symptoms currently include: cough, chills, nasal congestion  Onset of symptoms of 7 days ago.  Taking mucinex and benadryl  at home with little relief of symptoms. Has not taken a COVID test.     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  Recheck CT scan in 6-12 mos (March - Sept  2014) Repeat CT scan 01/2013 shows: Stable subpleural nodule in the right upper lobe measuring 5 mm. If this patient is at low risk for lung cancer then no further workup is necessary.  If this patient is at increased risk for lung cancer then foll   Nontraumatic tear of skin    right side of vagina skin since 06-12-15, small amount of blood when wipes   Precordial chest pain 11/21/2012   Normal stress echocardiogram 11/2010  CCTA 07/24/2022: CAC score 0, no CAD   Prediabetes 11/21/2012   STD (sexually transmitted disease)    chlamydia treated, hsv vaginal   Tuberculosis    exposed as a young child    Past Surgical History:  Procedure Laterality Date   BREAST BIOPSY Right 05/28/2016   DENSE STROMAL FIBROSIS    COLONOSCOPY WITH PROPOFOL  N/A 04/24/2016   Procedure: COLONOSCOPY WITH PROPOFOL ;  Surgeon: Gladis MARLA Louder, MD;  Location: [facility];  Service: Endoscopy;  Laterality: N/A;   EYE SURGERY Bilateral    lens replacements   FRACTURE SURGERY Right 2003   fibula   surgery for fibroids  2000    Family History  Problem Relation Age of Onset   Stroke Mother    Hypertension Mother    Arthritis Mother    COPD Father 37  decsd due to lack of oxygen   Hypertension Father    Alcohol abuse Father    Gout Father    Emphysema Father    Diabetes Brother    Alcohol abuse Brother    Throat cancer Brother    Stroke Maternal Grandmother    Depression Paternal Grandmother     Social History Reviewed with no changes to be made today.   Outpatient Medications Prior to Visit  Medication Sig Dispense Refill   atorvastatin  (LIPITOR) 10 MG tablet TAKE 1 TABLET BY MOUTH EVERY DAY 90 tablet 0   diclofenac  Sodium (VOLTAREN  ARTHRITIS PAIN) 1 % GEL Apply 2 g topically 4 (four) times daily. (Patient not taking: Reported on 04/24/2024) 100 g 1   triamcinolone  cream (KENALOG ) 0.1 % Apply 1 Application topically 2 (two) times daily. (Patient not taking: Reported on 04/24/2024) 30 g 0    cetirizine  (ZYRTEC  ALLERGY) 10 MG tablet Take 1 tablet (10 mg total) by mouth daily. (Patient not taking: Reported on 04/24/2024) 30 tablet 11   predniSONE  (STERAPRED UNI-PAK 21 TAB) 10 MG (21) TBPK tablet Take by mouth daily. Take 6 tabs by mouth daily  for 2 days, then 5 tabs for 2 days, then 4 tabs for 2 days, then 3 tabs for 2 days, 2 tabs for 2 days, then 1 tab by mouth daily for 2 days 42 tablet 0   No facility-administered medications prior to visit.    Allergies[1]     Objective:    BP 112/67 (BP Location: Left Arm, Patient Position: Sitting, Cuff Size: Normal)   Pulse 81   Temp 98.3 F (36.8 C) (Oral)   Ht 5' 2 (1.575 m)   Wt 143 lb 3.2 oz (65 kg)   SpO2 98%   BMI 26.19 kg/m  Wt Readings from Last 3 Encounters:  04/24/24 143 lb 3.2 oz (65 kg)  02/25/24 140 lb (63.5 kg)  07/16/23 144 lb (65.3 kg)    Physical Exam Constitutional:      Appearance: She is well-developed. She is not ill-appearing.  HENT:     Head: Normocephalic.     Right Ear: Hearing, ear canal and external ear normal. No middle ear effusion.     Left Ear: Hearing, ear canal and external ear normal.  No middle ear effusion.     Nose: Mucosal edema, congestion and rhinorrhea present.     Right Sinus: No maxillary sinus tenderness or frontal sinus tenderness.     Left Sinus: No maxillary sinus tenderness or frontal sinus tenderness.     Mouth/Throat:     Pharynx: No oropharyngeal exudate or posterior oropharyngeal erythema.     Tonsils: No tonsillar abscesses.  Neck:     Thyroid : No thyromegaly.  Cardiovascular:     Rate and Rhythm: Normal rate and regular rhythm.     Heart sounds: No murmur heard.    No friction rub. No gallop.  Pulmonary:     Effort: Pulmonary effort is normal. No respiratory distress.     Breath sounds: Normal breath sounds. No wheezing or rales.  Chest:     Chest wall: No tenderness.  Musculoskeletal:        General: Normal range of motion.     Cervical back: Normal range  of motion.  Lymphadenopathy:     Cervical: No cervical adenopathy.  Skin:    General: Skin is warm and dry.  Neurological:     Mental Status: She is alert and oriented to person, place, and time.  Psychiatric:        Behavior: Behavior normal.        Thought Content: Thought content normal.        Judgment: Judgment normal.          Patient has been counseled extensively about nutrition and exercise as well as the importance of adherence with medications and regular follow-up. The patient was given clear instructions to go to ER or return to medical center if symptoms don't improve, worsen or new problems develop. The patient verbalized understanding.   Follow-up: Return in about 3 months (around 07/23/2024) for with PCP.   Haze LELON Servant, FNP-BC [facility] [place], [place] [phone]   04/24/2024, 1:56 PM    [1]  Allergies Allergen Reactions   Latex Itching and Rash

## 2024-04-24 NOTE — Addendum Note (Signed)
 Addended by: LIANE JACKOLYN GAILS on: 04/24/2024 02:04 PM   Modules accepted: Orders

## 2024-04-24 NOTE — Telephone Encounter (Signed)
 Copied from CRM #[id number]. Topic: General - Other >> Apr 24, 2024 10:37 AM Wess RAMAN wrote: Reason for CRM: Patient would like to know when her last pap smear was.  Callback #: [phone]

## 2024-04-24 NOTE — Telephone Encounter (Signed)
Noted, patient has been scheduled.

## 2024-04-27 ENCOUNTER — Ambulatory Visit: Payer: Self-pay | Admitting: Nurse Practitioner

## 2024-04-27 NOTE — Telephone Encounter (Signed)
 Called but no answer. LVM to call back.

## 2024-04-28 NOTE — Telephone Encounter (Signed)
 Copied from CRM #[id number]. Topic: General - Other >> Apr 28, 2024  3:05 PM Antony RAMAN wrote:  Reason for CRM: returning a missed call, please call her back

## 2024-04-28 NOTE — Telephone Encounter (Signed)
 Called but no answer. LVM to call back.

## 2024-04-29 NOTE — Telephone Encounter (Signed)
 Called but no answer. LVM to call back.

## 2024-04-30 NOTE — Telephone Encounter (Signed)
 Called & spoke to the patient. Verified name & DOB. Inquired if patient is trying to confirm last pap smear. Patient declined and said that the question is why why is not getting a pap smear. She recently spoke about it with PCP but has received a letter from her insurance letting her know that she is due for one. Patient stated that her sister who is older than her gets one yearly and is concerned as wo thy she is not being scheduled for one due to previous history of an abnormal pap smear. Please advise.

## 2024-04-30 NOTE — Telephone Encounter (Signed)
 Last pap was 09/2013 and it was normal.  Pap smear no longer recommended after age 75 especially if last one leading up to that age was normal. If she wants to continue having pap, she can see a gynecologist about it but he or she will say the same thing.

## 2024-04-30 NOTE — Telephone Encounter (Signed)
 Copied from CRM #[id number]. Topic: General - Other >> Apr 28, 2024  3:05 PM Antony RAMAN wrote:  Reason for CRM: returning a missed call, please call her back  >> Apr 30, 2024  2:40 PM Selinda RAMAN wrote:  The patient called back returning Clarisa's call and Zara just said to leave another CRM. The patient states she is just returning the call as asked. Please assist patient further

## 2024-05-01 NOTE — Addendum Note (Signed)
 Addended by: VICCI SOBER B on: 05/01/2024 01:43 PM   Modules accepted: Orders

## 2024-05-01 NOTE — Telephone Encounter (Signed)
 Called & spoke to the patient. Verified name & DOB. Informed of Dr.Johnson's message below. Patient is requesting a referral to be sent to gynecology due to fibroid tumors that were found in 2011. Informed patient that she will be able to speak to the gynecologist more in depth about it. Patient expressed verbal understanding.  Please  submit a referral.

## 2024-06-12 ENCOUNTER — Telehealth (INDEPENDENT_AMBULATORY_CARE_PROVIDER_SITE_OTHER): Admitting: Nurse Practitioner

## 2024-06-12 ENCOUNTER — Ambulatory Visit: Payer: Self-pay

## 2024-06-12 DIAGNOSIS — F411 Generalized anxiety disorder: Secondary | ICD-10-CM

## 2024-06-12 NOTE — Progress Notes (Signed)
 Not seen

## 2024-06-12 NOTE — Telephone Encounter (Signed)
 FYI Only or Action Required?: FYI only for provider: appointment scheduled on Monday. Pt is looking to take FMLA in order to reduce her obligations and reduce her anxiety. Pt has a lot of family issues.  Patient was last seen in primary care on 06/12/2024 by Paseda, Folashade R, FNP.  Called Nurse Triage reporting Anxiety.  Symptoms began ongoing for a long time - worsening the past few weeks d/t family obligations.  Interventions attempted: Nothing.  Symptoms are: gradually worsening.  Triage Disposition: See PCP When Office is Open (Within 3 Days)  Patient/caregiver understands and will follow disposition?: Yes                             Reason for Triage: Severe anxiety - not feeling well since dec. this week has been very overwhelming.   Reason for Disposition  MODERATE anxiety (e.g., persistent or frequent anxiety symptoms; interferes with sleep, school, or work)  Answer Assessment - Initial Assessment Questions 1. CONCERN: Did anything happen that prompted you to call today?      Over whelmed 2. ANXIETY SYMPTOMS: Can you describe how you (your loved one; patient) have been feeling? (e.g., tense, restless, panicky, anxious, keyed up, overwhelmed, sense of impending doom).      Awhile now 3. ONSET: How long have you been feeling this way? (e.g., hours, days, weeks)      Last week - Actual a very long time 4. SEVERITY: How would you rate the level of anxiety? (e.g., 0 - 10; or mild, moderate, severe).     Not too bad right now 5. FUNCTIONAL IMPAIRMENT: How have these feelings affected your ability to do daily activities? Have you had more difficulty than usual doing your normal daily activities? (e.g., getting better, same, worse; self-care, school, work, interactions)     none 6. HISTORY: Have you felt this way before? Have you ever been diagnosed with an anxiety problem in the past? (e.g., generalized anxiety disorder, panic attacks,  PTSD). If Yes, ask: How was this problem treated? (e.g., medicines, counseling, etc.)     yes 7. RISK OF HARM - SUICIDAL IDEATION: Do you ever have thoughts of hurting or killing yourself? If Yes, ask:  Do you have these feelings now? Do you have a plan on how you would do this?     no 8. TREATMENT:  What has been done so far to treat this anxiety? (e.g., medicines, relaxation strategies). What has helped?     no 9. THERAPIST: Do you have a counselor or therapist? If Yes, ask: What is their name?     no  11. PATIENT SUPPORT: Who is with you now? Who do you live with? Do you have family or friends who you can talk to?        friends 12. OTHER SYMPTOMS: Do you have any other symptoms? (e.g., feeling depressed, trouble concentrating, trouble sleeping, trouble breathing, palpitations or fast heartbeat, chest pain, sweating, nausea, or diarrhea)       Needs time  Protocols used: Anxiety and Panic Attack-A-AH

## 2024-06-15 ENCOUNTER — Telehealth: Payer: Self-pay

## 2024-06-15 ENCOUNTER — Ambulatory Visit: Payer: Self-pay | Admitting: Critical Care Medicine

## 2024-06-15 NOTE — Telephone Encounter (Signed)
 Contacted pt to cancel and reschedule appt due to the weather pt states she will call back to reschedule

## 2024-07-23 ENCOUNTER — Ambulatory Visit: Payer: Self-pay | Admitting: Internal Medicine
